# Patient Record
Sex: Male | Born: 1955 | Race: White | Hispanic: No | Marital: Married | Smoking: Former smoker
Health system: Southern US, Community
[De-identification: ages and names within clinical notes are randomized; demographics above are authoritative.]

## PROBLEM LIST (undated history)

## (undated) DIAGNOSIS — M1990 Unspecified osteoarthritis, unspecified site: Secondary | ICD-10-CM

## (undated) DIAGNOSIS — D649 Anemia, unspecified: Secondary | ICD-10-CM

## (undated) DIAGNOSIS — J309 Allergic rhinitis, unspecified: Secondary | ICD-10-CM

## (undated) DIAGNOSIS — T7840XA Allergy, unspecified, initial encounter: Secondary | ICD-10-CM

## (undated) DIAGNOSIS — D509 Iron deficiency anemia, unspecified: Secondary | ICD-10-CM

## (undated) DIAGNOSIS — K635 Polyp of colon: Secondary | ICD-10-CM

## (undated) DIAGNOSIS — K2270 Barrett's esophagus without dysplasia: Secondary | ICD-10-CM

## (undated) DIAGNOSIS — B019 Varicella without complication: Secondary | ICD-10-CM

## (undated) DIAGNOSIS — I10 Essential (primary) hypertension: Secondary | ICD-10-CM

## (undated) DIAGNOSIS — K219 Gastro-esophageal reflux disease without esophagitis: Secondary | ICD-10-CM

## (undated) DIAGNOSIS — G43909 Migraine, unspecified, not intractable, without status migrainosus: Secondary | ICD-10-CM

## (undated) DIAGNOSIS — H269 Unspecified cataract: Secondary | ICD-10-CM

## (undated) DIAGNOSIS — E785 Hyperlipidemia, unspecified: Secondary | ICD-10-CM

## (undated) DIAGNOSIS — R42 Dizziness and giddiness: Secondary | ICD-10-CM

## (undated) DIAGNOSIS — Z972 Presence of dental prosthetic device (complete) (partial): Secondary | ICD-10-CM

## (undated) HISTORY — DX: Allergy, unspecified, initial encounter: T78.40XA

## (undated) HISTORY — DX: Hyperlipidemia, unspecified: E78.5

## (undated) HISTORY — DX: Iron deficiency anemia, unspecified: D50.9

## (undated) HISTORY — DX: Migraine, unspecified, not intractable, without status migrainosus: G43.909

## (undated) HISTORY — DX: Anemia, unspecified: D64.9

## (undated) HISTORY — DX: Varicella without complication: B01.9

## (undated) HISTORY — DX: Polyp of colon: K63.5

## (undated) HISTORY — PX: UPPER GASTROINTESTINAL ENDOSCOPY: SHX188

## (undated) HISTORY — DX: Essential (primary) hypertension: I10

## (undated) HISTORY — DX: Allergic rhinitis, unspecified: J30.9

## (undated) HISTORY — PX: POLYPECTOMY: SHX149

## (undated) HISTORY — DX: Barrett's esophagus without dysplasia: K22.70

## (undated) HISTORY — PX: ESOPHAGOGASTRODUODENOSCOPY ENDOSCOPY: SHX5814

## (undated) HISTORY — DX: Unspecified cataract: H26.9

## (undated) HISTORY — DX: Unspecified osteoarthritis, unspecified site: M19.90

## (undated) HISTORY — DX: Gastro-esophageal reflux disease without esophagitis: K21.9

## (undated) HISTORY — PX: COLONOSCOPY: SHX174

---

## 2005-05-02 ENCOUNTER — Ambulatory Visit: Payer: Self-pay | Admitting: Family Medicine

## 2005-05-02 ENCOUNTER — Other Ambulatory Visit: Payer: Self-pay

## 2005-06-28 ENCOUNTER — Ambulatory Visit: Payer: Self-pay | Admitting: Internal Medicine

## 2005-09-29 ENCOUNTER — Encounter: Payer: Self-pay | Admitting: Internal Medicine

## 2005-10-23 ENCOUNTER — Encounter: Payer: Self-pay | Admitting: Internal Medicine

## 2008-01-20 ENCOUNTER — Ambulatory Visit: Payer: Self-pay | Admitting: General Practice

## 2008-01-22 ENCOUNTER — Ambulatory Visit: Payer: Self-pay | Admitting: General Practice

## 2008-02-21 ENCOUNTER — Ambulatory Visit: Payer: Self-pay | Admitting: General Practice

## 2008-03-23 ENCOUNTER — Ambulatory Visit: Payer: Self-pay | Admitting: General Practice

## 2009-12-30 ENCOUNTER — Ambulatory Visit: Payer: Self-pay

## 2010-12-23 ENCOUNTER — Ambulatory Visit: Payer: Self-pay | Admitting: Rheumatology

## 2011-02-06 ENCOUNTER — Encounter: Payer: Self-pay | Admitting: Rheumatology

## 2011-02-21 ENCOUNTER — Encounter: Payer: Self-pay | Admitting: Rheumatology

## 2011-12-05 ENCOUNTER — Other Ambulatory Visit: Payer: Self-pay | Admitting: Family Medicine

## 2011-12-05 LAB — CBC WITH DIFFERENTIAL/PLATELET
Basophil %: 0.8 %
Eosinophil #: 1.2 10*3/uL — ABNORMAL HIGH (ref 0.0–0.7)
Eosinophil %: 14.1 %
HCT: 40 % (ref 40.0–52.0)
HGB: 13.1 g/dL (ref 13.0–18.0)
Lymphocyte #: 2.9 10*3/uL (ref 1.0–3.6)
MCH: 26.8 pg (ref 26.0–34.0)
MCHC: 32.6 g/dL (ref 32.0–36.0)
MCV: 82 fL (ref 80–100)
Monocyte #: 0.6 10*3/uL (ref 0.0–0.7)
Monocyte %: 6.5 %
Neutrophil #: 4 10*3/uL (ref 1.4–6.5)
RBC: 4.86 10*6/uL (ref 4.40–5.90)

## 2011-12-05 LAB — URINALYSIS, COMPLETE
Bilirubin,UR: NEGATIVE
Glucose,UR: NEGATIVE mg/dL (ref 0–75)
Ketone: NEGATIVE
Leukocyte Esterase: NEGATIVE
Nitrite: NEGATIVE
Ph: 5 (ref 4.5–8.0)
Protein: NEGATIVE
RBC,UR: 1 /HPF (ref 0–5)
Squamous Epithelial: <1

## 2011-12-05 LAB — COMPREHENSIVE METABOLIC PANEL
Alkaline Phosphatase: 57 U/L (ref 50–136)
BUN: 10 mg/dL (ref 7–18)
Bilirubin,Total: 0.6 mg/dL (ref 0.2–1.0)
Calcium, Total: 9.5 mg/dL (ref 8.5–10.1)
Chloride: 107 mmol/L (ref 98–107)
Creatinine: 0.92 mg/dL (ref 0.60–1.30)
EGFR (African American): >60
EGFR (Non-African Amer.): >60
Potassium: 4 mmol/L (ref 3.5–5.1)
SGOT(AST): 30 U/L (ref 15–37)
SGPT (ALT): 40 U/L
Total Protein: 7.7 g/dL (ref 6.4–8.2)

## 2015-05-02 ENCOUNTER — Other Ambulatory Visit: Payer: Self-pay | Admitting: Family Medicine

## 2015-06-01 ENCOUNTER — Telehealth: Payer: Self-pay | Admitting: Family Medicine

## 2015-06-01 NOTE — Telephone Encounter (Signed)
Calling regarding PA for celecoxib.

## 2015-06-01 NOTE — Telephone Encounter (Signed)
I have not received a PA as of yet today

## 2015-06-04 ENCOUNTER — Other Ambulatory Visit: Payer: Self-pay | Admitting: Family Medicine

## 2015-06-27 ENCOUNTER — Other Ambulatory Visit: Payer: Self-pay | Admitting: Family Medicine

## 2015-10-15 ENCOUNTER — Other Ambulatory Visit: Payer: Self-pay | Admitting: Family Medicine

## 2015-11-30 ENCOUNTER — Other Ambulatory Visit: Payer: Self-pay | Admitting: Family Medicine

## 2015-12-21 ENCOUNTER — Other Ambulatory Visit: Payer: Self-pay | Admitting: Family Medicine

## 2015-12-28 ENCOUNTER — Encounter: Payer: Self-pay | Admitting: Family Medicine

## 2015-12-28 ENCOUNTER — Ambulatory Visit (INDEPENDENT_AMBULATORY_CARE_PROVIDER_SITE_OTHER): Payer: BLUE CROSS/BLUE SHIELD | Admitting: Family Medicine

## 2015-12-28 VITALS — BP 126/88 | HR 80 | Temp 98.0°F | Ht 69.0 in | Wt 215.4 lb

## 2015-12-28 DIAGNOSIS — E785 Hyperlipidemia, unspecified: Secondary | ICD-10-CM | POA: Insufficient documentation

## 2015-12-28 DIAGNOSIS — M1990 Unspecified osteoarthritis, unspecified site: Secondary | ICD-10-CM

## 2015-12-28 DIAGNOSIS — J3089 Other allergic rhinitis: Secondary | ICD-10-CM | POA: Diagnosis not present

## 2015-12-28 DIAGNOSIS — R350 Frequency of micturition: Secondary | ICD-10-CM | POA: Diagnosis not present

## 2015-12-28 DIAGNOSIS — J309 Allergic rhinitis, unspecified: Secondary | ICD-10-CM | POA: Insufficient documentation

## 2015-12-28 DIAGNOSIS — I10 Essential (primary) hypertension: Secondary | ICD-10-CM | POA: Insufficient documentation

## 2015-12-28 LAB — POCT URINALYSIS DIPSTICK
Bilirubin, UA: NEGATIVE
Blood, UA: NEGATIVE
Glucose, UA: NEGATIVE
Ketones, UA: NEGATIVE
LEUKOCYTES UA: NEGATIVE
NITRITE UA: NEGATIVE
PH UA: 7
PROTEIN UA: NEGATIVE
Spec Grav, UA: 1.01
Urobilinogen, UA: 0.2

## 2015-12-28 LAB — COMPREHENSIVE METABOLIC PANEL
ALBUMIN: 5 g/dL (ref 3.5–5.2)
ALT: 31 U/L (ref 0–53)
AST: 22 U/L (ref 0–37)
Alkaline Phosphatase: 86 U/L (ref 39–117)
BUN: 10 mg/dL (ref 6–23)
CHLORIDE: 103 meq/L (ref 96–112)
CO2: 26 meq/L (ref 19–32)
Calcium: 10.6 mg/dL — ABNORMAL HIGH (ref 8.4–10.5)
Creatinine, Ser: 0.93 mg/dL (ref 0.40–1.50)
GFR: 88.51 mL/min (ref >60.00)
Glucose, Bld: 105 mg/dL — ABNORMAL HIGH (ref 70–99)
POTASSIUM: 3.9 meq/L (ref 3.5–5.1)
SODIUM: 140 meq/L (ref 135–145)
Total Bilirubin: 0.7 mg/dL (ref 0.2–1.2)
Total Protein: 8 g/dL (ref 6.0–8.3)

## 2015-12-28 LAB — LIPID PANEL
Cholesterol: 181 mg/dL (ref 0–200)
HDL: 56.8 mg/dL (ref >39.00)
LDL Cholesterol: 89 mg/dL (ref 0–99)
NonHDL: 124.69
TRIGLYCERIDES: 177 mg/dL — AB (ref 0.0–149.0)
Total CHOL/HDL Ratio: 3
VLDL: 35.4 mg/dL (ref 0.0–40.0)

## 2015-12-28 LAB — HEMOGLOBIN A1C: HEMOGLOBIN A1C: 5.9 % (ref 4.6–6.5)

## 2015-12-28 MED ORDER — CELECOXIB 200 MG PO CAPS: 200.0000 mg | ORAL_CAPSULE | Freq: Two times a day (BID) | ORAL | Status: DC | PRN

## 2015-12-28 MED ORDER — MONTELUKAST SODIUM 10 MG PO TABS: 10.0000 mg | ORAL_TABLET | Freq: Every day | ORAL | Status: DC

## 2015-12-28 NOTE — Progress Notes (Signed)
Pre visit review using our clinic review tool, if applicable. No additional management support is needed unless otherwise documented below in the visit note. 

## 2015-12-28 NOTE — Assessment & Plan Note (Signed)
At goal. We'll continue current medications.

## 2015-12-28 NOTE — Assessment & Plan Note (Signed)
Not well-controlled. Will continue Zyrtec and Flonase. We'll add Singulair. Continue to monitor.

## 2015-12-28 NOTE — Patient Instructions (Addendum)
Nice to meet you. We will prescribe your Celebrex today. Need to monitor for stomach upset on this. You should always take it with food. We will check some lab work to evaluate urination. We will start you on Singulair for your allergies. If you develop worsening cough, or fevers, shortness of breath, chest pain, worsening urinary issues, abdominal pain, or any new or changing symptoms please seek medical attention.

## 2015-12-28 NOTE — Assessment & Plan Note (Signed)
Scattered areas of osteoarthritis. Unsure if he's ever had imaging. We will continue his Celebrex. He was advised to take this with food. He was advised to monitor for stomach upset. He was advised to continue his PPI while taking this. He will continue to monitor. He is given return precautions.

## 2015-12-28 NOTE — Progress Notes (Signed)
Patient ID: Collin Mills, male   DOB: [birth year], 60 y.o.   MRN: [id number]  Collin Rumps, MD Phone: [phone]  Collin Mills is a 60 y.o. male who presents today for new patient visit.  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 He's been taking Celebrex intermittently for this in addition to Advil. He does note some upset stomach with the Celebrex if he takes it every day. If he takes it every 2-3 days it does not bother him. Notes it helped significantly. Using his joints excessively cause some discomfort.  Frequent urination: Patient notes this is been going on for 20+ years. He additionally notes excessive thirst. He thinks it might be related to medications. No history of diabetes. No urinary stream issues. No dysuria or urgency. No hematuria.  Allergic rhinitis: Patient notes he is allergic to dust mites, mold, trees, and grass among other things. He previously took allergy shots though these did not help. Currently takes Zyrtec and Flonase. He notes he gets itchy watery eyes, rhinorrhea, and diffuse itching. Notes this is somewhat improved with Zyrtec. He notes symptoms are not well controlled on his current regimen.  HYPERTENSION Disease Monitoring Home BP Monitoring infrequently, typically 130s over 80s Chest pain- no    Dyspnea- no Medications Compliance-  taking diltiazem and losartan. Lightheadedness-  no  Edema- no  HYPERLIPIDEMIA Symptoms Chest pain on exertion:  No   Leg claudication:   No Medications: Compliance- taking pravastatin Right upper quadrant pain- no  Muscle aches- no    Active Ambulatory Problems    Diagnosis Date Noted  . Osteoarthritis 12/28/2015  . Allergic rhinitis 12/28/2015  . Frequent urination 12/28/2015  . Essential hypertension 12/28/2015  . Hyperlipidemia 12/28/2015   Resolved Ambulatory Problems    Diagnosis Date Noted  . No Resolved Ambulatory Problems   Past Medical History    Diagnosis Date  . Chickenpox   . GERD (gastroesophageal reflux disease)   . Hypertension   . Migraines     Family History  Problem Relation Age of Onset  . Lung cancer    . Stroke    . Hypertension      Social History   Social History  . Marital Status: Married    Spouse Name: N/A  . Number of Children: N/A  . Years of Education: N/A   Occupational History  . Not on file.   Social History Main Topics  . Smoking status: Former Research scientist (life sciences)  . Smokeless tobacco: Not on file  . Alcohol Use: 0.0 oz/week    0 Standard drinks or equivalent per week  . Drug Use: No  . Sexual Activity: Not on file   Other Topics Concern  . Not on file   Social History Narrative  . No narrative on file    ROS   General:  Negative for nexplained weight loss, fever Skin: Negative for new or changing mole, sore that won't heal HEENT: Negative for trouble hearing, trouble seeing, ringing in ears, mouth sores, hoarseness, change in voice, dysphagia. CV:  Negative for chest pain, dyspnea, edema, palpitations Resp: Negative for cough, dyspnea, hemoptysis GI: Negative for nausea, vomiting, diarrhea, constipation, abdominal pain, melena, hematochezia. GU: Positive for frequent urination, Negative for dysuria, incontinence, urinary hesitance, hematuria, vaginal or penile discharge, polyuria, sexual difficulty, lumps in testicle or breasts MSK: Negative for muscle cramps or aches, joint pain or swelling Neuro: Negative for headaches, weakness, numbness, dizziness, passing out/fainting Psych: Negative for depression, anxiety, memory problems  Endo: Positive for excessive thirst, frequent urination  Objective  Physical Exam Filed Vitals:   12/28/15 0933  BP: 126/88  Pulse: 80  Temp: 98 F (36.7 C)    BP Readings from Last 3 Encounters:  12/28/15 126/88   Wt Readings from Last 3 Encounters:  12/28/15 215 lb 6.4 oz (97.705 kg)    Physical Exam  Constitutional: He is well-developed,  well-nourished, and in no distress.  HENT:  Head: Normocephalic and atraumatic.  Right Ear: External ear normal.  Left Ear: External ear normal.  Mouth/Throat: Oropharynx is clear and moist. No oropharyngeal exudate.  Normal TMs bilaterally  Eyes: Conjunctivae are normal. Pupils are equal, round, and reactive to light.  Neck: Neck supple.  Cardiovascular: Normal rate, regular rhythm and normal heart sounds.  Exam reveals no gallop and no friction rub.   No murmur heard. Pulmonary/Chest: Effort normal and breath sounds normal. No respiratory distress. He has no wheezes. He has no rales.  Abdominal: Soft. Bowel sounds are normal. He exhibits no distension. There is no tenderness. There is no rebound and no guarding.  Musculoskeletal: He exhibits no edema.  Mild DIP joint tenderness with no swelling in bilateral hands, no other joint tenderness, no joint swelling in his hands, bilateral knees with no tenderness, swelling, erythema, or warmth, negative McMurray's bilaterally, no ligamentous laxity  Lymphadenopathy:    He has no cervical adenopathy.  Neurological: He is alert. Gait normal.  Skin: Skin is warm and dry. He is not diaphoretic.  Psychiatric: Mood and affect normal.     Assessment/Plan:   Osteoarthritis Scattered areas of osteoarthritis. Unsure if he's ever had imaging. We will continue his Celebrex. He was advised to take this with food. He was advised to monitor for stomach upset. He was advised to continue his PPI while taking this. He will continue to monitor. He is given return precautions.  Allergic rhinitis Not well-controlled. Will continue Zyrtec and Flonase. We'll add Singulair. Continue to monitor.  Frequent urination Chronic issue. We'll check a UA and A1c. Also check CMP. Given chronicity continue to monitor.  Essential hypertension At goal. We'll continue current medications.  Hyperlipidemia Tolerating medication. We'll check lipid panel today.    Orders  Placed This Encounter  Procedures  . Comp Met (CMET)  . HgB A1c  . Lipid Profile  . POCT Urinalysis Dipstick    Eric Sonnenberg, MD [facility] [place]   

## 2015-12-28 NOTE — Assessment & Plan Note (Signed)
Tolerating medication. We'll check lipid panel today.

## 2015-12-28 NOTE — Assessment & Plan Note (Addendum)
Chronic issue. We'll check a UA and A1c. Also check CMP. Given chronicity continue to monitor.

## 2016-01-02 ENCOUNTER — Other Ambulatory Visit: Payer: Self-pay | Admitting: Family Medicine

## 2016-01-03 ENCOUNTER — Telehealth: Payer: Self-pay

## 2016-01-03 NOTE — Telephone Encounter (Signed)
PA started on Celecoxib on cover my meds.

## 2016-01-04 NOTE — Telephone Encounter (Signed)
PA approved from 01/03/2016-10/22/2038

## 2016-01-11 ENCOUNTER — Other Ambulatory Visit (INDEPENDENT_AMBULATORY_CARE_PROVIDER_SITE_OTHER): Payer: BLUE CROSS/BLUE SHIELD

## 2016-01-11 LAB — CALCIUM: Calcium: 10.2 mg/dL (ref 8.4–10.5)

## 2016-01-12 ENCOUNTER — Encounter: Payer: Self-pay | Admitting: Family Medicine

## 2016-01-27 ENCOUNTER — Other Ambulatory Visit: Payer: Self-pay | Admitting: Family Medicine

## 2016-01-28 ENCOUNTER — Encounter: Payer: Self-pay | Admitting: Family Medicine

## 2016-01-28 ENCOUNTER — Other Ambulatory Visit: Payer: Self-pay | Admitting: Family Medicine

## 2016-01-28 MED ORDER — LOSARTAN POTASSIUM 100 MG PO TABS: ORAL_TABLET | ORAL | Status: DC

## 2016-01-28 NOTE — Telephone Encounter (Signed)
Losartan was sent to you for approval due to historical medication. Please advise referral for arthritis.

## 2016-01-28 NOTE — Telephone Encounter (Signed)
Refill sent to pharmacy.   

## 2016-01-28 NOTE — Telephone Encounter (Signed)
Historical medication. Please advise?

## 2016-02-29 ENCOUNTER — Other Ambulatory Visit: Payer: Self-pay | Admitting: Family Medicine

## 2016-03-07 ENCOUNTER — Encounter: Payer: Self-pay | Admitting: Family Medicine

## 2016-04-03 ENCOUNTER — Encounter: Payer: Self-pay | Admitting: Family Medicine

## 2016-04-03 ENCOUNTER — Ambulatory Visit (INDEPENDENT_AMBULATORY_CARE_PROVIDER_SITE_OTHER): Payer: BLUE CROSS/BLUE SHIELD | Admitting: Family Medicine

## 2016-04-03 VITALS — BP 136/84 | HR 76 | Temp 98.2°F | Ht 69.0 in | Wt 213.2 lb

## 2016-04-03 DIAGNOSIS — E785 Hyperlipidemia, unspecified: Secondary | ICD-10-CM

## 2016-04-03 DIAGNOSIS — I10 Essential (primary) hypertension: Secondary | ICD-10-CM

## 2016-04-03 DIAGNOSIS — Z1211 Encounter for screening for malignant neoplasm of colon: Secondary | ICD-10-CM

## 2016-04-03 DIAGNOSIS — R600 Localized edema: Secondary | ICD-10-CM

## 2016-04-03 DIAGNOSIS — J3089 Other allergic rhinitis: Secondary | ICD-10-CM | POA: Diagnosis not present

## 2016-04-03 DIAGNOSIS — M7989 Other specified soft tissue disorders: Secondary | ICD-10-CM

## 2016-04-03 DIAGNOSIS — R198 Other specified symptoms and signs involving the digestive system and abdomen: Secondary | ICD-10-CM

## 2016-04-03 DIAGNOSIS — R109 Unspecified abdominal pain: Secondary | ICD-10-CM | POA: Diagnosis not present

## 2016-04-03 LAB — COMPREHENSIVE METABOLIC PANEL
ALBUMIN: 4.7 g/dL (ref 3.5–5.2)
ALT: 22 U/L (ref 0–53)
AST: 17 U/L (ref 0–37)
Alkaline Phosphatase: 63 U/L (ref 39–117)
BUN: 10 mg/dL (ref 6–23)
CHLORIDE: 105 meq/L (ref 96–112)
CO2: 27 mEq/L (ref 19–32)
Calcium: 9.7 mg/dL (ref 8.4–10.5)
Creatinine, Ser: 0.95 mg/dL (ref 0.40–1.50)
GFR: 86.28 mL/min (ref >60.00)
GLUCOSE: 97 mg/dL (ref 70–99)
POTASSIUM: 4.1 meq/L (ref 3.5–5.1)
SODIUM: 141 meq/L (ref 135–145)
Total Bilirubin: 0.6 mg/dL (ref 0.2–1.2)
Total Protein: 7.7 g/dL (ref 6.0–8.3)

## 2016-04-03 LAB — CBC
HEMATOCRIT: 42.9 % (ref 39.0–52.0)
Hemoglobin: 14.4 g/dL (ref 13.0–17.0)
MCHC: 33.6 g/dL (ref 30.0–36.0)
MCV: 87.8 fl (ref 78.0–100.0)
Platelets: 278 10*3/uL (ref 150.0–400.0)
RBC: 4.88 Mil/uL (ref 4.22–5.81)
RDW: 14.2 % (ref 11.5–15.5)
WBC: 8.9 10*3/uL (ref 4.0–10.5)

## 2016-04-03 LAB — LIPASE: LIPASE: 26 U/L (ref 11.0–59.0)

## 2016-04-03 LAB — TSH: TSH: 1.47 u[IU]/mL (ref 0.35–4.50)

## 2016-04-03 LAB — SEDIMENTATION RATE: Sed Rate: 1 mm/hr (ref 0–20)

## 2016-04-03 MED ORDER — CETIRIZINE HCL 10 MG PO TABS: 10.0000 mg | ORAL_TABLET | Freq: Every day | ORAL | Status: DC

## 2016-04-03 NOTE — Assessment & Plan Note (Addendum)
Last cholesterol panel well-controlled. Continue current dosing of pravastatin. Could consider changing this medication the future if it continues to make him feel hungry.

## 2016-04-03 NOTE — Assessment & Plan Note (Signed)
At goal. Continue current medications. 

## 2016-04-03 NOTE — Assessment & Plan Note (Signed)
Patient reports a sensation of abdominal fullness. Benign exam today. Could be related to excessive adipose tissue over his stomach. Could be musculoskeletal. We will refer him for colonoscopy as he is due for one. We will obtain lab work as outlined below to work this up further. He is given return precautions.

## 2016-04-03 NOTE — Assessment & Plan Note (Signed)
Suspect swelling he gets is related to being on his feet for prolonged periods of time. No cardiac symptoms. Could be anemia related as he has a history of this. No swelling of this time. Could also be related to diltiazem. We'll check lab work as outlined below. If becomes more frequent could consider changing diltiazem.

## 2016-04-03 NOTE — Patient Instructions (Signed)
Nice to see you. We will refill your Zyrtec. We will obtain some lab work to evaluate the fullness sensation having your stomach. We will refer you to GI for colonoscopy as well. Please continue your current blood pressure medications and cholesterol medications. If you develop abdominal pain, fevers, nausea, vomiting, diarrhea, blood in your stool, or any new or changing symptoms please seek medical attention.

## 2016-04-03 NOTE — Assessment & Plan Note (Signed)
Stable. Refill for Zyrtec given. Does note some itching skin with water getting on his skin. We will check a CBC to ensure no polycythemia. He'll continue his current medications.

## 2016-04-03 NOTE — Progress Notes (Signed)
Pre visit review using our clinic review tool, if applicable. No additional management support is needed unless otherwise documented below in the visit note. 

## 2016-04-03 NOTE — Progress Notes (Signed)
Patient ID: Collin Mills, male   DOB: Jul 19, 1956, 60 y.o.   MRN: [id number]  [name], MD Phone: [phone]  Collin Mills is a 60 y.o. male who presents today for f/u.  HYPERTENSION Disease Monitoring: Blood pressure range-130-140/80-90 Chest pain, palpitations- no      Dyspnea- no Medications: Compliance- taking diltiazem and losartan   Edema- minimal ankle edema infrequently if on his feet for too long resolving with propping legs. No orthopnea or PND. Does note a history of anemia. No thyroid dysfunction, kidney dysfunction, or liver dysfunction to his knowledge.  HYPERLIPIDEMIA Disease Monitoring: See symptoms for Hypertension Medications: Compliance- taking pravastatin every other day due to if he takes it every day makes him ravenous Right upper quadrant pain- no  Muscle aches- no  Allergies: He notes minimally watery eyes. Some rhinorrhea. Notes his skin itches. Zyrtec and Benadryl helps somewhat this. Notes his skin itches particularly if it gets wet. This has been going on for his whole life. He has seen an allergist for evaluation of this and they noted no specific abnormalities.  Abdominal fullness: Patient notes for the last 6-12 months he has had a sensation of a fullness in a band distribution at the level of his umbilicus. Can occur when sitting up or walking. He notes no abdominal pain. Has a bowel movement daily. Does note he has hemorrhoids and does have infrequent minimal bleeding with this. Last colonoscopy was 10 years ago and reports no polyps. No nausea or vomiting. No dysuria. No family history of colon cancer. No early satiety.   PMH: Former smoker   ROS see history of present illness  Objective  Physical Exam Filed Vitals:   04/03/16 1055  BP: 136/84  Pulse: 76  Temp: 98.2 F (36.8 C)    BP Readings from Last 3 Encounters:  04/03/16 136/84  12/28/15 126/88   Wt Readings from Last 3 Encounters:  04/03/16 213 lb 3.2 oz (96.707  kg)  12/28/15 215 lb 6.4 oz (97.705 kg)    Physical Exam  Constitutional: He is well-developed, well-nourished, and in no distress.  HENT:  Head: Normocephalic and atraumatic.  Right Ear: External ear normal.  Left Ear: External ear normal.  Mouth/Throat: Oropharynx is clear and moist. No oropharyngeal exudate.  Normal TMs bilaterally  Eyes: Conjunctivae are normal. Pupils are equal, round, and reactive to light.  Cardiovascular: Normal rate, regular rhythm and normal heart sounds.   Pulmonary/Chest: Effort normal and breath sounds normal.  Abdominal: Soft. Bowel sounds are normal. He exhibits no distension. There is no tenderness. There is no rebound and no guarding.  Musculoskeletal: He exhibits no edema.  Neurological: He is alert. Gait normal.  Skin: Skin is warm and dry. He is not diaphoretic.     Assessment/Plan: Please see individual problem list.  Allergic rhinitis Stable. Refill for Zyrtec given. Does note some itching skin with water getting on his skin. We will check a CBC to ensure no polycythemia. He'll continue his current medications.  Essential hypertension At goal. Continue current medications.  Hyperlipidemia Last cholesterol panel well-controlled. Continue current dosing of pravastatin. Could consider changing this medication the future if it continues to make him feel hungry.  Bilateral lower extremity edema Suspect swelling he gets is related to being on his feet for prolonged periods of time. No cardiac symptoms. Could be anemia related as he has a history of this. No swelling of this time. Could also be related to diltiazem. We'll check lab work as outlined  below. If becomes more frequent could consider changing diltiazem.  Abdominal fullness Patient reports a sensation of abdominal fullness. Benign exam today. Could be related to excessive adipose tissue over his stomach. Could be musculoskeletal. We will refer him for colonoscopy as he is due for one. We  will obtain lab work as outlined below to work this up further. He is given return precautions.    Orders Placed This Encounter  Procedures  . CBC  . Comp Met (CMET)  . Lipase  . Sed Rate (ESR)  . TSH  . Ambulatory referral to Gastroenterology    Referral Priority:  Routine    Referral Type:  Consultation    Referral Reason:  Specialty Services Required    Number of Visits Requested:  1    Meds ordered this encounter  Medications  . cetirizine (ZYRTEC) 10 MG tablet    Sig: Take 1 tablet (10 mg total) by mouth daily.    Dispense:  90 tablet    Refill:  [place], MD [place]

## 2016-04-04 ENCOUNTER — Encounter: Payer: Self-pay | Admitting: Gastroenterology

## 2016-04-29 ENCOUNTER — Other Ambulatory Visit: Payer: Self-pay | Admitting: Family Medicine

## 2016-05-01 ENCOUNTER — Encounter: Payer: Self-pay | Admitting: Family Medicine

## 2016-05-01 ENCOUNTER — Other Ambulatory Visit: Payer: Self-pay | Admitting: Family Medicine

## 2016-05-01 MED ORDER — OMEPRAZOLE 40 MG PO CPDR: 40.0000 mg | DELAYED_RELEASE_CAPSULE | Freq: Every day | ORAL | Status: DC

## 2016-05-01 MED ORDER — PRAVASTATIN SODIUM 40 MG PO TABS: 40.0000 mg | ORAL_TABLET | Freq: Every day | ORAL | Status: DC

## 2016-05-02 ENCOUNTER — Other Ambulatory Visit: Payer: Self-pay | Admitting: Family Medicine

## 2016-05-28 ENCOUNTER — Other Ambulatory Visit: Payer: Self-pay | Admitting: Family Medicine

## 2016-05-29 ENCOUNTER — Telehealth: Payer: Self-pay

## 2016-05-29 ENCOUNTER — Ambulatory Visit (AMBULATORY_SURGERY_CENTER): Payer: BLUE CROSS/BLUE SHIELD

## 2016-05-29 VITALS — Ht 68.5 in | Wt 217.2 lb

## 2016-05-29 DIAGNOSIS — Z1211 Encounter for screening for malignant neoplasm of colon: Secondary | ICD-10-CM

## 2016-05-29 MED ORDER — SUPREP BOWEL PREP KIT 17.5-3.13-1.6 GM/177ML PO SOLN
1.0000 | Freq: Once | ORAL | 0 refills | Status: AC
Start: 2016-05-29 — End: 2016-05-29

## 2016-05-29 NOTE — Telephone Encounter (Signed)
Dr Silverio Decamp,     Pt had colon and egd in 2005 @ [facility] by Leonia Corona.  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  Has not had a repeat EGD since.  Scheduled for colon with you on Monday 8/21st.  Would you like to add EGD to colon if pt agrees?                                            Thanks, Ayeza Therriault//PV

## 2016-05-29 NOTE — Progress Notes (Signed)
No allergies to eggs or soy No past problems with anesthesia No diet meds No home oxygen  Declined emmi

## 2016-05-30 ENCOUNTER — Encounter: Payer: Self-pay | Admitting: Gastroenterology

## 2016-05-30 NOTE — Telephone Encounter (Signed)
Spoke with pt.Ok to have endoscopy with the colonoscopy per Dr Coralee Rud rescheduled for endo/colon on 06/12/16 at 11:00am.Pt was informed he would need to sign consent form for endoscopy.He understood.Reviewed prep instructions.Pt will call back if he has further questions.

## 2016-05-30 NOTE — Telephone Encounter (Signed)
Ok to add EGD. Thanks

## 2016-06-02 ENCOUNTER — Telehealth: Payer: Self-pay | Admitting: Gastroenterology

## 2016-06-02 NOTE — Telephone Encounter (Signed)
Appointment changed to colonoscopy. EGD cancelled. Patient advised to come in at 9:30 am instead of the previously planned time.

## 2016-06-12 ENCOUNTER — Ambulatory Visit (AMBULATORY_SURGERY_CENTER): Payer: BLUE CROSS/BLUE SHIELD | Admitting: Gastroenterology

## 2016-06-12 ENCOUNTER — Encounter: Payer: Self-pay | Admitting: Gastroenterology

## 2016-06-12 ENCOUNTER — Other Ambulatory Visit: Payer: Self-pay | Admitting: Surgical

## 2016-06-12 ENCOUNTER — Encounter: Payer: BLUE CROSS/BLUE SHIELD | Admitting: Gastroenterology

## 2016-06-12 ENCOUNTER — Encounter: Payer: Self-pay | Admitting: Family Medicine

## 2016-06-12 VITALS — BP 122/77 | HR 72 | Temp 99.1°F | Resp 15 | Ht 68.5 in | Wt 217.0 lb

## 2016-06-12 DIAGNOSIS — D123 Benign neoplasm of transverse colon: Secondary | ICD-10-CM | POA: Diagnosis not present

## 2016-06-12 DIAGNOSIS — D124 Benign neoplasm of descending colon: Secondary | ICD-10-CM | POA: Diagnosis not present

## 2016-06-12 DIAGNOSIS — Z1211 Encounter for screening for malignant neoplasm of colon: Secondary | ICD-10-CM | POA: Diagnosis not present

## 2016-06-12 MED ORDER — MONTELUKAST SODIUM 10 MG PO TABS: ORAL_TABLET | ORAL | 0 refills | Status: DC

## 2016-06-12 MED ORDER — SODIUM CHLORIDE 0.9 % IV SOLN: 500.0000 mL | INTRAVENOUS | Status: DC

## 2016-06-12 MED ORDER — DILTIAZEM HCL ER COATED BEADS 240 MG PO CP24: 240.0000 mg | ORAL_CAPSULE | Freq: Every day | ORAL | 0 refills | Status: DC

## 2016-06-12 NOTE — Progress Notes (Signed)
Report given to PACU RN, vss

## 2016-06-12 NOTE — Progress Notes (Signed)
Called to room to assist during endoscopic procedure.  Patient ID and intended procedure confirmed with present staff. Received instructions for my participation in the procedure from the performing physician.  

## 2016-06-12 NOTE — Op Note (Signed)
[facility] Patient Name: Collin Mills Procedure Date: 06/12/2016 10:13 AM MRN: [id number] Endoscopist: Mauri Pole , MD Age: 60 Referring MD:  Date of Birth: 10/06/1956 Gender: Male Account #: [id number] Procedure:                Colonoscopy Indications:              Screening for colorectal malignant neoplasm Medicines:                Monitored Anesthesia Care Procedure:                Pre-Anesthesia Assessment:                           - Prior to the procedure, a History and Physical                            was performed, and patient medications and                            allergies were reviewed. The patient's tolerance of                            previous anesthesia was also reviewed. The risks                            and benefits of the procedure and the sedation                            options and risks were discussed with the patient.                            All questions were answered, and informed consent                            was obtained. Prior Anticoagulants: The patient has                            taken no previous anticoagulant or antiplatelet                            agents. ASA Grade Assessment: II - A patient with                            mild systemic disease. After reviewing the risks                            and benefits, the patient was deemed in                            satisfactory condition to undergo the procedure.                           After obtaining informed consent, the colonoscope  was passed under direct vision. Throughout the                            procedure, the patient's blood pressure, pulse, and                            oxygen saturations were monitored continuously. The                            Model CF-HQ190L [id number]) scope was introduced                            through the anus and advanced to the the cecum,                            identified  by appendiceal orifice and ileocecal                            valve. The colonoscopy was performed without                            difficulty. The patient tolerated the procedure                            well. The quality of the bowel preparation was                            good. The ileocecal valve, appendiceal orifice, and                            rectum were photographed. Scope In: 10:27:05 AM Scope Out: 10:48:08 AM Scope Withdrawal Time: 0 hours 15 minutes 54 seconds  Total Procedure Duration: 0 hours 21 minutes 3 seconds  Findings:                 The perianal and digital rectal examinations were                            normal.                           Two sessile polyps were found in the descending                            colon and transverse colon. The polyps were 5 to 9                            mm in size. These polyps were removed with a hot                            snare. Resection and retrieval were complete.                           Two sessile polyps were found in the descending  colon and transverse colon. The polyps were 4 to 6                            mm in size. These polyps were removed with a cold                            snare. Resection and retrieval were complete.                           Two sessile polyps were found in the descending                            colon. The polyps were 2 to 3 mm in size. These                            polyps were removed with a cold biopsy forceps.                            Resection and retrieval were complete.                           Non-bleeding internal hemorrhoids were found during                            retroflexion. The hemorrhoids were small.                           The exam was otherwise without abnormality. Complications:            No immediate complications. Estimated Blood Loss:     Estimated blood loss was minimal. Impression:               - Two 5 to 9  mm polyps in the descending colon and                            in the transverse colon, removed with a hot snare.                            Resected and retrieved.                           - Two 4 to 6 mm polyps in the descending colon and                            in the transverse colon, removed with a cold snare.                            Resected and retrieved.                           - Two 2 to 3 mm polyps in the descending colon,  removed with a cold biopsy forceps. Resected and                            retrieved.                           - Non-bleeding internal hemorrhoids.                           - The examination was otherwise normal. Recommendation:           - Patient has a contact number available for                            emergencies. The signs and symptoms of potential                            delayed complications were discussed with the                            patient. Return to normal activities tomorrow.                            Written discharge instructions were provided to the                            patient.                           - Resume previous diet.                           - Continue present medications.                           - Await pathology results.                           - Repeat colonoscopy in 3 - 5 years for                            surveillance based on pathology results.                           - Return to GI clinic PRN. Mauri Pole, MD 06/12/2016 10:58:34 AM This report has been signed electronically.

## 2016-06-12 NOTE — Patient Instructions (Signed)
YOU HAD AN ENDOSCOPIC PROCEDURE TODAY AT [facility]:   Refer to the procedure report that was given to you for any specific questions about what was found during the examination.  If the procedure report does not answer your questions, please call your gastroenterologist to clarify.  If you requested that your care partner not be given the details of your procedure findings, then the procedure report has been included in a sealed envelope for you to review at your convenience later.  YOU SHOULD EXPECT: Some feelings of bloating in the abdomen. Passage of more gas than usual.  Walking can help get rid of the air that was put into your GI tract during the procedure and reduce the bloating. If you had a lower endoscopy (such as a colonoscopy or flexible sigmoidoscopy) you may notice spotting of blood in your stool or on the toilet paper. If you underwent a bowel prep for your procedure, you may not have a normal bowel movement for a few days.  Please Note:  You might notice some irritation and congestion in your nose or some drainage.  This is from the oxygen used during your procedure.  There is no need for concern and it should clear up in a day or so.  SYMPTOMS TO REPORT IMMEDIATELY:   Following lower endoscopy (colonoscopy or flexible sigmoidoscopy):  Excessive amounts of blood in the stool  Significant tenderness or worsening of abdominal pains  Swelling of the abdomen that is new, acute  Fever of 100F or higher  For urgent or emergent issues, a gastroenterologist can be reached at any hour by calling [phone].   DIET:  We do recommend a small meal at first, but then you may proceed to your regular diet.  Drink plenty of fluids but you should avoid alcoholic beverages for 24 hours.  ACTIVITY:  You should plan to take it easy for the rest of today and you should NOT DRIVE or use heavy machinery until tomorrow (because of the sedation medicines used during the test).     FOLLOW UP: Our staff will call the number listed on your records the next business day following your procedure to check on you and address any questions or concerns that you may have regarding the information given to you following your procedure. If we do not reach you, we will leave a message.  However, if you are feeling well and you are not experiencing any problems, there is no need to return our call.  We will assume that you have returned to your regular daily activities without incident.  If any biopsies were taken you will be contacted by phone or by letter within the next 1-3 weeks.  Please call us at [phone] if you have not heard about the biopsies in 3 weeks.    SIGNATURES/CONFIDENTIALITY: You and/or your care partner have signed paperwork which will be entered into your electronic medical record.  These signatures attest to the fact that that the information above on your After Visit Summary has been reviewed and is understood.  Full responsibility of the confidentiality of this discharge information lies with you and/or your care-partner.  Hemorrhoid banding handout provided. Please read polyp and hemorrhoid handouts provided.

## 2016-06-12 NOTE — Progress Notes (Signed)
Patient reported having h/o ?barrett's esophagus based on EGD done at [facility] 12 years ago. His insurance will cover the procedure as diagnostic and patient had higher deductible and he opted not to undergo EGD. Discussed in detail with patient that if he did have Barrett's esophagus, he will need surveillance Will need to obtain records of prior procedures, patient said he has a copy and will mail it.  Damaris Hippo , MD [phone] Mon-Fri 8a-5p [phone] after 5p, weekends, holidays

## 2016-06-13 ENCOUNTER — Telehealth: Payer: Self-pay

## 2016-06-13 NOTE — Telephone Encounter (Signed)
  Follow up Call-  Call back number 06/12/2016  Post procedure Call Back phone  # [phone]  Permission to leave phone message Yes  Some recent data might be hidden     Patient questions:  Do you have a fever, pain , or abdominal swelling? No. Pain Score  0 *  Have you tolerated food without any problems? Yes.    Have you been able to return to your normal activities? Yes.    Do you have any questions about your discharge instructions: Diet   No. Medications  No. Follow up visit  No.  Do you have questions or concerns about your Care? No.  Actions: * If pain score is 4 or above: No action needed, pain <4.

## 2016-06-16 ENCOUNTER — Encounter: Payer: Self-pay | Admitting: Gastroenterology

## 2016-07-26 ENCOUNTER — Other Ambulatory Visit: Payer: Self-pay | Admitting: Family Medicine

## 2016-08-02 ENCOUNTER — Ambulatory Visit (INDEPENDENT_AMBULATORY_CARE_PROVIDER_SITE_OTHER): Payer: BLUE CROSS/BLUE SHIELD | Admitting: Family Medicine

## 2016-08-02 ENCOUNTER — Encounter: Payer: Self-pay | Admitting: Family Medicine

## 2016-08-02 VITALS — BP 136/78 | HR 79 | Temp 98.5°F | Wt 223.4 lb

## 2016-08-02 DIAGNOSIS — R059 Cough, unspecified: Secondary | ICD-10-CM | POA: Insufficient documentation

## 2016-08-02 DIAGNOSIS — E785 Hyperlipidemia, unspecified: Secondary | ICD-10-CM | POA: Diagnosis not present

## 2016-08-02 DIAGNOSIS — I10 Essential (primary) hypertension: Secondary | ICD-10-CM

## 2016-08-02 DIAGNOSIS — R05 Cough: Secondary | ICD-10-CM | POA: Insufficient documentation

## 2016-08-02 DIAGNOSIS — E6609 Other obesity due to excess calories: Secondary | ICD-10-CM

## 2016-08-02 DIAGNOSIS — E669 Obesity, unspecified: Secondary | ICD-10-CM | POA: Insufficient documentation

## 2016-08-02 LAB — COMPREHENSIVE METABOLIC PANEL
ALK PHOS: 63 U/L (ref 39–117)
ALT: 32 U/L (ref 0–53)
AST: 22 U/L (ref 0–37)
Albumin: 4.5 g/dL (ref 3.5–5.2)
BILIRUBIN TOTAL: 0.4 mg/dL (ref 0.2–1.2)
BUN: 12 mg/dL (ref 6–23)
CO2: 24 meq/L (ref 19–32)
CREATININE: 1.24 mg/dL (ref 0.40–1.50)
Calcium: 9.7 mg/dL (ref 8.4–10.5)
Chloride: 106 mEq/L (ref 96–112)
GFR: 63.38 mL/min (ref >60.00)
GLUCOSE: 103 mg/dL — AB (ref 70–99)
Potassium: 4 mEq/L (ref 3.5–5.1)
SODIUM: 140 meq/L (ref 135–145)
TOTAL PROTEIN: 7.2 g/dL (ref 6.0–8.3)

## 2016-08-02 LAB — HEMOGLOBIN A1C: Hgb A1c MFr Bld: 5.6 % (ref 4.6–6.5)

## 2016-08-02 NOTE — Progress Notes (Signed)
Pre visit review using our clinic review tool, if applicable. No additional management support is needed unless otherwise documented below in the visit note. 

## 2016-08-02 NOTE — Assessment & Plan Note (Signed)
Patient has gained some weight since our last visit. Suspect discomfort he has when sitting up is related to his abdominal fat. Prior workup unremarkable. Discussed diet and exercise. He does seem to eat fairly healthily when he eats out though I did remind him that he is eating out and there are hidden calories at restaurants. I encouraged him to eat at home if possible. Discussed exercise as well.

## 2016-08-02 NOTE — Progress Notes (Signed)
  [name], MD Phone: [phone]  Collin Mills is a 60 y.o. male who presents today for follow-up.  Cough: Patient notes for the last week or so he's had a cough. Notes some sick contacts at work. He notes no upper respiratory symptoms. No reflux symptoms. No shortness of breath. No fevers. Just has cough. Nonproductive.  HYPERTENSION Disease Monitoring: Blood pressure range-not checking Chest pain- no      Dyspnea- no Medications: Compliance- taking diltiazem, losartan   Edema- no  HYPERLIPIDEMIA Disease Monitoring: See symptoms for Hypertension Medications: Compliance- taking pravastatin most days Right upper quadrant pain- no  Muscle aches- no  Obesity: Patient has put on about 6 pounds since we last saw each other. He notes his belly fat is uncomfortable when he sits though there is no discomfort at any other time. He notes he eats fairly healthfully though he does eat out all the time. Typically will get a protein and sweet potato and vegetable or a salad with a little ranch dressing on it when he eats out. Does not cook at home very much. Does not do any specific exercise though does get greater than 10,000 steps a day.   PMH: Former smoker   ROS see history of present illness  Objective  Physical Exam Vitals:   08/02/16 1330  BP: 136/78  Pulse: 79  Temp: 98.5 F (36.9 C)    BP Readings from Last 3 Encounters:  08/02/16 136/78  06/12/16 122/77  04/03/16 136/84   Wt Readings from Last 3 Encounters:  08/02/16 223 lb 6.4 oz (101.3 kg)  06/12/16 217 lb (98.4 kg)  05/29/16 217 lb 3.2 oz (98.5 kg)    Physical Exam  Constitutional: He is well-developed, well-nourished, and in no distress.  HENT:  Head: Normocephalic and atraumatic.  Mouth/Throat: Oropharynx is clear and moist. No oropharyngeal exudate.  Eyes: Conjunctivae are normal. Pupils are equal, round, and reactive to light.  Cardiovascular: Normal rate, regular rhythm and normal heart  sounds.   Pulmonary/Chest: Effort normal and breath sounds normal.  Neurological: He is alert. Gait normal.  Skin: Skin is warm and dry.     Assessment/Plan: Please see individual problem list.  Cough Suspect related to viral illness given the contacts. Though he has no other symptoms. Could potentially be silent reflux as well. Currently no reflux symptoms or allergy symptoms. Advised to continue to monitor. Over-the-counter cough suppressants. If not improving in the next 1-2 weeks he will let us know. Given return precautions.  Hyperlipidemia Tolerating medications. Check CMP.  Essential hypertension At goal. Continue current medications. Check CMP.  Obesity Patient has gained some weight since our last visit. Suspect discomfort he has when sitting up is related to his abdominal fat. Prior workup unremarkable. Discussed diet and exercise. He does seem to eat fairly healthily when he eats out though I did remind him that he is eating out and there are hidden calories at restaurants. I encouraged him to eat at home if possible. Discussed exercise as well.   Orders Placed This Encounter  Procedures  . Comp Met (CMET)  . HgB A1c   [name], MD [place]

## 2016-08-02 NOTE — Patient Instructions (Signed)
Nice to see you. Please monitor your cough. If it does not improve please let us know. We'll check some lab work and call with results. Please work on diet and exercise as we discussed. If you develop shortness of breath, cough productive of blood, or fevers please seek medical attention immediately.

## 2016-08-02 NOTE — Assessment & Plan Note (Signed)
Suspect related to viral illness given the contacts. Though he has no other symptoms. Could potentially be silent reflux as well. Currently no reflux symptoms or allergy symptoms. Advised to continue to monitor. Over-the-counter cough suppressants. If not improving in the next 1-2 weeks he will let us know. Given return precautions.

## 2016-08-02 NOTE — Assessment & Plan Note (Signed)
Tolerating medications. Check CMP.

## 2016-08-02 NOTE — Assessment & Plan Note (Signed)
At goal. Continue current medications. Check CMP.

## 2016-08-03 ENCOUNTER — Other Ambulatory Visit: Payer: Self-pay | Admitting: *Deleted

## 2016-08-03 MED ORDER — OMEPRAZOLE 40 MG PO CPDR: 40.0000 mg | DELAYED_RELEASE_CAPSULE | Freq: Every day | ORAL | 3 refills | Status: DC

## 2016-08-04 ENCOUNTER — Other Ambulatory Visit: Payer: Self-pay | Admitting: Family Medicine

## 2016-08-23 ENCOUNTER — Ambulatory Visit (INDEPENDENT_AMBULATORY_CARE_PROVIDER_SITE_OTHER): Payer: BLUE CROSS/BLUE SHIELD | Admitting: Family Medicine

## 2016-08-23 ENCOUNTER — Encounter: Payer: Self-pay | Admitting: Family Medicine

## 2016-08-23 DIAGNOSIS — R059 Cough, unspecified: Secondary | ICD-10-CM

## 2016-08-23 DIAGNOSIS — R05 Cough: Secondary | ICD-10-CM | POA: Diagnosis not present

## 2016-08-23 MED ORDER — HYDROCODONE-HOMATROPINE 5-1.5 MG/5ML PO SYRP: 5.0000 mL | ORAL_SOLUTION | Freq: Three times a day (TID) | ORAL | 0 refills | Status: DC | PRN

## 2016-08-23 MED ORDER — DOXYCYCLINE HYCLATE 100 MG PO TABS: 100.0000 mg | ORAL_TABLET | Freq: Two times a day (BID) | ORAL | 0 refills | Status: DC

## 2016-08-23 NOTE — Assessment & Plan Note (Signed)
Patient with continued symptoms. Suspect likely bronchitis and sinus infection potentially. We will treat with doxycycline to cover for both. Hycodan for cough. Return precautions in AVS.

## 2016-08-23 NOTE — Patient Instructions (Signed)
Nice to see you. We'll treat you with doxycycline for possible bronchitis and sinus infection. You can use Hycodan for cough. This may make you drowsy so be wary when you take it. If you develop a cough productive of blood, fevers, shortness of breath, or any new or changing symptoms please seek medical attention.

## 2016-08-23 NOTE — Progress Notes (Signed)
Pre visit review using our clinic review tool, if applicable. No additional management support is needed unless otherwise documented below in the visit note. 

## 2016-08-23 NOTE — Progress Notes (Signed)
  [name], MD Phone: [phone]  Collin Mills is a 60 y.o. male who presents today for same day visit.  Patient notes about a months worth of cough. Over the last 1-1/2 weeks he has developed sinus congestion and pressure. Also notes some sore throat. Coughing up yellowish mucus. Blowing yellowish mucus out of his nose. No fevers, shortness of breath, or ear fullness. Does have a history of allergies and is taking Zyrtec, Singler, and Flonase. This feels different than his prior allergies. He is also tried Alka-Seltzer plus cold and flu.  ROS see history of present illness  Objective  Physical Exam Vitals:   08/23/16 0826  BP: (!) 148/92  Pulse: 87  Temp: 98 F (36.7 C)    BP Readings from Last 3 Encounters:  08/23/16 (!) 148/92  08/02/16 136/78  06/12/16 122/77   Wt Readings from Last 3 Encounters:  08/23/16 226 lb (102.5 kg)  08/02/16 223 lb 6.4 oz (101.3 kg)  06/12/16 217 lb (98.4 kg)    Physical Exam  Constitutional: No distress.  HENT:  Head: Normocephalic and atraumatic.  Mouth/Throat: Oropharynx is clear and moist. No oropharyngeal exudate.  Normal TMs bilaterally  Cardiovascular: Normal rate, regular rhythm and normal heart sounds.   Pulmonary/Chest: Effort normal. No respiratory distress. He has no wheezes. He has no rales.  Mildly coarse breath sounds in the expiratory phase  Neurological: He is alert. Gait normal.  Skin: He is not diaphoretic.     Assessment/Plan: Please see individual problem list.  Cough Patient with continued symptoms. Suspect likely bronchitis and sinus infection potentially. We will treat with doxycycline to cover for both. Hycodan for cough. Return precautions in AVS.   No orders of the defined types were placed in this encounter.   Meds ordered this encounter  Medications  . doxycycline (VIBRA-TABS) 100 MG tablet    Sig: Take 1 tablet (100 mg total) by mouth 2 (two) times daily.    Dispense:  14 tablet   Refill:  0  . HYDROcodone-homatropine (HYCODAN) 5-1.5 MG/5ML syrup    Sig: Take 5 mLs by mouth every 8 (eight) hours as needed for cough.    Dispense:  120 mL    Refill:  0    [name], MD [place]

## 2016-08-29 ENCOUNTER — Other Ambulatory Visit: Payer: Self-pay | Admitting: Family Medicine

## 2016-09-04 ENCOUNTER — Telehealth: Payer: Self-pay | Admitting: Family Medicine

## 2016-09-05 ENCOUNTER — Ambulatory Visit (INDEPENDENT_AMBULATORY_CARE_PROVIDER_SITE_OTHER): Payer: BLUE CROSS/BLUE SHIELD

## 2016-09-05 ENCOUNTER — Encounter: Payer: Self-pay | Admitting: Family

## 2016-09-05 ENCOUNTER — Ambulatory Visit (INDEPENDENT_AMBULATORY_CARE_PROVIDER_SITE_OTHER): Payer: BLUE CROSS/BLUE SHIELD | Admitting: Family

## 2016-09-05 VITALS — BP 154/88 | HR 82 | Temp 97.9°F | Ht 68.75 in | Wt 220.0 lb

## 2016-09-05 DIAGNOSIS — J209 Acute bronchitis, unspecified: Secondary | ICD-10-CM | POA: Diagnosis not present

## 2016-09-05 DIAGNOSIS — I10 Essential (primary) hypertension: Secondary | ICD-10-CM | POA: Diagnosis not present

## 2016-09-05 MED ORDER — PREDNISONE 10 MG PO TABS: ORAL_TABLET | ORAL | 0 refills | Status: DC

## 2016-09-05 MED ORDER — HYDROCODONE-HOMATROPINE 5-1.5 MG/5ML PO SYRP: 5.0000 mL | ORAL_SOLUTION | Freq: Three times a day (TID) | ORAL | 0 refills | Status: DC | PRN

## 2016-09-05 NOTE — Patient Instructions (Addendum)
BP log at home. Goal is < 140/90. Call if persistently higher and let PCP know. F/u BP appointment in couple of weeks.   Please take cough medication at night only as needed. As we discussed, I do not recommend dosing throughout the day as coughing is a protective mechanism . It also helps to break up thick mucous.  Do not take cough suppressants with alcohol as can lead to trouble breathing. Advise caution if taking cough suppressant and operating machinery ( i.e driving a car) as you may feel very tired.    Increase intake of clear fluids. Congestion is best treated by hydration, when mucus is wetter, it is thinner, less sticky, and easier to expel from the body, either through coughing up drainage, or by blowing your nose.   Get plenty of rest.   Use saline nasal drops and blow your nose frequently. Run a humidifier at night and elevate the head of the bed. Vicks Vapor rub will help with congestion and cough. Steam showers and sinus massage for congestion.   Use Acetaminophen or Ibuprofen as needed for fever or pain. Avoid second hand smoke. Even the smallest exposure will worsen symptoms.   Over the counter medications you can try include Delsym for cough, a decongestant for congestion, and Mucinex or Robitussin as an expectorant. Be sure to just get the plain Mucinex or Robitussin that just has one medication (Guaifenesen). We don't recommend the combination products. Note, be sure to drink two glasses of water with each dose of Mucinex as the medication will not work well without adequate hydration.   You can also try a teaspoon of honey to see if this will help reduce cough. Throat lozenges can sometimes be beneficial as well.    This illness will typically last 7 - 10 days.   Please follow up with our clinic if you develop a fever greater than 101 F, symptoms worsen, or do not resolve in the next week.

## 2016-09-05 NOTE — Progress Notes (Signed)
Subjective:    Patient ID: Collin Mills, male    DOB: Jan 29, 1956, 60 y.o.   MRN: [id number]  CC: Collin Mills is a 60 y.o. male who presents today for follow up.   HPI: Patient here for follow-up reevaluation for cough which started almost 2 months ago, . He was seen 11/1 in our office and started on doxycycline, Hycodan cough syrup with improvement. Then 4 days ago productive cough and congestion came back. Taking alselzter plus cold with relief.  Endorses sore throat after 'coughing fit'. No ear pain, wheezing, SOB. Cough worse at night, no coughing in the morning.   H/o seasonal allergies.   History of reflux and taking omeprazole 40 mg QD.  Reflux well controlled on current medication. UTD colonoscopy. No EGD.   Former smoker of 36 years.  No history of asthma, lung disease.  HTN- Elevated since has been sick. Compliant with medications. Denies exertional chest pain or pressure, numbness or tingling radiating to left arm or jaw, palpitations, dizziness, frequent headaches, changes in vision, or shortness of breath.      HISTORY:  Past Medical History:  Diagnosis Date  . Allergic rhinitis   . Chickenpox   . GERD (gastroesophageal reflux disease)   . Hyperlipidemia   . Hypertension   . Migraines   . Osteoarthritis    Past Surgical History:  Procedure Laterality Date  . COLONOSCOPY    . ESOPHAGOGASTRODUODENOSCOPY ENDOSCOPY     Family History  Problem Relation Age of Onset  . Lung cancer    . Stroke    . Hypertension    . Lung cancer Mother   . Lung cancer Father   . Stroke Father   . Colon cancer Neg Hx     Allergies: Patient has no known allergies. Current Outpatient Prescriptions on File Prior to Visit  Medication Sig Dispense Refill  . cetirizine (ZYRTEC) 10 MG tablet Take 1 tablet (10 mg total) by mouth daily. 90 tablet 3  . diltiazem (CARDIZEM CD) 240 MG 24 hr capsule Take 1 capsule (240 mg total) by mouth daily. 90 capsule 0  . doxycycline  (VIBRA-TABS) 100 MG tablet Take 1 tablet (100 mg total) by mouth 2 (two) times daily. 14 tablet 0  . fluticasone (FLONASE) 50 MCG/ACT nasal spray Place into both nostrils daily.    Marland Kitchen losartan (COZAAR) 100 MG tablet TAKE 1 TABLET BY MOUTH ONCE DAILY 90 tablet 1  . montelukast (SINGULAIR) 10 MG tablet TAKE 1 TABLET (10 MG TOTAL) BY MOUTH AT BEDTIME. 90 tablet 0  . omeprazole (PRILOSEC) 40 MG capsule Take 1 capsule (40 mg total) by mouth daily. 90 capsule 3  . pravastatin (PRAVACHOL) 40 MG tablet Take 1 tablet (40 mg total) by mouth daily. 90 tablet 3  . VIAGRA 100 MG tablet TAKE 1 TABLET BY MOUTH 1 HOUR BEFORE INTERCOURSE 6 tablet 5   Current Facility-Administered Medications on File Prior to Visit  Medication Dose Route Frequency Provider Last Rate Last Dose  . 0.9 %  sodium chloride infusion  500 mL Intravenous Continuous Mauri Pole, MD        Social History  Substance Use Topics  . Smoking status: Former Research scientist (life sciences)  . Smokeless tobacco: Never Used  . Alcohol use 3.0 oz/week    5 Shots of liquor per week    Review of Systems  Constitutional: Negative for chills and fever.  HENT: Positive for congestion and sore throat. Negative for ear pain and sinus pain.  Eyes: Negative for visual disturbance.  Respiratory: Positive for cough. Negative for shortness of breath and wheezing.   Cardiovascular: Negative for chest pain and palpitations.  Gastrointestinal: Negative for abdominal distention, abdominal pain, nausea and vomiting.  Neurological: Negative for dizziness and headaches.      Objective:    BP (!) 154/88   Pulse 82   Temp 97.9 F (36.6 C) (Oral)   Ht 5' 8.75" (1.746 m)   Wt 220 lb (99.8 kg)   SpO2 96%   BMI 32.73 kg/m  BP Readings from Last 3 Encounters:  09/05/16 (!) 154/88  08/23/16 (!) 148/92  08/02/16 136/78   Wt Readings from Last 3 Encounters:  09/05/16 220 lb (99.8 kg)  08/23/16 226 lb (102.5 kg)  08/02/16 223 lb 6.4 oz (101.3 kg)    Physical Exam    Constitutional: Vital signs are normal. He appears well-developed and well-nourished.  HENT:  Head: Normocephalic and atraumatic.  Right Ear: Hearing, tympanic membrane, external ear and ear canal normal. No drainage, swelling or tenderness. Tympanic membrane is not injected, not erythematous and not bulging. No middle ear effusion. No decreased hearing is noted.  Left Ear: Hearing, tympanic membrane, external ear and ear canal normal. No drainage, swelling or tenderness. Tympanic membrane is not injected, not erythematous and not bulging.  No middle ear effusion. No decreased hearing is noted.  Nose: Nose normal. Right sinus exhibits no maxillary sinus tenderness and no frontal sinus tenderness. Left sinus exhibits no maxillary sinus tenderness and no frontal sinus tenderness.  Mouth/Throat: Uvula is midline, oropharynx is clear and moist and mucous membranes are normal. No oropharyngeal exudate, posterior oropharyngeal edema, posterior oropharyngeal erythema or tonsillar abscesses.  Eyes: Conjunctivae are normal.  Cardiovascular: Regular rhythm and normal heart sounds.   Pulmonary/Chest: Effort normal and breath sounds normal. No respiratory distress. He has no wheezes. He has no rhonchi. He has no rales.  Lymphadenopathy:       Head (right side): No submental, no submandibular, no tonsillar, no preauricular, no posterior auricular and no occipital adenopathy present.       Head (left side): No submental, no submandibular, no tonsillar, no preauricular, no posterior auricular and no occipital adenopathy present.    He has no cervical adenopathy.  Neurological: He is alert.  Skin: Skin is warm and dry.  Psychiatric: He has a normal mood and affect. His speech is normal and behavior is normal.  Vitals reviewed.      Assessment & Plan:   Problem List Items Addressed This Visit      Cardiovascular and Mediastinum   Essential hypertension    Elevated today. No signs or symptoms of  hypertensive emergency or urgency at this time. Patient will keep blood pressure log at home. He will call us if values are persistently above goal. Follow-up in couple weeks with PCP for blood pressure.        Respiratory   Acute bronchitis - Primary    No adventitious lung sounds. Sa02 96. Patient is afebrile. Working diagnosis of post viral cough.  Trial of prednisone taper. Pending chest x-ray to ensure no bacterial etiology. Alternatively considering COPD exacerbation. No formal diagnosis of this, however patient has 36 year smoking history.      Relevant Medications   predniSONE (DELTASONE) 10 MG tablet   HYDROcodone-homatropine (HYCODAN) 5-1.5 MG/5ML syrup   Other Relevant Orders   DG Chest 2 View       I am having Mr. Mckinny start on predniSONE. I  am also having him maintain his fluticasone, cetirizine, pravastatin, diltiazem, montelukast, losartan, omeprazole, VIAGRA, doxycycline, and HYDROcodone-homatropine. We will continue to administer sodium chloride.   Meds ordered this encounter  Medications  . predniSONE (DELTASONE) 10 MG tablet    Sig: Take 4 tablets ( total 40 mg) by mouth for 2 days; take 3 tablets ( total 30 mg) by mouth for 2 days; take 2 tablets ( total 20 mg) by mouth for 1 day; take 1 tablet ( total 10 mg) by mouth for 1 day.    Dispense:  17 tablet    Refill:  0    Order Specific Question:   Supervising Provider    Answer:   Deborra Medina L [[id number]]  . HYDROcodone-homatropine (HYCODAN) 5-1.5 MG/5ML syrup    Sig: Take 5 mLs by mouth every 8 (eight) hours as needed for cough.    Dispense:  120 mL    Refill:  0    Order Specific Question:   Supervising Provider    Answer:   Crecencio Mc [[id number]]    Return precautions given.   Risks, benefits, and alternatives of the medications and treatment plan prescribed today were discussed, and patient expressed understanding.   Education regarding symptom management and diagnosis given to patient on  AVS.  Continue to follow with [name], MD for routine health maintenance.   Levander Campion and I agreed with plan.   Mable Paris, FNP

## 2016-09-05 NOTE — Assessment & Plan Note (Signed)
Elevated today. No signs or symptoms of hypertensive emergency or urgency at this time. Patient will keep blood pressure log at home. He will call us if values are persistently above goal. Follow-up in couple weeks with PCP for blood pressure.

## 2016-09-05 NOTE — Progress Notes (Signed)
Pre visit review using our clinic review tool, if applicable. No additional management support is needed unless otherwise documented below in the visit note. 

## 2016-09-05 NOTE — Assessment & Plan Note (Addendum)
No adventitious lung sounds. Sa02 96. Patient is afebrile. Working diagnosis of post viral cough.  Trial of prednisone taper. Pending chest x-ray to ensure no bacterial etiology. Alternatively considering COPD exacerbation. No formal diagnosis of this, however patient has 36 year smoking history.

## 2016-09-06 ENCOUNTER — Telehealth: Payer: Self-pay | Admitting: *Deleted

## 2016-09-06 NOTE — Telephone Encounter (Signed)
Patient reported having h/o ?barrett's esophagus based on EGD done at [facility] 12 years ago. His insurance will cover the procedure as diagnostic and patient had higher deductible and he opted not to undergo EGD. Discussed in detail with patient that if he did have Barrett's esophagus, he will need surveillance Will need to obtain records of prior procedures, patient said he has a copy and will mail it.  Damaris Hippo , MD [phone] Mon-Fri 8a-5p [phone] after 5p, weekends, holidays   Patient has not sent EGD report.. Will contact patient to see if he has located endoscopy report and if he wants to schedule EGD

## 2016-09-07 ENCOUNTER — Other Ambulatory Visit: Payer: Self-pay | Admitting: Family Medicine

## 2016-09-08 NOTE — Telephone Encounter (Signed)
Ok to go ahead and schedule for surveillance egd

## 2016-09-08 NOTE — Telephone Encounter (Signed)
Dr Silverio Decamp, Patient can not get a hold of previous EGD report but does have a history of Barretts- Dr Silverio Decamp do you want me to proceed with scheduling this patient fror an Endoscopy?

## 2016-09-12 ENCOUNTER — Encounter: Payer: Self-pay | Admitting: Gastroenterology

## 2016-09-19 ENCOUNTER — Ambulatory Visit (AMBULATORY_SURGERY_CENTER): Payer: Self-pay | Admitting: *Deleted

## 2016-09-19 VITALS — Ht 68.5 in | Wt 223.0 lb

## 2016-09-19 DIAGNOSIS — Z8719 Personal history of other diseases of the digestive system: Secondary | ICD-10-CM

## 2016-09-19 NOTE — Progress Notes (Signed)
No egg or soy allergy. No anesthesia problems.  No home O2.  No diet meds.

## 2016-09-20 ENCOUNTER — Encounter: Payer: Self-pay | Admitting: Family Medicine

## 2016-10-13 ENCOUNTER — Encounter: Payer: BLUE CROSS/BLUE SHIELD | Admitting: Gastroenterology

## 2016-11-06 ENCOUNTER — Other Ambulatory Visit: Payer: Self-pay | Admitting: Family Medicine

## 2017-01-22 ENCOUNTER — Other Ambulatory Visit: Payer: Self-pay | Admitting: Family Medicine

## 2017-01-22 NOTE — Telephone Encounter (Signed)
Last OV 08/02/16 no follow up scheduled, last filled 07/26/16 90 1rf

## 2017-01-22 NOTE — Telephone Encounter (Signed)
Refill sent to pharmacy. Patient needs follow-up set up sometime next 1-2 months.

## 2017-01-23 NOTE — Telephone Encounter (Signed)
Patient states he does not want to schedule an appointment. Informed patient he will need a follow up for further refills.

## 2017-03-04 ENCOUNTER — Other Ambulatory Visit: Payer: Self-pay | Admitting: Family Medicine

## 2017-03-19 ENCOUNTER — Other Ambulatory Visit: Payer: Self-pay | Admitting: Family Medicine

## 2017-04-23 ENCOUNTER — Other Ambulatory Visit: Payer: Self-pay | Admitting: Family Medicine

## 2017-05-22 ENCOUNTER — Emergency Department
Admission: EM | Admit: 2017-05-22 | Discharge: 2017-05-22 | Disposition: A | Discharge status: Home / Self Care | Payer: BLUE CROSS/BLUE SHIELD | Attending: Emergency Medicine | Admitting: Emergency Medicine

## 2017-05-22 ENCOUNTER — Emergency Department: Payer: BLUE CROSS/BLUE SHIELD

## 2017-05-22 ENCOUNTER — Encounter: Payer: Self-pay | Admitting: Emergency Medicine

## 2017-05-22 DIAGNOSIS — R42 Dizziness and giddiness: Secondary | ICD-10-CM | POA: Diagnosis present

## 2017-05-22 DIAGNOSIS — H8149 Vertigo of central origin, unspecified ear: Secondary | ICD-10-CM | POA: Diagnosis not present

## 2017-05-22 DIAGNOSIS — Z87891 Personal history of nicotine dependence: Secondary | ICD-10-CM | POA: Insufficient documentation

## 2017-05-22 DIAGNOSIS — Z7902 Long term (current) use of antithrombotics/antiplatelets: Secondary | ICD-10-CM | POA: Diagnosis not present

## 2017-05-22 DIAGNOSIS — I10 Essential (primary) hypertension: Secondary | ICD-10-CM | POA: Insufficient documentation

## 2017-05-22 DIAGNOSIS — Z79899 Other long term (current) drug therapy: Secondary | ICD-10-CM | POA: Diagnosis not present

## 2017-05-22 LAB — COMPREHENSIVE METABOLIC PANEL
ALK PHOS: 77 U/L (ref 38–126)
ALT: 52 U/L (ref 17–63)
ANION GAP: 7 (ref 5–15)
AST: 31 U/L (ref 15–41)
Albumin: 4.5 g/dL (ref 3.5–5.0)
BILIRUBIN TOTAL: 0.7 mg/dL (ref 0.3–1.2)
BUN: 14 mg/dL (ref 6–20)
CALCIUM: 9.4 mg/dL (ref 8.9–10.3)
CO2: 26 mmol/L (ref 22–32)
Chloride: 107 mmol/L (ref 101–111)
Creatinine, Ser: 0.92 mg/dL (ref 0.61–1.24)
GLUCOSE: 148 mg/dL — AB (ref 65–99)
POTASSIUM: 4 mmol/L (ref 3.5–5.1)
Sodium: 140 mmol/L (ref 135–145)
TOTAL PROTEIN: 7.8 g/dL (ref 6.5–8.1)

## 2017-05-22 LAB — CBC
HEMATOCRIT: 39.2 % — AB (ref 40.0–52.0)
HEMOGLOBIN: 13.1 g/dL (ref 13.0–18.0)
MCH: 28 pg (ref 26.0–34.0)
MCHC: 33.4 g/dL (ref 32.0–36.0)
MCV: 83.7 fL (ref 80.0–100.0)
Platelets: 277 10*3/uL (ref 150–440)
RBC: 4.69 MIL/uL (ref 4.40–5.90)
RDW: 15.1 % — ABNORMAL HIGH (ref 11.5–14.5)
WBC: 8.3 10*3/uL (ref 3.8–10.6)

## 2017-05-22 LAB — DIFFERENTIAL
Basophils Absolute: 0.1 10*3/uL (ref 0–0.1)
Basophils Relative: 1 %
EOS ABS: 0.4 10*3/uL (ref 0–0.7)
EOS PCT: 4 %
LYMPHS ABS: 1.3 10*3/uL (ref 1.0–3.6)
LYMPHS PCT: 16 %
MONO ABS: 0.5 10*3/uL (ref 0.2–1.0)
MONOS PCT: 5 %
NEUTROS PCT: 74 %
Neutro Abs: 6.1 10*3/uL (ref 1.4–6.5)

## 2017-05-22 LAB — TROPONIN I

## 2017-05-22 LAB — PROTIME-INR
INR: 0.92
Prothrombin Time: 12.4 seconds (ref 11.4–15.2)

## 2017-05-22 LAB — APTT: aPTT: 26 seconds (ref 24–36)

## 2017-05-22 MED ORDER — IOPAMIDOL (ISOVUE-370) INJECTION 76%
75.0000 mL | Freq: Once | INTRAVENOUS | Status: AC | PRN
  Administered 2017-05-22: 75 mL via INTRAVENOUS

## 2017-05-22 MED ORDER — MECLIZINE HCL 25 MG PO TABS: 25.0000 mg | ORAL_TABLET | Freq: Three times a day (TID) | ORAL | 0 refills | Status: DC | PRN

## 2017-05-22 MED ORDER — SODIUM CHLORIDE 0.9 % IV BOLUS (SEPSIS)
1000.0000 mL | Freq: Once | INTRAVENOUS | Status: AC
  Administered 2017-05-22: 1000 mL via INTRAVENOUS

## 2017-05-22 MED ORDER — MECLIZINE HCL 25 MG PO TABS
25.0000 mg | ORAL_TABLET | Freq: Once | ORAL | Status: AC
  Administered 2017-05-22: 25 mg via ORAL
  Filled 2017-05-22: qty 1

## 2017-05-22 MED ORDER — ASPIRIN EC 81 MG PO TBEC: 81.0000 mg | DELAYED_RELEASE_TABLET | Freq: Every day | ORAL | 0 refills | Status: AC

## 2017-05-22 NOTE — ED Notes (Signed)
Pt discharged home after verbalizing understanding of discharge instructions; nad noted. 

## 2017-05-22 NOTE — ED Notes (Signed)
Pt transported to MRI 

## 2017-05-22 NOTE — ED Triage Notes (Addendum)
Pt brought over from [facility] with reports of dizziness and HTN with blurrred vision started on Sunday. Symptoms continue today. NAD. Also states feels like he has unsteady gait. Denies headache.

## 2017-05-22 NOTE — ED Provider Notes (Addendum)
[facility] Emergency Department Provider Note  ____________________________________________   First MD Initiated Contact with Patient 05/22/17 [phone]     (approximate)  I have reviewed the triage vital signs and the nursing notes.   HISTORY  Chief Complaint Dizziness and Hypertension   HPI Collin Mills is a 61 y.o. male with a history of hypertension and hypercholesterolemia and family history of stroke was presenting with vertigo that started when waking this morning at 5 AM. He states that he has had intermittent vertigo ever since this past Sunday with episodes lasting about 5 minutes. He says that he feels "drunk." However, his vertigo from this morning has lasted consistently and he has vomited once. He says that it is worse when he moves his head from side to side but never completely goes away even when still. He denies any ringing or roaring sensation in his ears. Denies any recent sinus pressure or sinus infection or URI. Denies any pain or other weakness. Last known normal was 10 PM last night.He describes the blurred vision as difficulty focusing. He does not describe diplopia.   Past Medical History:  Diagnosis Date  . Allergic rhinitis   . Allergy   . Anemia    giving blood every six weeks  . Barrett's esophagus   . Chickenpox   . Colon polyps   . GERD (gastroesophageal reflux disease)   . Hyperlipidemia   . Hypertension   . Migraines   . Osteoarthritis    osteoarthritis    Patient Active Problem List   Diagnosis Date Noted  . Acute bronchitis 09/05/2016  . Cough 08/02/2016  . Obesity 08/02/2016  . Bilateral lower extremity edema 04/03/2016  . Abdominal fullness 04/03/2016  . Osteoarthritis 12/28/2015  . Allergic rhinitis 12/28/2015  . Frequent urination 12/28/2015  . Essential hypertension 12/28/2015  . Hyperlipidemia 12/28/2015    Past Surgical History:  Procedure Laterality Date  . COLONOSCOPY    .  ESOPHAGOGASTRODUODENOSCOPY ENDOSCOPY      Prior to Admission medications   Medication Sig Start Date End Date Taking? Authorizing Provider  celecoxib (CELEBREX) 200 MG capsule Take 200 mg by mouth 2 (two) times daily.    [provider]  cetirizine (ZYRTEC) 10 MG tablet TAKE 1 TABLET BY MOUTH DAILY 03/20/17   Leone Haven, MD  diltiazem (CARDIZEM CD) 240 MG 24 hr capsule TAKE 1 CAPSULE (240 MG TOTAL) BY MOUTH DAILY. 03/05/17   Leone Haven, MD  fluticasone (FLONASE) 50 MCG/ACT nasal spray Place into both nostrils daily.    [provider]  losartan (COZAAR) 100 MG tablet TAKE 1 TABLET BY MOUTH ONCE DAILY 04/23/17   Leone Haven, MD  montelukast (SINGULAIR) 10 MG tablet TAKE 1 TABLET (10 MG TOTAL) BY MOUTH AT BEDTIME. 11/06/16   Leone Haven, MD  Multiple Vitamins-Minerals (MULTIVITAMIN ADULT PO) Take by mouth.    [provider]  omeprazole (PRILOSEC) 40 MG capsule Take 1 capsule (40 mg total) by mouth daily. 08/03/16   Leone Haven, MD  pravastatin (PRAVACHOL) 40 MG tablet Take 1 tablet (40 mg total) by mouth daily. 05/01/16   Leone Haven, MD  VIAGRA 100 MG tablet TAKE 1 TABLET BY MOUTH 1 HOUR BEFORE INTERCOURSE 08/07/16   Guadalupe Maple, MD    Allergies Patient has no known allergies.  Family History  Problem Relation Age of Onset  . Lung cancer Unknown   . Stroke Unknown   . Hypertension Unknown   .  Lung cancer Mother   . Lung cancer Father   . Stroke Father   . Colon cancer Neg Hx     Social History Social History  Substance Use Topics  . Smoking status: Former Research scientist (life sciences)  . Smokeless tobacco: Never Used  . Alcohol use 3.0 oz/week    5 Shots of liquor per week    Review of Systems  Constitutional: No fever/chills Eyes: No visual changes. ENT: No sore throat. Cardiovascular: Denies chest pain. Respiratory: Denies shortness of breath. Gastrointestinal: No abdominal pain.  No diarrhea.  No  constipation. Genitourinary: Negative for dysuria. Musculoskeletal: Negative for back pain. Skin: Negative for rash. Neurological: Negative for headaches, focal weakness or numbness.   ____________________________________________   PHYSICAL EXAM:  VITAL SIGNS: ED Triage Vitals [05/22/17 0845]  Enc Vitals Group     BP      Pulse      Resp      Temp      Temp src      SpO2      Weight 223 lb (101.2 kg)     Height      Head Circumference      Peak Flow      Pain Score      Pain Loc      Pain Edu?      Excl. in [place]?     Constitutional: Alert and oriented. Well appearing and in no acute distress. Eyes: Conjunctivae are normal. Extraocular muscles are intact. Head: Atraumatic.Normal TMs bilaterally Nose: No congestion/rhinnorhea. Mouth/Throat: Mucous membranes are moist.  Neck: No stridor.   Cardiovascular: Normal rate, regular rhythm. Grossly normal heart sounds.   Respiratory: Normal respiratory effort.  No retractions. Lungs CTAB. Gastrointestinal: Soft and nontender. No distention.  Musculoskeletal: No lower extremity tenderness nor edema.  No joint effusions. Neurologic:  Normal speech and language. Right were going nystagmus. No ataxia on finger to nose testing. Skin:  Skin is warm, dry and intact. No rash noted. Psychiatric: Mood and affect are normal. Speech and behavior are normal.  NIH Stroke Scale   Person Administering Scale: Doran Stabler  Administer stroke scale items in the order listed. Record performance in each category after each subscale exam. Do not go back and change scores. Follow directions provided for each exam technique. Scores should reflect what the patient does, not what the clinician thinks the patient can do. The clinician should record answers while administering the exam and work quickly. Except where indicated, the patient should not be coached (i.e., repeated requests to patient to make a special effort).   1a  Level of  consciousness: 0=alert; keenly responsive  1b. LOC questions:  0=Performs both tasks correctly  1c. LOC commands: 0=Performs both tasks correctly  2.  Best Gaze: 0=normal  3.  Visual: 0=No visual loss  4. Facial Palsy: 0=Normal symmetric movement  5a.  Motor left arm: 0=No drift, limb holds 90 (or 45) degrees for full 10 seconds  5b.  Motor right arm: 0=No drift, limb holds 90 (or 45) degrees for full 10 seconds  6a. motor left leg: 0=No drift, limb holds 90 (or 45) degrees for full 10 seconds  6b  Motor right leg:  0=No drift, limb holds 90 (or 45) degrees for full 10 seconds  7. Limb Ataxia: 0=Absent  8.  Sensory: 0=Normal; no sensory loss  9. Best Language:  0=No aphasia, normal  10. Dysarthria: 0=Normal  11. Extinction and Inattention: 0=No abnormality  12. Distal motor function: 0=Normal  Total:   0   ____________________________________________   LABS (all labs ordered are listed, but only abnormal results are displayed)  Labs Reviewed  CBC - Abnormal; Notable for the following:       Result Value   HCT 39.2 (*)    RDW 15.1 (*)    All other components within normal limits  PROTIME-INR  APTT  DIFFERENTIAL  COMPREHENSIVE METABOLIC PANEL  TROPONIN I  CBG MONITORING, ED   ____________________________________________  EKG  ED ECG REPORT I, Doran Stabler, the attending physician, personally viewed and interpreted this ECG.   Date: 05/22/2017  EKG Time: 847  Rate: 81  Rhythm: normal sinus rhythm  Axis: Normal  Intervals:none  ST&T Change: No ST segment elevation or depression. No abnormal T-wave inversion.  ____________________________________________  RADIOLOGY  MRI brain without any acute finding. CT angiography without any large vessel occlusion or significant stenosis. Developmentally hypoplastic left vertebral artery. 2 mm right ICA versus infundibulum versus aneurysm in the posterior indicating  region.  ____________________________________________   PROCEDURES  Procedure(s) performed:   Procedures  Critical Care performed:   ____________________________________________   INITIAL IMPRESSION / ASSESSMENT AND PLAN / ED COURSE  Pertinent labs & imaging results that were available during my care of the patient were reviewed by me and considered in my medical decision making (see chart for details).  ----------------------------------------- 2:25 PM on 05/22/2017 -----------------------------------------  Patient feeling improved after meclizine. Mild vertigo still but much improved from initially. Reassuring imaging findings without any acute process. I discussed the case with Dr. Doy Mince of the neurology service and she feels that the patient should be appropriate for outpatient following for the possible aneurysmal finding. I will send the patient home on 81 mg of aspirin daily. He will follow-up in the clinic for his elevated blood pressure. He says that his normal blood pressures 140/90 but he is not check it regularly and is unsure as if it has been running high lately. Furthermore, on repeat exam he continues to be without any focal neurologic deficits and his nystagmus is now resolved. We discussed the imaging findings and plan for follow-up as an outpatient. He is understanding and willing to comply. He was also able to ambulate to the bathroom.        ____________________________________________   FINAL CLINICAL IMPRESSION(S) / ED DIAGNOSES  Vertigo. Hypertension.    NEW MEDICATIONS STARTED DURING THIS VISIT:  New Prescriptions   No medications on file     Note:  This document was prepared using Dragon voice recognition software and may include unintentional dictation errors.     Orbie Pyo, MD 05/22/17 1427  Furthermore, it is unlikely that the patient's symptoms are being caused by hypertensive urgency. While the pressure is high the  patient is below the threshold as to what I would only would cause symptoms. His baseline is 140/90. Furthermore, his symptoms were also improved with meclizine.    Orbie Pyo, MD 05/22/17 1430

## 2017-05-22 NOTE — ED Notes (Signed)
Pt presents with dizziness since Sunday night. States he had a couple of drinks & didn't think much about it. He felt better and didn't think much about it. He then had another episode around lunch time yesterday. It went away and then came back again this morning, accompanied by 1 episode of vomiting.

## 2017-05-23 ENCOUNTER — Ambulatory Visit (INDEPENDENT_AMBULATORY_CARE_PROVIDER_SITE_OTHER): Payer: BLUE CROSS/BLUE SHIELD | Admitting: Family

## 2017-05-23 ENCOUNTER — Encounter: Payer: Self-pay | Admitting: Family

## 2017-05-23 ENCOUNTER — Telehealth: Payer: Self-pay | Admitting: Family Medicine

## 2017-05-23 VITALS — BP 170/95 | HR 85 | Temp 98.3°F | Ht 68.5 in | Wt 232.0 lb

## 2017-05-23 DIAGNOSIS — R42 Dizziness and giddiness: Secondary | ICD-10-CM

## 2017-05-23 DIAGNOSIS — I10 Essential (primary) hypertension: Secondary | ICD-10-CM | POA: Diagnosis not present

## 2017-05-23 DIAGNOSIS — H539 Unspecified visual disturbance: Secondary | ICD-10-CM

## 2017-05-23 MED ORDER — AMLODIPINE BESYLATE 5 MG PO TABS: 5.0000 mg | ORAL_TABLET | Freq: Every day | ORAL | 3 refills | Status: DC

## 2017-05-23 NOTE — Progress Notes (Signed)
Pre visit review using our clinic review tool, if applicable. No additional management support is needed unless otherwise documented below in the visit note. 

## 2017-05-23 NOTE — Assessment & Plan Note (Addendum)
Improved today on meclizine. Working diagnosis of BPPV. Considering orthostatic hypotension, mildly orthostatic ( see flow sheet). Encouraged increased water. Patient does not describe classic vertigo - room spinning- however when attempting dix hall pike or even turned head from side to side, patient stated he felt dizzy. Discussed may be exacerbated by underlying allergies. Referral to ENT for further evaluation of vertigo. Education provided.

## 2017-05-23 NOTE — Progress Notes (Signed)
Subjective:    Patient ID: Collin Mills, male    DOB: 1956/02/12, 61 y.o.   MRN: [id number]  CC: Collin Mills is a 61 y.o. male who presents today for an acute visit.    HPI: Complaining of dizziness, improved today. Moves head side to side, 'very dizzy'. NO  Other triggers, can feel dizzy just sitting.   Room is not spinning. Harder to focus vision. No syncope.  No nasal congestion currently though reports chronic allergic rhinitis.   Yesterday felt 'totally drunk' and better today. Had meclizine this morning with some help.   HTN- On diltizem and cozaar. At home 170/99. Blood pressure this morning was 173/11.   Denies exertional chest pain or pressure, numbness or tingling radiating to left arm or jaw, palpitations, frequent headaches, changes in vision, or shortness of breath.    Last eye exam 2 months ago.   Has appointment with Dr Melrose Nakayama for aneursysm    Seen in emergency room yesterday for severe vertigo, vomited once, nausea and elevated blood pressure ranging 167/107. Described  blurry vision, no double vision. EKG shows normal sinus rhythm. No ST elevation or depression. MR brain without any acute finding. CT angiogram without any large vessel occlusion or significant stenosis. 64m right ICA infundibulum versus aneurysm. Given meclizine. Advise follow with neurology for possible aneurysm finding. Started on 81 mg of aspirin  Troponin negative Normal kidney function, electrolytes   HISTORY:  Past Medical History:  Diagnosis Date  . Allergic rhinitis   . Allergy   . Anemia    giving blood every six weeks  . Barrett's esophagus   . Chickenpox   . Colon polyps   . GERD (gastroesophageal reflux disease)   . Hyperlipidemia   . Hypertension   . Migraines   . Osteoarthritis    osteoarthritis   Past Surgical History:  Procedure Laterality Date  . COLONOSCOPY    . ESOPHAGOGASTRODUODENOSCOPY ENDOSCOPY     Family History  Problem Relation Age of  Onset  . Lung cancer Unknown   . Stroke Unknown   . Hypertension Unknown   . Lung cancer Mother   . Lung cancer Father   . Stroke Father   . Colon cancer Neg Hx     Allergies: Patient has no known allergies. Current Outpatient Prescriptions on File Prior to Visit  Medication Sig Dispense Refill  . aspirin EC 81 MG tablet Take 1 tablet (81 mg total) by mouth daily. 30 tablet 0  . cetirizine (ZYRTEC) 10 MG tablet TAKE 1 TABLET BY MOUTH DAILY 90 tablet 3  . fluticasone (FLONASE) 50 MCG/ACT nasal spray Place into both nostrils daily.    .Marland Kitchenlosartan (COZAAR) 100 MG tablet TAKE 1 TABLET BY MOUTH ONCE DAILY 90 tablet 0  . meclizine (ANTIVERT) 25 MG tablet Take 1 tablet (25 mg total) by mouth 3 (three) times daily as needed for dizziness. 30 tablet 0  . omeprazole (PRILOSEC) 40 MG capsule Take 1 capsule (40 mg total) by mouth daily. 90 capsule 3  . pravastatin (PRAVACHOL) 40 MG tablet Take 1 tablet (40 mg total) by mouth daily. 90 tablet 3  . VIAGRA 100 MG tablet TAKE 1 TABLET BY MOUTH 1 HOUR BEFORE INTERCOURSE 6 tablet 5   Current Facility-Administered Medications on File Prior to Visit  Medication Dose Route Frequency Provider Last Rate Last Dose  . 0.9 %  sodium chloride infusion  500 mL Intravenous Continuous Nandigam, KVenia Minks MD  Social History  Substance Use Topics  . Smoking status: Former Research scientist (life sciences)  . Smokeless tobacco: Never Used  . Alcohol use 3.0 oz/week    5 Shots of liquor per week    Review of Systems  Constitutional: Negative for chills and fever.  Eyes: Positive for visual disturbance.  Respiratory: Negative for cough.   Cardiovascular: Negative for chest pain and palpitations.  Gastrointestinal: Negative for nausea and vomiting.  Neurological: Negative for dizziness and headaches.      Objective:    BP (!) 170/95   Pulse 85   Temp 98.3 F (36.8 C) (Oral)   Ht 5' 8.5" (1.74 m)   Wt 232 lb (105.2 kg)   SpO2 95%   BMI 34.76 kg/m  BP Readings from Last  3 Encounters:  05/23/17 (!) 170/95  05/22/17 (!) 174/96  09/05/16 (!) 154/88     Physical Exam  Constitutional: He appears well-developed and well-nourished.  HENT:  Right Ear: Hearing normal.  Left Ear: Hearing normal.  Mouth/Throat: Uvula is midline, oropharynx is clear and moist and mucous membranes are normal. No posterior oropharyngeal edema or posterior oropharyngeal erythema.  Eyes: Pupils are equal, round, and reactive to light. Conjunctivae, EOM and lids are normal. Lids are everted and swept, no foreign bodies found.  Normal fundus bilaterally.  Cardiovascular: Regular rhythm and normal heart sounds.   Pulmonary/Chest: Effort normal and breath sounds normal. No respiratory distress. He has no wheezes. He has no rhonchi. He has no rales.  Lymphadenopathy:       Head (right side): No submental, no submandibular, no tonsillar, no preauricular, no posterior auricular and no occipital adenopathy present.       Head (left side): No submental, no submandibular, no tonsillar, no preauricular, no posterior auricular and no occipital adenopathy present.    He has no cervical adenopathy.  Neurological: He is alert. He has normal strength. No cranial nerve deficit or sensory deficit. He displays a negative Romberg sign.  Reflex Scores:      Bicep reflexes are 2+ on the right side and 2+ on the left side.      Patellar reflexes are 2+ on the right side and 2+ on the left side. Grip equal and strong bilateral upper extremities. Gait strong and steady. Able to perform rapid alternating movement without difficulty. Attempted Dix-Hallpike maneuver in supine position however when I laid patient back he became very "Dizzy" per patient. No nystagmus noticed, nausea or vomiting. Attempted modified Dix-Hallpike with turning head quickly from left to right while upright, worsened "dizziness" per patient. No nystagmus noted.  Skin: Skin is warm and dry.  Psychiatric: He has a normal mood and affect.  His speech is normal and behavior is normal.  Vitals reviewed.      Assessment & Plan:   Problem List Items Addressed This Visit      Cardiovascular and Mediastinum   Essential hypertension    Elevated. Reassured by absence of CP, SOB, palpitations. No h/o arrhythmia, angina. EKG today shows no significant changes from yesterday. No acute ischemia and reviewed EKG with PCP. Discussed and agreed with PCP to start amlodipine ( and titrate) and stop cardizem. Will stay on losartan. Close vigilance and follow up with PCP in 1-2 weeks.        Relevant Medications   amLODipine (NORVASC) 5 MG tablet     Other   Dizziness    Improved today on meclizine. Working diagnosis of BPPV. Considering orthostatic hypotension, mildly orthostatic ( see flow  sheet). Encouraged increased water. Patient does not describe classic vertigo - room spinning- however when attempting dix hall pike or even turned head from side to side, patient stated he felt dizzy. Discussed may be exacerbated by underlying allergies. Referral to ENT for further evaluation of vertigo. Education provided.       Relevant Orders   Ambulatory referral to ENT   Change in vision    Reassured that MRI head showed no acute infarct. Concern that elevated BP is contributing to vision 'not as crisp. Will treat BP and if no improvement, have advised patient to return to opthalmology for further evaluation.        Other Visit Diagnoses    Hypertension, unspecified type    -  Primary   Relevant Medications   amLODipine (NORVASC) 5 MG tablet   Other Relevant Orders   EKG 12-Lead (Completed)        I have discontinued Mr. Brooker's montelukast and diltiazem. I am also having him start on amLODipine. Additionally, I am having him maintain his fluticasone, pravastatin, omeprazole, VIAGRA, cetirizine, losartan, meclizine, and aspirin EC. We will continue to administer sodium chloride.   Meds ordered this encounter  Medications  .  amLODipine (NORVASC) 5 MG tablet    Sig: Take 1 tablet (5 mg total) by mouth daily.    Dispense:  90 tablet    Refill:  3    Order Specific Question:   Supervising Provider    Answer:   Crecencio Mc [[id number]]    Return precautions given.   Risks, benefits, and alternatives of the medications and treatment plan prescribed today were discussed, and patient expressed understanding.   Education regarding symptom management and diagnosis given to patient on AVS.  Continue to follow with Leone Haven, MD for routine health maintenance.   Levander Campion and I agreed with plan.   Mable Paris, FNP

## 2017-05-23 NOTE — Assessment & Plan Note (Addendum)
Elevated. Reassured by absence of CP, SOB, palpitations. No h/o arrhythmia, angina. EKG today shows no significant changes from yesterday. No acute ischemia and reviewed EKG with PCP. Discussed and agreed with PCP to start amlodipine ( and titrate) and stop cardizem. Will stay on losartan. Close vigilance and follow up with PCP in 1-2 weeks.

## 2017-05-23 NOTE — Telephone Encounter (Signed)
Pt spouse called and asked if M. Arnett could put that referral in for Neurology. Pt would like to use [facility] or somewhere in [place]. Please advise, thank you!

## 2017-05-23 NOTE — Assessment & Plan Note (Signed)
Reassured that MRI head showed no acute infarct. Concern that elevated BP is contributing to vision 'not as crisp. Will treat BP and if no improvement, have advised patient to return to opthalmology for further evaluation.

## 2017-05-23 NOTE — Patient Instructions (Signed)
As discussed, suspect elevated blood pressure is causing your vision not to be as crisp.   Let's treat  blood pressure and bring this down slowly. In doing this,  continue to watch for any new symptoms and for improvement in your vision.   start amlodipine 5 mg today.  stop Cardizem.  Watch  blood pressure for next couple days, if you are not reaching goal of <140/80, he may take 2 tablets of amlodipine for total of 10 mg per day.  In follow-up with her PCP in a week or 2 weeks. Sooner if any symptoms  Referral to ear nose and throat for additional evaluation of vertigo  Ensure you keep your appointment with neurology to evaluate for possible aneurysm   Dizziness Dizziness is a common problem. It is a feeling of unsteadiness or light-headedness. You may feel like you are about to faint. Dizziness can lead to injury if you stumble or fall. Anyone can become dizzy, but dizziness is more common in older adults. This condition can be caused by a number of things, including medicines, dehydration, or illness. Follow these instructions at home: Taking these steps may help with your condition: Eating and drinking  Drink enough fluid to keep your urine clear or pale yellow. This helps to keep you from becoming dehydrated. Try to drink more clear fluids, such as water.  Do not drink alcohol.  Limit your caffeine intake if directed by your health care provider.  Limit your salt intake if directed by your health care provider. Activity  Avoid making quick movements. ? Rise slowly from chairs and steady yourself until you feel okay. ? In the morning, first sit up on the side of the bed. When you feel okay, stand slowly while you hold onto something until you know that your balance is fine.  Move your legs often if you need to stand in one place for a long time. Tighten and relax your muscles in your legs while you are standing.  Do not drive or operate heavy machinery if you feel  dizzy.  Avoid bending down if you feel dizzy. Place items in your home so that they are easy for you to reach without leaning over. Lifestyle  Do not use any tobacco products, including cigarettes, chewing tobacco, or electronic cigarettes. If you need help quitting, ask your health care provider.  Try to reduce your stress level, such as with yoga or meditation. Talk with your health care provider if you need help. General instructions  Watch your dizziness for any changes.  Take medicines only as directed by your health care provider. Talk with your health care provider if you think that your dizziness is caused by a medicine that you are taking.  Tell a friend or a family member that you are feeling dizzy. If he or she notices any changes in your behavior, have this person call your health care provider.  Keep all follow-up visits as directed by your health care provider. This is important. Contact a health care provider if:  Your dizziness does not go away.  Your dizziness or light-headedness gets worse.  You feel nauseous.  You have reduced hearing.  You have new symptoms.  You are unsteady on your feet or you feel like the room is spinning. Get help right away if:  You vomit or have diarrhea and are unable to eat or drink anything.  You have problems talking, walking, swallowing, or using your arms, hands, or legs.  You feel generally  weak.  You are not thinking clearly or you have trouble forming sentences. It may take a friend or family member to notice this.  You have chest pain, abdominal pain, shortness of breath, or sweating.  Your vision changes.  You notice any bleeding.  You have a headache.  You have neck pain or a stiff neck.  You have a fever. This information is not intended to replace advice given to you by your health care provider. Make sure you discuss any questions you have with your health care provider. Document Released: 04/04/2001  Document Revised: 03/16/2016 Document Reviewed: 10/05/2014 Elsevier Interactive Patient Education  2017 [employer].   Stroke Prevention Some medical conditions and behaviors are associated with an increased chance of having a stroke. You may prevent a stroke by making healthy choices and managing medical conditions. How can I reduce my risk of having a stroke?  Stay physically active. Get at least 30 minutes of activity on most or all days.  Do not smoke. It may also be helpful to avoid exposure to secondhand smoke.  Limit alcohol use. Moderate alcohol use is considered to be: ? No more than 2 drinks per day for men. ? No more than 1 drink per day for nonpregnant women.  Eat healthy foods. This involves: ? Eating 5 or more servings of fruits and vegetables a day. ? Making dietary changes that address high blood pressure (hypertension), high cholesterol, diabetes, or obesity.  Manage your cholesterol levels. ? Making food choices that are high in fiber and low in saturated fat, trans fat, and cholesterol may control cholesterol levels. ? Take any prescribed medicines to control cholesterol as directed by your health care provider.  Manage your diabetes. ? Controlling your carbohydrate and sugar intake is recommended to manage diabetes. ? Take any prescribed medicines to control diabetes as directed by your health care provider.  Control your hypertension. ? Making food choices that are low in salt (sodium), saturated fat, trans fat, and cholesterol is recommended to manage hypertension. ? Ask your health care provider if you need treatment to lower your blood pressure. Take any prescribed medicines to control hypertension as directed by your health care provider. ? If you are 59-95 years of age, have your blood pressure checked every 3-5 years. If you are 54 years of age or older, have your blood pressure checked every year.  Maintain a healthy weight. ? Reducing calorie intake  and making food choices that are low in sodium, saturated fat, trans fat, and cholesterol are recommended to manage weight.  Stop drug abuse.  Avoid taking birth control pills. ? Talk to your health care provider about the risks of taking birth control pills if you are over 90 years old, smoke, get migraines, or have ever had a blood clot.  Get evaluated for sleep disorders (sleep apnea). ? Talk to your health care provider about getting a sleep evaluation if you snore a lot or have excessive sleepiness.  Take medicines only as directed by your health care provider. ? For some people, aspirin or blood thinners (anticoagulants) are helpful in reducing the risk of forming abnormal blood clots that can lead to stroke. If you have the irregular heart rhythm of atrial fibrillation, you should be on a blood thinner unless there is a good reason you cannot take them. ? Understand all your medicine instructions.  Make sure that other conditions (such as anemia or atherosclerosis) are addressed. Get help right away if:  You have sudden  weakness or numbness of the face, arm, or leg, especially on one side of the body.  Your face or eyelid droops to one side.  You have sudden confusion.  You have trouble speaking (aphasia) or understanding.  You have sudden trouble seeing in one or both eyes.  You have sudden trouble walking.  You have dizziness.  You have a loss of balance or coordination.  You have a sudden, severe headache with no known cause.  You have new chest pain or an irregular heartbeat. Any of these symptoms may represent a serious problem that is an emergency. Do not wait to see if the symptoms will go away. Get medical help at once. Call your local emergency services (911 in [place] Do not drive yourself to the hospital. This information is not intended to replace advice given to you by your health care provider. Make sure you discuss any questions you have with your health care  provider. Document Released: 11/16/2004 Document Revised: 03/16/2016 Document Reviewed: 04/11/2013 Elsevier Interactive Patient Education  2017 [employer].

## 2017-05-24 ENCOUNTER — Other Ambulatory Visit: Payer: Self-pay | Admitting: Family

## 2017-05-24 ENCOUNTER — Encounter: Payer: Self-pay | Admitting: Family

## 2017-05-24 DIAGNOSIS — R9089 Other abnormal findings on diagnostic imaging of central nervous system: Secondary | ICD-10-CM

## 2017-05-24 NOTE — Telephone Encounter (Signed)
Please advise 

## 2017-05-24 NOTE — Telephone Encounter (Signed)
This has been done - see mychart message   Let pt know

## 2017-05-28 DIAGNOSIS — H539 Unspecified visual disturbance: Secondary | ICD-10-CM | POA: Insufficient documentation

## 2017-06-02 ENCOUNTER — Other Ambulatory Visit: Payer: Self-pay | Admitting: Family Medicine

## 2017-06-05 ENCOUNTER — Encounter: Payer: Self-pay | Admitting: Family

## 2017-06-13 ENCOUNTER — Encounter: Payer: Self-pay | Admitting: Family Medicine

## 2017-06-13 ENCOUNTER — Ambulatory Visit (INDEPENDENT_AMBULATORY_CARE_PROVIDER_SITE_OTHER): Payer: BLUE CROSS/BLUE SHIELD | Admitting: Family Medicine

## 2017-06-13 DIAGNOSIS — I10 Essential (primary) hypertension: Secondary | ICD-10-CM | POA: Diagnosis not present

## 2017-06-13 DIAGNOSIS — I671 Cerebral aneurysm, nonruptured: Secondary | ICD-10-CM | POA: Diagnosis not present

## 2017-06-13 DIAGNOSIS — R42 Dizziness and giddiness: Secondary | ICD-10-CM | POA: Diagnosis not present

## 2017-06-13 MED ORDER — HYDROCHLOROTHIAZIDE 12.5 MG PO TABS: 12.5000 mg | ORAL_TABLET | Freq: Every day | ORAL | 1 refills | Status: DC

## 2017-06-13 NOTE — Assessment & Plan Note (Signed)
Resolved. Suspect related to blood pressure. Monitor for recurrence.

## 2017-06-13 NOTE — Patient Instructions (Signed)
Nice to see you. We will decrease your amlodipine to 5 mg daily. We'll start you on hydrochlorothiazide once daily. We will have you return in one month for recheck. Please start checking your blood pressure at various times during the day.

## 2017-06-13 NOTE — Assessment & Plan Note (Signed)
Still uncontrolled at home. Has some mild swelling in his bilateral lower extremities that I suspect is related to his amlodipine as it did not start until they increase the dose. We'll back him off to 5 mg of amlodipine daily. We'll start him on HCTZ 12.5 mg daily. We'll see him back in a month for recheck.

## 2017-06-13 NOTE — Assessment & Plan Note (Signed)
Small 2 mm aneurysm noted on prior imaging. He'll monitor and continue to follow with neurology.

## 2017-06-13 NOTE — Progress Notes (Signed)
  [name], MD Phone: [phone]  Collin Mills is a 61 y.o. male who presents today for follow-up.  Patient seen several weeks ago in the emergency room for vertigo and dizziness that was felt to be related to elevated blood pressure. He followed up with our nurse practitioner who changed him to amlodipine from his prior calcium channel blocker. He has been on 10 mg of that. He has been on losartan. He notes some minimal edema in his lower extremities that goes away with propping his legs up. No chest pain or shortness of breath. No orthopnea. He notes the dizziness has resolved and is not an issue currently. He saw neurology for this particularly given that he had imaging that revealed a small aneurysm. They plan on following up on that in 2 years with imaging again. The neurologist is going to see him back in a year. Patient notes his blood pressure has typically been running in the 140s-150s/80s-90s. He has only been checking it in the morning.  PMH: Former smoker   ROS see history of present illness  Objective  Physical Exam Vitals:   06/13/17 1505  BP: 134/86  Pulse: 81  Resp: 16  Temp: 98 F (36.7 C)  SpO2: 95%    BP Readings from Last 3 Encounters:  06/13/17 134/86  05/23/17 (!) 170/95  05/22/17 (!) 174/96   Wt Readings from Last 3 Encounters:  06/13/17 231 lb 2 oz (104.8 kg)  05/23/17 232 lb (105.2 kg)  05/22/17 223 lb (101.2 kg)    Physical Exam  Cardiovascular: Normal rate, regular rhythm and normal heart sounds.   Pulmonary/Chest: Effort normal and breath sounds normal.  Musculoskeletal: He exhibits edema (slight nonpitting bilateral lower extremities to mid shin).  Neurological: He is alert. Gait normal.  Skin: Skin is warm and dry. He is not diaphoretic.     Assessment/Plan: Please see individual problem list.  Essential hypertension Still uncontrolled at home. Has some mild swelling in his bilateral lower extremities that I suspect is  related to his amlodipine as it did not start until they increase the dose. We'll back him off to 5 mg of amlodipine daily. We'll start him on HCTZ 12.5 mg daily. We'll see him back in a month for recheck.  Dizziness Resolved. Suspect related to blood pressure. Monitor for recurrence.  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 He'll monitor and continue to follow with neurology.   No orders of the defined types were placed in this encounter.   Meds ordered this encounter  Medications  . hydrochlorothiazide (HYDRODIURIL) 12.5 MG tablet    Sig: Take 1 tablet (12.5 mg total) by mouth daily.    Dispense:  90 tablet    Refill:  1   [name], MD [place]

## 2017-07-12 ENCOUNTER — Encounter: Payer: Self-pay | Admitting: Family Medicine

## 2017-07-12 ENCOUNTER — Ambulatory Visit (INDEPENDENT_AMBULATORY_CARE_PROVIDER_SITE_OTHER): Payer: BLUE CROSS/BLUE SHIELD | Admitting: Family Medicine

## 2017-07-12 VITALS — BP 128/80 | HR 90 | Temp 97.9°F | Wt 227.6 lb

## 2017-07-12 DIAGNOSIS — E785 Hyperlipidemia, unspecified: Secondary | ICD-10-CM | POA: Diagnosis not present

## 2017-07-12 DIAGNOSIS — I10 Essential (primary) hypertension: Secondary | ICD-10-CM | POA: Diagnosis not present

## 2017-07-12 DIAGNOSIS — E6609 Other obesity due to excess calories: Secondary | ICD-10-CM | POA: Diagnosis not present

## 2017-07-12 NOTE — Patient Instructions (Signed)
Nice to see you. We'll get lab work today and contact you with results and then make a decision on your blood pressure medication. Please continue with diet and exercise.

## 2017-07-12 NOTE — Assessment & Plan Note (Signed)
Tolerating medication. We'll have him check a fasting lipid panel in 1 month.

## 2017-07-12 NOTE — Assessment & Plan Note (Signed)
Patient has started to work extensively on diet and exercise. He is down 4 pounds since her last visit. Encouraged continued diet and exercise.

## 2017-07-12 NOTE — Assessment & Plan Note (Signed)
Still uncontrolled about 60% of the time. We'll check lab work today and then likely increase his HCTZ. Follow-up in one month for nurse visit for BP check and labs.

## 2017-07-12 NOTE — Progress Notes (Signed)
  [name], MD Phone: [phone]  Collin Mills is a 61 y.o. male who presents today for follow-up.  Hypertension: BP running between 130-140s/80s-90s. About 60% of the time is uncontrolled. Taking amlodipine 5 mg, HCTZ 12.5 mg, losartan. No chest pain or shortness of breath. Notes his edema is improved with the decreased dose of amlodipine.  Hyperlipidemia: Taking pravastatin." Pain or myalgias.  Patient has been working on diet and exercise extensively. He is going to the gym and lifting weights and doing the treadmill 3 times a week. He is also doing calorie restriction with a 1700-calorie diet. He is eating lots of boiled eggs, chicken, fruits, and vegetables.  PMH: Former smoker   ROS see history of present illness  Objective  Physical Exam Vitals:   07/12/17 0925  BP: 128/80  Pulse: 90  Temp: 97.9 F (36.6 C)  SpO2: 95%    BP Readings from Last 3 Encounters:  07/12/17 128/80  06/13/17 134/86  05/23/17 (!) 170/95   Wt Readings from Last 3 Encounters:  07/12/17 227 lb 9.6 oz (103.2 kg)  06/13/17 231 lb 2 oz (104.8 kg)  05/23/17 232 lb (105.2 kg)    Physical Exam  Constitutional: No distress.  Cardiovascular: Normal rate, regular rhythm and normal heart sounds.   Pulmonary/Chest: Effort normal and breath sounds normal.  Musculoskeletal: He exhibits no edema.  Neurological: He is alert. Gait normal.  Skin: Skin is warm and dry. He is not diaphoretic.     Assessment/Plan: Please see individual problem list.  Hyperlipidemia Tolerating medication. We'll have him check a fasting lipid panel in 1 month.  Essential hypertension Still uncontrolled about 60% of the time. We'll check lab work today and then likely increase his HCTZ. Follow-up in one month for nurse visit for BP check and labs.  Obesity Patient has started to work extensively on diet and exercise. He is down 4 pounds since her last visit. Encouraged continued diet and  exercise.   Orders Placed This Encounter  Procedures  . Basic Metabolic Panel (BMET)  . Lipid panel    Standing Status:   Future    Standing Expiration Date:   07/12/2018  . Comp Met (CMET)    Standing Status:   Future    Standing Expiration Date:   07/12/2018   [name], MD [place]

## 2017-07-13 ENCOUNTER — Other Ambulatory Visit: Payer: Self-pay | Admitting: Family Medicine

## 2017-07-13 ENCOUNTER — Telehealth: Payer: Self-pay | Admitting: Family Medicine

## 2017-07-13 DIAGNOSIS — R7309 Other abnormal glucose: Secondary | ICD-10-CM

## 2017-07-13 LAB — BASIC METABOLIC PANEL
BUN / CREAT RATIO: 11 (ref 10–24)
BUN: 11 mg/dL (ref 8–27)
CHLORIDE: 102 mmol/L (ref 96–106)
CO2: 22 mmol/L (ref 20–29)
Calcium: 10.2 mg/dL (ref 8.6–10.2)
Creatinine, Ser: 1.04 mg/dL (ref 0.76–1.27)
GFR calc Af Amer: 90 mL/min/{1.73_m2} (ref >59)
GFR calc non Af Amer: 78 mL/min/{1.73_m2} (ref >59)
GLUCOSE: 112 mg/dL — AB (ref 65–99)
POTASSIUM: 4.3 mmol/L (ref 3.5–5.2)
SODIUM: 141 mmol/L (ref 134–144)

## 2017-07-13 MED ORDER — HYDROCHLOROTHIAZIDE 25 MG PO TABS: 25.0000 mg | ORAL_TABLET | Freq: Every day | ORAL | 3 refills | Status: DC

## 2017-07-13 NOTE — Telephone Encounter (Signed)
See result note.  

## 2017-07-13 NOTE — Telephone Encounter (Signed)
Pt called back returning your call. Please advise, thank you!  Call pt @ [phone]

## 2017-07-18 ENCOUNTER — Telehealth: Payer: Self-pay | Admitting: Family Medicine

## 2017-07-18 NOTE — Telephone Encounter (Signed)
Please advise 

## 2017-07-18 NOTE — Telephone Encounter (Signed)
Pt asked to have his lab orders sent over to [facility]. Please advise, thank you!

## 2017-07-19 NOTE — Telephone Encounter (Signed)
On your desk

## 2017-07-19 NOTE — Telephone Encounter (Signed)
Please fill out [facility] order form with patient information and my information. Please place this on my desk and I will sign it tomorrow. Thanks.

## 2017-07-23 NOTE — Telephone Encounter (Signed)
error 

## 2017-07-26 ENCOUNTER — Other Ambulatory Visit: Payer: Self-pay | Admitting: Family Medicine

## 2017-07-31 ENCOUNTER — Telehealth: Payer: Self-pay | Admitting: Family Medicine

## 2017-07-31 DIAGNOSIS — I10 Essential (primary) hypertension: Secondary | ICD-10-CM

## 2017-07-31 MED ORDER — AMLODIPINE BESYLATE 5 MG PO TABS: 5.0000 mg | ORAL_TABLET | Freq: Every day | ORAL | 0 refills | Status: DC

## 2017-07-31 NOTE — Telephone Encounter (Signed)
Pt states that he needs a 14day supply on amLODipine (NORVASC) 5 MG tablet sent to [facility] [address] To take him till he can refill his 90day supply because he was told to double up fpr 2wks

## 2017-07-31 NOTE — Telephone Encounter (Signed)
Medication has been placed.

## 2017-08-13 ENCOUNTER — Other Ambulatory Visit: Payer: Self-pay | Admitting: Family Medicine

## 2017-08-14 ENCOUNTER — Ambulatory Visit (INDEPENDENT_AMBULATORY_CARE_PROVIDER_SITE_OTHER): Payer: BLUE CROSS/BLUE SHIELD | Admitting: *Deleted

## 2017-08-14 VITALS — BP 122/80 | HR 85 | Resp 18

## 2017-08-14 DIAGNOSIS — I10 Essential (primary) hypertension: Secondary | ICD-10-CM

## 2017-08-14 LAB — COMPREHENSIVE METABOLIC PANEL
ALBUMIN: 4.3 g/dL (ref 3.6–4.8)
ALT: 29 IU/L (ref 0–44)
AST: 18 IU/L (ref 0–40)
Albumin/Globulin Ratio: 1.9 (ref 1.2–2.2)
Alkaline Phosphatase: 65 IU/L (ref 39–117)
BILIRUBIN TOTAL: 0.3 mg/dL (ref 0.0–1.2)
BUN / CREAT RATIO: 12 (ref 10–24)
BUN: 11 mg/dL (ref 8–27)
CO2: 24 mmol/L (ref 20–29)
CREATININE: 0.94 mg/dL (ref 0.76–1.27)
Calcium: 9.6 mg/dL (ref 8.6–10.2)
Chloride: 103 mmol/L (ref 96–106)
GFR calc Af Amer: 101 mL/min/{1.73_m2} (ref >59)
GFR calc non Af Amer: 88 mL/min/{1.73_m2} (ref >59)
GLOBULIN, TOTAL: 2.3 g/dL (ref 1.5–4.5)
GLUCOSE: 127 mg/dL — AB (ref 65–99)
Potassium: 3.5 mmol/L (ref 3.5–5.2)
Sodium: 142 mmol/L (ref 134–144)
TOTAL PROTEIN: 6.6 g/dL (ref 6.0–8.5)

## 2017-08-14 LAB — LIPID PANEL W/O CHOL/HDL RATIO
CHOLESTEROL TOTAL: 205 mg/dL — AB (ref 100–199)
HDL: 40 mg/dL (ref >39)
LDL CALC: 103 mg/dL — AB (ref 0–99)
TRIGLYCERIDES: 309 mg/dL — AB (ref 0–149)
VLDL CHOLESTEROL CAL: 62 mg/dL — AB (ref 5–40)

## 2017-08-14 LAB — HGB A1C W/O EAG: Hgb A1c MFr Bld: 6.4 % — ABNORMAL HIGH (ref 4.8–5.6)

## 2017-08-14 NOTE — Progress Notes (Signed)
Patient presented for one month BP check patient BP in left arm 118/76 pulse 85, right arm 122/80 pulse 85.

## 2017-08-15 NOTE — Progress Notes (Signed)
Patient notified and voiced understanding.

## 2017-08-15 NOTE — Progress Notes (Signed)
BP at goal. Continue current regimen.

## 2017-08-17 IMAGING — CT CT ANGIO NECK
1 of 11 series · 5 of 33 positions shown · IV contrast (APPLIED)
Comparison: None.

CLINICAL DATA: Recurrent vertigo in the past 2 days. Rightward
going nystagmus.

EXAM:
CT ANGIOGRAPHY HEAD AND NECK
TECHNIQUE: Multidetector CT imaging of the head and neck was performed using
the standard protocol during bolus administration of intravenous
contrast. Multiplanar CT image reconstructions and MIPs were
obtained to evaluate the vascular anatomy. Carotid stenosis
measurements (when applicable) are obtained utilizing NASCET
criteria, using the distal internal carotid diameter as the
denominator.
CONTRAST:  75 mL Isovue 370

[Series 7: ax thin · axial · 0.53mm/px · z∈[-378,-89]mm · 5 of 453 slices shown]
[im 76/453  soft-tissue]
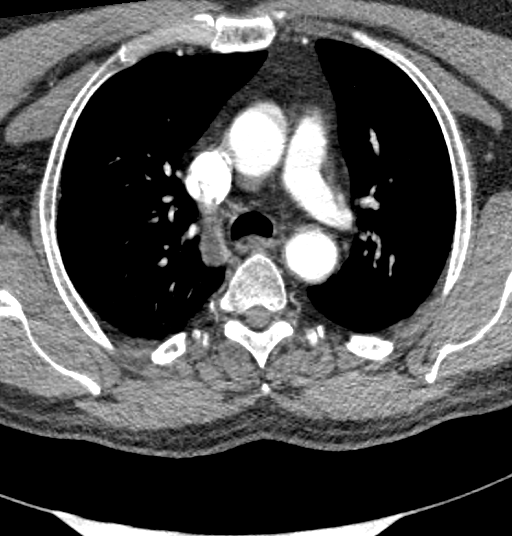
[im 151/453  bone]
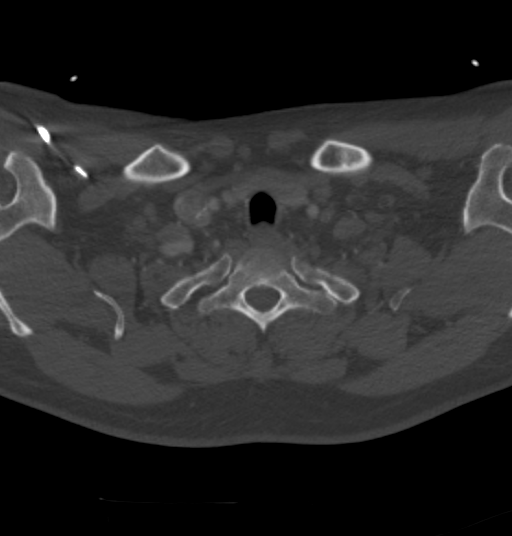
[im 227/453  soft-tissue]
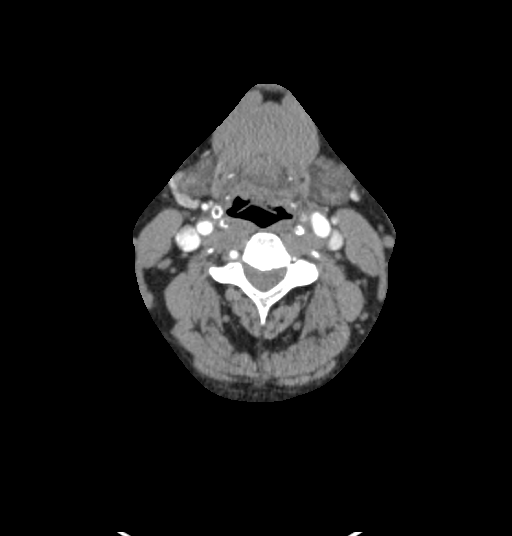
[im 302/453  bone]
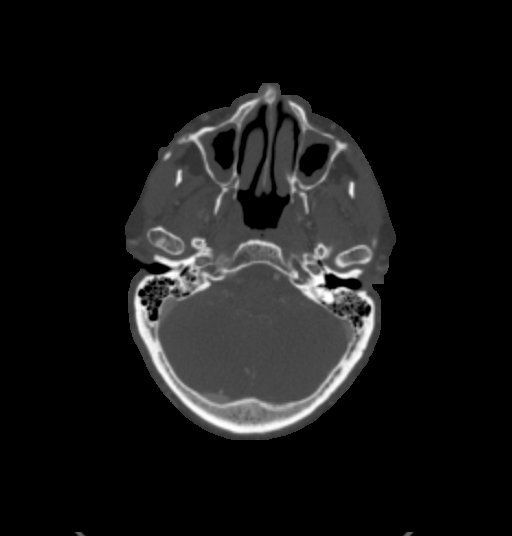
[im 377/453  soft-tissue]
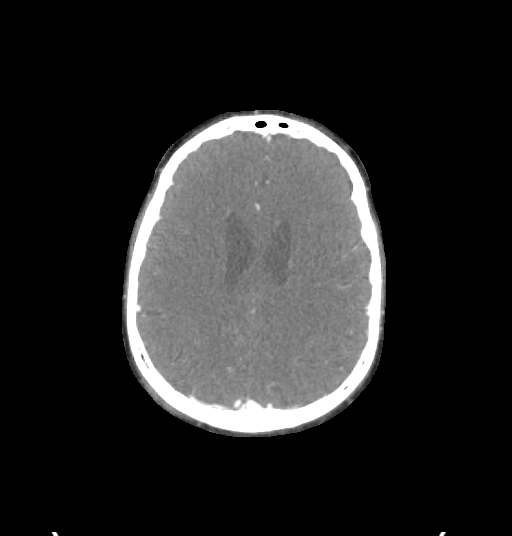

[5 of 33 positions shown; findings below may reference images not displayed]

FINDINGS: CT HEAD FINDINGS

Brain: There is no evidence of acute infarct, intracranial
hemorrhage, mass, midline shift, or extra-axial fluid collection.
The ventricles and sulci are normal.

Vascular: No hyperdense vessel.

Skull: No fracture or focal osseous lesion.

Sinuses: Moderate mucosal thickening in the ethmoid air cells
bilaterally with mild-to-moderate mucosal thickening in the ethmoid
and maxillary sinuses. Clear mastoid air cells.

Orbits: Unremarkable.

Review of the MIP images confirms the above findings

CTA NECK FINDINGS

Aortic arch: Possible normal variant aortic arch branching pattern
with the left vertebral artery arising either directly from the arch
or from the very proximal left subclavian artery, with mild motion
artifact and the left vertebral artery's small size limiting
assessment. Mild aortic atherosclerosis. Widely patent
brachiocephalic and subclavian arteries.

Right carotid system: Patent without evidence of significant or
dissection. Mild partially calcified plaque at the carotid bulb.

Left carotid system: Patent without evidence of stenosis or
dissection. Minimal calcified plaque at the carotid bifurcation.

Vertebral arteries: Patent. The right vertebral artery is strongly
dominant and widely patent. The left vertebral artery is diffusely
hypoplastic on a developmental basis without definite focal
stenosis, although its origin is not well evaluated.

Skeleton: Mild-to-moderate cervical spondylosis.

Other neck: No mass or lymph node enlargement.

Upper chest: Motion artifact through the lung apices with mild
dependent atelectasis bilaterally.

Review of the MIP images confirms the above findings

CTA HEAD FINDINGS

Anterior circulation: The internal carotid arteries are widely
patent from skullbase to carotid termini. There is a 2 mm
outpouching from the right ICA in the proximal communicating region
without a clearly resolvable vessel arising from it. The ACAs and
MCAs are patent without evidence of proximal branch occlusion or
significant proximal stenosis. The anterior communicating artery is
unremarkable.

Posterior circulation: The intracranial right vertebral artery is
widely patent and supplies the basilar. The left vertebral artery
ends in PICA. Right PICA is patent. Patent bilateral SCA origins are
identified. The basilar artery is widely patent. Posterior
communicating arteries are not clearly identified. PCAs are patent
without evidence of significant proximal stenosis. No aneurysm.

Venous sinuses: Patent.

Anatomic variants: Hypoplastic left vertebral artery which
terminates in PICA.

Delayed phase: No abnormal enhancement.

Review of the MIP images confirms the above findings
IMPRESSION: 1. No large vessel occlusion or significant stenosis.
2. Developmentally hypoplastic left vertebral artery.
3. 2 mm right ICA infundibulum versus aneurysm in the posterior
communicating region.
4. Minimal cervical carotid artery atherosclerosis.
5. Unremarkable CT appearance of the brain.
6. Moderate paranasal sinus inflammatory mucosal thickening.
7.  Aortic Atherosclerosis ([id number]).

## 2017-08-31 ENCOUNTER — Other Ambulatory Visit: Payer: Self-pay | Admitting: Family Medicine

## 2017-09-23 ENCOUNTER — Encounter: Payer: Self-pay | Admitting: Family Medicine

## 2017-09-25 NOTE — Telephone Encounter (Signed)
Notified patient to call pharmacy and see if medication was among the recall. Patient agreed and voiced understanding.

## 2017-11-18 ENCOUNTER — Other Ambulatory Visit: Payer: Self-pay | Admitting: Family Medicine

## 2017-11-29 ENCOUNTER — Telehealth: Payer: Self-pay

## 2017-11-29 DIAGNOSIS — E785 Hyperlipidemia, unspecified: Secondary | ICD-10-CM

## 2017-11-29 DIAGNOSIS — I10 Essential (primary) hypertension: Secondary | ICD-10-CM

## 2017-11-29 DIAGNOSIS — E6609 Other obesity due to excess calories: Secondary | ICD-10-CM

## 2017-11-29 DIAGNOSIS — Z125 Encounter for screening for malignant neoplasm of prostate: Secondary | ICD-10-CM

## 2017-11-29 DIAGNOSIS — D649 Anemia, unspecified: Secondary | ICD-10-CM

## 2017-11-29 NOTE — Telephone Encounter (Signed)
Please advise 

## 2017-11-29 NOTE — Telephone Encounter (Signed)
Noted.  Labs can be done before his visit though please check with him to see if he wants prostate cancer screening with a PSA on his blood work.  Thanks.

## 2017-11-29 NOTE — Telephone Encounter (Signed)
Copied from [place] [id number]. Topic: General - Other >> Nov 29, 2017 11:35 AM Synthia Innocent wrote: Reason for CRM: Requesting CPE labs to be done before appt on 01/21/18, would like order so he can have done at [facility]

## 2017-11-30 NOTE — Telephone Encounter (Signed)
Patient would like psa checked

## 2017-12-02 NOTE — Telephone Encounter (Signed)
Please schedule him for labs.

## 2017-12-02 NOTE — Telephone Encounter (Signed)
Ordered

## 2017-12-03 NOTE — Telephone Encounter (Signed)
scheduled

## 2018-01-16 ENCOUNTER — Other Ambulatory Visit: Payer: BLUE CROSS/BLUE SHIELD

## 2018-01-18 ENCOUNTER — Other Ambulatory Visit (INDEPENDENT_AMBULATORY_CARE_PROVIDER_SITE_OTHER): Payer: Managed Care, Other (non HMO)

## 2018-01-18 DIAGNOSIS — I10 Essential (primary) hypertension: Secondary | ICD-10-CM | POA: Diagnosis not present

## 2018-01-18 DIAGNOSIS — Z125 Encounter for screening for malignant neoplasm of prostate: Secondary | ICD-10-CM

## 2018-01-18 DIAGNOSIS — E6609 Other obesity due to excess calories: Secondary | ICD-10-CM

## 2018-01-18 DIAGNOSIS — E785 Hyperlipidemia, unspecified: Secondary | ICD-10-CM | POA: Diagnosis not present

## 2018-01-18 DIAGNOSIS — R710 Precipitous drop in hematocrit: Secondary | ICD-10-CM | POA: Diagnosis not present

## 2018-01-18 DIAGNOSIS — D649 Anemia, unspecified: Secondary | ICD-10-CM

## 2018-01-18 LAB — COMPREHENSIVE METABOLIC PANEL
ALT: 33 U/L (ref 0–53)
AST: 23 U/L (ref 0–37)
Albumin: 4.2 g/dL (ref 3.5–5.2)
Alkaline Phosphatase: 53 U/L (ref 39–117)
BUN: 10 mg/dL (ref 6–23)
CHLORIDE: 104 meq/L (ref 96–112)
CO2: 29 meq/L (ref 19–32)
CREATININE: 0.98 mg/dL (ref 0.40–1.50)
Calcium: 9.5 mg/dL (ref 8.4–10.5)
GFR: 82.74 mL/min (ref >60.00)
GLUCOSE: 120 mg/dL — AB (ref 70–99)
Potassium: 4 mEq/L (ref 3.5–5.1)
SODIUM: 141 meq/L (ref 135–145)
Total Bilirubin: 0.5 mg/dL (ref 0.2–1.2)
Total Protein: 6.9 g/dL (ref 6.0–8.3)

## 2018-01-18 LAB — CBC
HEMATOCRIT: 42 % (ref 39.0–52.0)
HEMOGLOBIN: 14.1 g/dL (ref 13.0–17.0)
MCHC: 33.6 g/dL (ref 30.0–36.0)
MCV: 88.2 fl (ref 78.0–100.0)
Platelets: 250 10*3/uL (ref 150.0–400.0)
RBC: 4.77 Mil/uL (ref 4.22–5.81)
RDW: 14.1 % (ref 11.5–15.5)
WBC: 7.3 10*3/uL (ref 4.0–10.5)

## 2018-01-18 LAB — PSA: PSA: 0.53 ng/mL (ref 0.10–4.00)

## 2018-01-18 LAB — LIPID PANEL
CHOL/HDL RATIO: 3
Cholesterol: 148 mg/dL (ref 0–200)
HDL: 43.4 mg/dL (ref >39.00)
LDL CALC: 79 mg/dL (ref 0–99)
NONHDL: 104.47
Triglycerides: 126 mg/dL (ref 0.0–149.0)
VLDL: 25.2 mg/dL (ref 0.0–40.0)

## 2018-01-18 LAB — HEMOGLOBIN A1C: Hgb A1c MFr Bld: 6.2 % (ref 4.6–6.5)

## 2018-01-21 ENCOUNTER — Encounter: Payer: Self-pay | Admitting: Family Medicine

## 2018-01-21 ENCOUNTER — Ambulatory Visit (INDEPENDENT_AMBULATORY_CARE_PROVIDER_SITE_OTHER): Payer: Managed Care, Other (non HMO) | Admitting: Family Medicine

## 2018-01-21 ENCOUNTER — Other Ambulatory Visit: Payer: Self-pay

## 2018-01-21 VITALS — BP 140/88 | HR 82 | Temp 97.8°F | Ht 69.0 in | Wt 227.0 lb

## 2018-01-21 DIAGNOSIS — Z0001 Encounter for general adult medical examination with abnormal findings: Secondary | ICD-10-CM

## 2018-01-21 DIAGNOSIS — Z1159 Encounter for screening for other viral diseases: Secondary | ICD-10-CM

## 2018-01-21 DIAGNOSIS — Z23 Encounter for immunization: Secondary | ICD-10-CM | POA: Diagnosis not present

## 2018-01-21 DIAGNOSIS — M7542 Impingement syndrome of left shoulder: Secondary | ICD-10-CM | POA: Insufficient documentation

## 2018-01-21 DIAGNOSIS — Z0000 Encounter for general adult medical examination without abnormal findings: Secondary | ICD-10-CM | POA: Insufficient documentation

## 2018-01-21 NOTE — Patient Instructions (Signed)
Nice to see you. Please work on diet and exercise changes. You likely have rotator cuff tendinitis or impingement.  We will give you exercises to complete.  You can try over-the-counter ibuprofen 600 mg every 8 hours as needed for the discomfort.  It may help to do this on the schedule for a couple of days.   Shoulder Impingement Syndrome Rehab Ask your health care provider which exercises are safe for you. Do exercises exactly as told by your health care provider and adjust them as directed. It is normal to feel mild stretching, pulling, tightness, or discomfort as you do these exercises, but you should stop right away if you feel sudden pain or your pain gets worse.Do not begin these exercises until told by your health care provider. Stretching and range of motion exercise This exercise warms up your muscles and joints and improves the movement and flexibility of your shoulder. This exercise also helps to relieve pain and stiffness. Exercise A: Passive horizontal adduction  1. Sit or stand and pull your left / right elbow across your chest, toward your other shoulder. Stop when you feel a gentle stretch in the back of your shoulder and upper arm. ? Keep your arm at shoulder height. ? Keep your arm as close to your body as you comfortably can. 2. Hold for __________ seconds. 3. Slowly return to the starting position. Repeat __________ times. Complete this exercise __________ times a day. Strengthening exercises These exercises build strength and endurance in your shoulder. Endurance is the ability to use your muscles for a long time, even after they get tired. Exercise B: External rotation, isometric 1. Stand or sit in a doorway, facing the door frame. 2. Bend your left / right elbow and place the back of your wrist against the door frame. Only your wrist should be touching the frame. Keep your upper arm at your side. 3. Gently press your wrist against the door frame, as if you are trying to  push your arm away from your abdomen. ? Avoid shrugging your shoulder while you press your hand against the door frame. Keep your shoulder blade tucked down toward the middle of your back. 4. Hold for __________ seconds. 5. Slowly release the tension, and relax your muscles completely before you do the exercise again. Repeat __________ times. Complete this exercise __________ times a day. Exercise C: Internal rotation, isometric  1. Stand or sit in a doorway, facing the door frame. 2. Bend your left / right elbow and place the inside of your wrist against the door frame. Only your wrist should be touching the frame. Keep your upper arm at your side. 3. Gently press your wrist against the door frame, as if you are trying to push your arm toward your abdomen. ? Avoid shrugging your shoulder while you press your hand against the door frame. Keep your shoulder blade tucked down toward the middle of your back. 4. Hold for __________ seconds. 5. Slowly release the tension, and relax your muscles completely before you do the exercise again. Repeat __________ times. Complete this exercise __________ times a day. Exercise D: Scapular protraction, supine  1. Lie on your back on a firm surface. Hold a __________ weight in your left / right hand. 2. Raise your left / right arm straight into the air so your hand is directly above your shoulder joint. 3. Push the weight into the air so your shoulder lifts off of the surface that you are lying on. Do not move  your head, neck, or back. 4. Hold for __________ seconds. 5. Slowly return to the starting position. Let your muscles relax completely before you repeat this exercise. Repeat __________ times. Complete this exercise __________ times a day. Exercise E: Scapular retraction  1. Sit in a stable chair without armrests, or stand. 2. Secure an exercise band to a stable object in front of you so the band is at shoulder height. 3. Hold one end of the exercise  band in each hand. Your palms should face down. 4. Squeeze your shoulder blades together and move your elbows slightly behind you. Do not shrug your shoulders while you do this. 5. Hold for __________ seconds. 6. Slowly return to the starting position. Repeat __________ times. Complete this exercise __________ times a day. Exercise F: Shoulder extension  1. Sit in a stable chair without armrests, or stand. 2. Secure an exercise band to a stable object in front of you where the band is above shoulder height. 3. Hold one end of the exercise band in each hand. 4. Straighten your elbows and lift your hands up to shoulder height. 5. Squeeze your shoulder blades together and pull your hands down to the sides of your thighs. Stop when your hands are straight down by your sides. Do not let your hands go behind your body. 6. Hold for __________ seconds. 7. Slowly return to the starting position. Repeat __________ times. Complete this exercise __________ times a day. This information is not intended to replace advice given to you by your health care provider. Make sure you discuss any questions you have with your health care provider. Document Released: 10/09/2005 Document Revised: 06/15/2016 Document Reviewed: 09/11/2015 Elsevier Interactive Patient Education  [employer].

## 2018-01-21 NOTE — Assessment & Plan Note (Signed)
Symptoms and exam consistent with rotator cuff impingement.  Discussed options of home exercises versus physical therapy.  Patient opted for home exercises.  Discussed the potential for an injection in the future though given that his function is normal I would hold off on this now.  He can try ibuprofen 600 mg every 8 hours as needed for pain.  Discussed taking this with food.  If not improving he will follow-up US.

## 2018-01-21 NOTE — Addendum Note (Signed)
Addended by: Juanda Chance on: 01/21/2018 10:51 AM   Modules accepted: Orders

## 2018-01-21 NOTE — Assessment & Plan Note (Signed)
Physical exam completed.  Lab work done previously and this was reviewed with the patient.  He is prediabetic.  Cholesterol is well controlled.  PSA is normal.  Discussed diet and exercise.  Hepatitis C testing will be completed today.  He declined HIV testing.  Tetanus vaccination given as well as Shingrix.  Discussed lung cancer screening given his history though he deferred this.  I discussed increasing his amlodipine for his blood pressure though he opted to monitor and he will contact us if not consistently less than 130 at home.  Would increase amlodipine to 10 mg daily.

## 2018-01-21 NOTE — Progress Notes (Signed)
[name], MD Phone: [phone]  Collin Mills is a 62 y.o. male who presents today for f/u.  Exercises 3 days a week.  Goes to gym. Eats too much food.  Mix of good food and bad food.  He wants to cut some things out. Needs hepatitis C screening. Declines HIV screening. Tetanus vaccination due.  Flu vaccination up-to-date.  Shingrix due. Colonoscopy up-to-date. PSA recently normal. Quit smoking in 2004 after smoking 2 packs/day for 36 years. 6 alcoholic beverages a week.  No illicit drug use. Blood pressure runs anywhere from 130-150 over low 80s.  He notes for the last month or so he has had some left shoulder discomfort.  Describes it as a stabbing pain.  Occurs in his left anterior shoulder with certain movements particularly if he reaches out or abducts his shoulder.  No injury.  He has not done any interventions for this.  Active Ambulatory Problems    Diagnosis Date Noted  . Osteoarthritis 12/28/2015  . Allergic rhinitis 12/28/2015  . Essential hypertension 12/28/2015  . Hyperlipidemia 12/28/2015  . Abdominal fullness 04/03/2016  . Obesity 08/02/2016  . Change in vision 05/23/2017  . Cerebral aneurysm 06/13/2017  . Encounter for general adult medical examination with abnormal findings 01/21/2018  . Rotator cuff impingement syndrome of left shoulder 01/21/2018   Resolved Ambulatory Problems    Diagnosis Date Noted  . Frequent urination 12/28/2015  . Bilateral lower extremity edema 04/03/2016  . Cough 08/02/2016  . Acute bronchitis 09/05/2016  . Dizziness 05/23/2017   Past Medical History:  Diagnosis Date  . Allergic rhinitis   . Allergy   . Anemia   . Barrett's esophagus   . Chickenpox   . Colon polyps   . GERD (gastroesophageal reflux disease)   . Hyperlipidemia   . Hypertension   . Migraines   . Osteoarthritis     Family History  Problem Relation Age of Onset  . Lung cancer Unknown   . Stroke Unknown   . Hypertension Unknown   . Lung  cancer Mother   . Lung cancer Father   . Stroke Father   . Colon cancer Neg Hx     Social History   Socioeconomic History  . Marital status: Married    Spouse name: Not on file  . Number of children: Not on file  . Years of education: Not on file  . Highest education level: Not on file  Occupational History  . Not on file  Social Needs  . Financial resource strain: Not on file  . Food insecurity:    Worry: Not on file    Inability: Not on file  . Transportation needs:    Medical: Not on file    Non-medical: Not on file  Tobacco Use  . Smoking status: Former Research scientist (life sciences)  . Smokeless tobacco: Never Used  Substance and Sexual Activity  . Alcohol use: Yes    Alcohol/week: 3.0 oz    Types: 5 Shots of liquor per week  . Drug use: No  . Sexual activity: Not on file  Lifestyle  . Physical activity:    Days per week: Not on file    Minutes per session: Not on file  . Stress: Not on file  Relationships  . Social connections:    Talks on phone: Not on file    Gets together: Not on file    Attends religious service: Not on file    Active member of club or organization: Not on file  Attends meetings of clubs or organizations: Not on file    Relationship status: Not on file  . Intimate partner violence:    Fear of current or ex partner: Not on file    Emotionally abused: Not on file    Physically abused: Not on file    Forced sexual activity: Not on file  Other Topics Concern  . Not on file  Social History Narrative  . Not on file    ROS  General:  Negative for nexplained weight loss, fever Skin: Negative for new or changing mole, sore that won't heal HEENT: Negative for trouble hearing, trouble seeing, ringing in ears, mouth sores, hoarseness, change in voice, dysphagia. CV:  Negative for chest pain, dyspnea, edema, palpitations Resp: Negative for cough, dyspnea, hemoptysis GI: Negative for nausea, vomiting, diarrhea, constipation, abdominal pain, melena,  hematochezia. GU: Negative for dysuria, incontinence, urinary hesitance, hematuria, vaginal or penile discharge, polyuria, sexual difficulty, lumps in testicle or breasts MSK: Negative for muscle cramps or aches, positive for joint pain or swelling Neuro: Negative for headaches, weakness, numbness, dizziness, passing out/fainting Psych: Negative for depression, anxiety, memory problems  Objective  Physical Exam Vitals:   01/21/18 0955  BP: 140/88  Pulse: 82  Temp: 97.8 F (36.6 C)  SpO2: 95%    BP Readings from Last 3 Encounters:  01/21/18 140/88  08/14/17 122/80  07/12/17 128/80   Wt Readings from Last 3 Encounters:  01/21/18 227 lb (103 kg)  07/12/17 227 lb 9.6 oz (103.2 kg)  06/13/17 231 lb 2 oz (104.8 kg)    Physical Exam  Constitutional: No distress.  HENT:  Head: Normocephalic and atraumatic.  Mouth/Throat: Oropharynx is clear and moist. No oropharyngeal exudate.  Eyes: Pupils are equal, round, and reactive to light. Conjunctivae are normal.  Cardiovascular: Normal rate, regular rhythm and normal heart sounds.  Pulmonary/Chest: Effort normal and breath sounds normal.  Abdominal: Soft. Bowel sounds are normal. He exhibits no distension. There is no tenderness. There is no rebound and no guarding.  Genitourinary: Rectum normal and prostate normal.  Musculoskeletal: He exhibits no edema.  Bilateral shoulders with full range of motion with no tenderness, left shoulder with discomfort on active and passive abduction though no discomfort on internal or external rotation actively or passively, positive empty can on the left, negative empty can on the right, negative speeds bilaterally  Neurological: He is alert. Gait normal.  Skin: Skin is warm and dry. He is not diaphoretic.  Psychiatric: Mood and affect normal.     Assessment/Plan:   Encounter for general adult medical examination with abnormal findings Physical exam completed.  Lab work done previously and this was  reviewed with the patient.  He is prediabetic.  Cholesterol is well controlled.  PSA is normal.  Discussed diet and exercise.  Hepatitis C testing will be completed today.  He declined HIV testing.  Tetanus vaccination given as well as Shingrix.  Discussed lung cancer screening given his history though he deferred this.  I discussed increasing his amlodipine for his blood pressure though he opted to monitor and he will contact us if not consistently less than 130 at home.  Would increase amlodipine to 10 mg daily.  Rotator cuff impingement syndrome of left shoulder Symptoms and exam consistent with rotator cuff impingement.  Discussed options of home exercises versus physical therapy.  Patient opted for home exercises.  Discussed the potential for an injection in the future though given that his function is normal I would hold off  on this now.  He can try ibuprofen 600 mg every 8 hours as needed for pain.  Discussed taking this with food.  If not improving he will follow-up US.   Orders Placed This Encounter  Procedures  . Hepatitis C Antibody    No orders of the defined types were placed in this encounter.    [name], MD [place]

## 2018-01-22 LAB — HEPATITIS C ANTIBODY
HEP C AB: NONREACTIVE
SIGNAL TO CUT-OFF: 0.01 (ref <1.00)

## 2018-01-24 ENCOUNTER — Other Ambulatory Visit: Payer: Self-pay | Admitting: Family Medicine

## 2018-01-24 DIAGNOSIS — I10 Essential (primary) hypertension: Secondary | ICD-10-CM

## 2018-01-24 MED ORDER — AMLODIPINE BESYLATE 5 MG PO TABS: 5.0000 mg | ORAL_TABLET | Freq: Every day | ORAL | 0 refills | Status: DC

## 2018-01-24 MED ORDER — LOSARTAN POTASSIUM 100 MG PO TABS: 100.0000 mg | ORAL_TABLET | Freq: Every day | ORAL | 0 refills | Status: DC

## 2018-02-23 ENCOUNTER — Other Ambulatory Visit: Payer: Self-pay | Admitting: Family Medicine

## 2018-04-20 ENCOUNTER — Other Ambulatory Visit: Payer: Self-pay | Admitting: Family Medicine

## 2018-04-25 ENCOUNTER — Other Ambulatory Visit: Payer: Self-pay | Admitting: Family Medicine

## 2018-05-10 ENCOUNTER — Other Ambulatory Visit: Payer: Self-pay | Admitting: Family

## 2018-05-10 DIAGNOSIS — I10 Essential (primary) hypertension: Secondary | ICD-10-CM

## 2018-05-24 ENCOUNTER — Encounter: Payer: Self-pay | Admitting: Family Medicine

## 2018-05-24 ENCOUNTER — Other Ambulatory Visit: Payer: Self-pay | Admitting: Family Medicine

## 2018-07-15 ENCOUNTER — Encounter: Payer: Self-pay | Admitting: Family Medicine

## 2018-07-15 ENCOUNTER — Ambulatory Visit: Payer: Managed Care, Other (non HMO) | Admitting: Family Medicine

## 2018-07-15 VITALS — BP 134/86 | HR 89 | Temp 98.2°F | Ht 69.0 in | Wt 228.0 lb

## 2018-07-15 DIAGNOSIS — R7303 Prediabetes: Secondary | ICD-10-CM | POA: Diagnosis not present

## 2018-07-15 DIAGNOSIS — R42 Dizziness and giddiness: Secondary | ICD-10-CM

## 2018-07-15 DIAGNOSIS — Z23 Encounter for immunization: Secondary | ICD-10-CM | POA: Diagnosis not present

## 2018-07-15 DIAGNOSIS — E6609 Other obesity due to excess calories: Secondary | ICD-10-CM

## 2018-07-15 DIAGNOSIS — I10 Essential (primary) hypertension: Secondary | ICD-10-CM

## 2018-07-15 DIAGNOSIS — I671 Cerebral aneurysm, nonruptured: Secondary | ICD-10-CM

## 2018-07-15 DIAGNOSIS — E119 Type 2 diabetes mellitus without complications: Secondary | ICD-10-CM | POA: Insufficient documentation

## 2018-07-15 DIAGNOSIS — Z6833 Body mass index (BMI) 33.0-33.9, adult: Secondary | ICD-10-CM

## 2018-07-15 MED ORDER — OMEPRAZOLE 40 MG PO CPDR: 40.0000 mg | DELAYED_RELEASE_CAPSULE | Freq: Every day | ORAL | 3 refills | Status: DC

## 2018-07-15 MED ORDER — MECLIZINE HCL 25 MG PO TABS: 25.0000 mg | ORAL_TABLET | Freq: Three times a day (TID) | ORAL | 0 refills | Status: DC | PRN

## 2018-07-15 MED ORDER — PRAVASTATIN SODIUM 40 MG PO TABS: 40.0000 mg | ORAL_TABLET | Freq: Every day | ORAL | 2 refills | Status: DC

## 2018-07-15 MED ORDER — HYDROCHLOROTHIAZIDE 25 MG PO TABS: 25.0000 mg | ORAL_TABLET | Freq: Every day | ORAL | 1 refills | Status: DC

## 2018-07-15 MED ORDER — AMLODIPINE BESYLATE 10 MG PO TABS: 10.0000 mg | ORAL_TABLET | Freq: Every day | ORAL | 1 refills | Status: DC

## 2018-07-15 NOTE — Assessment & Plan Note (Signed)
Consistently above goal.  We will increase his amlodipine.  He will return in 1 month for BP check with nursing.

## 2018-07-15 NOTE — Assessment & Plan Note (Signed)
He has not had any recurrence though he would like to have Antivert on hand in case it recurs.

## 2018-07-15 NOTE — Patient Instructions (Addendum)
Nice to see you. We are going to increase your amlodipine to 10 mg daily.  We will have you return in 1 month for a BP check. Please try to take 2 or 3 dinners per week and took them at home instead of eating out.  Please do something similar for lunch.  Please continue exercising.  Diet Recommendations  Starchy (carb) foods: Bread, rice, pasta, potatoes, corn, cereal, grits, crackers, bagels, muffins, all baked goods.  (Fruits, milk, and yogurt also have carbohydrate, but most of these foods will not spike your blood sugar as the starchy foods will.)  A few fruits do cause high blood sugars; use small portions of bananas (limit to 1/2 at a time), grapes, watermelon, oranges, and most tropical fruits.    Protein foods: Meat, fish, poultry, eggs, dairy foods, and beans such as pinto and kidney beans (beans also provide carbohydrate).   1. Eat at least 3 meals and 1-2 snacks per day. Never go more than 4-5 hours while awake without eating. Eat breakfast within the first hour of getting up.   2. Limit starchy foods to TWO per meal and ONE per snack. ONE portion of a starchy  food is equal to the following:   - ONE slice of bread (or its equivalent, such as half of a hamburger bun).   - 1/2 cup of a "scoopable" starchy food such as potatoes or rice.   - 15 grams of carbohydrate as shown on food label.  3. Include at every meal: a protein food, a carb food, and vegetables and/or fruit.   - Obtain twice the volume of veg's as protein or carbohydrate foods for both lunch and dinner.   - Fresh or frozen veg's are best.   - Keep frozen veg's on hand for a quick vegetable serving.

## 2018-07-15 NOTE — Progress Notes (Signed)
Collin Rumps, MD Phone: [phone]  Collin Mills is a 62 y.o. male who presents today for f/u.  CC: htn, obesity, dizziness, cerebral aneuysm  HYPERTENSION  Disease Monitoring  Home BP Monitoring >140/80 Chest pain- no    Dyspnea- no Medications  Compliance-  Taking amlodipine, HCTZ, losartan.  Edema- no  Obesity: Patient goes the gym 3 days a week and does fairly high intensity weight lifting and gets on the treadmill.  He eats boiled eggs in the morning.  He will typically eat lunch out with [employer] roast beef sandwich and fries.  Sometimes will take a sandwich from home.  Typically dinner is eaten out at [employer].  He tries to eat a meat and vegetables.  He always drinks water.  Chips and bananas for snacks.  Cerebral aneurysm: He has not had any recurrent dizziness since his episode last year.  He did see neurology recently.  They plan for follow-up on his cerebral annular next year.  No headaches.  No Antivert use.    Social History   Tobacco Use  Smoking Status Former Smoker  Smokeless Tobacco Never Used     ROS see history of present illness  Objective  Physical Exam Vitals:   07/15/18 1447  BP: 134/86  Pulse: 89  Temp: 98.2 F (36.8 C)  SpO2: 96%    BP Readings from Last 3 Encounters:  07/15/18 134/86  01/21/18 140/88  08/14/17 122/80   Wt Readings from Last 3 Encounters:  07/15/18 228 lb (103.4 kg)  01/21/18 227 lb (103 kg)  07/12/17 227 lb 9.6 oz (103.2 kg)    Physical Exam  Constitutional: No distress.  Cardiovascular: Normal rate, regular rhythm and normal heart sounds.  Pulmonary/Chest: Effort normal and breath sounds normal.  Musculoskeletal: He exhibits no edema.  Neurological: He is alert.  Skin: Skin is warm and dry. He is not diaphoretic.     Assessment/Plan: Please see individual problem list.  Prediabetes Check A1c.  Work on diet and exercise.  Obesity He will continue exercise.  Discussed cooking at home several meals a  week to start with.  Dietary guidelines given.  Cerebral aneurysm No headaches.  He will continue to monitor with neurology.  Essential hypertension Consistently above goal.  We will increase his amlodipine.  He will return in 1 month for BP check with nursing.  Dizziness He has not had any recurrence though he would like to have Antivert on hand in case it recurs.   Health Maintenance: Flu shot given today.  Orders Placed This Encounter  Procedures  . Flu Vaccine QUAD 6+ mos PF IM (Fluarix Quad PF)  . Varicella-zoster vaccine IM (Shingrix)  . Basic Metabolic Panel (BMET)  . HgB A1c    Meds ordered this encounter  Medications  . amLODipine (NORVASC) 10 MG tablet    Sig: Take 1 tablet (10 mg total) by mouth daily.    Dispense:  90 tablet    Refill:  1  . DISCONTD: meclizine (ANTIVERT) 25 MG tablet    Sig: Take 1 tablet (25 mg total) by mouth 3 (three) times daily as needed for dizziness.    Dispense:  30 tablet    Refill:  0  . omeprazole (PRILOSEC) 40 MG capsule    Sig: Take 1 capsule (40 mg total) by mouth daily.    Dispense:  90 capsule    Refill:  3  . pravastatin (PRAVACHOL) 40 MG tablet    Sig: Take 1 tablet (40 mg total)  by mouth daily.    Dispense:  90 tablet    Refill:  2  . hydrochlorothiazide (HYDRODIURIL) 25 MG tablet    Sig: Take 1 tablet (25 mg total) by mouth daily.    Dispense:  90 tablet    Refill:  1  . meclizine (ANTIVERT) 25 MG tablet    Sig: Take 1 tablet (25 mg total) by mouth 3 (three) times daily as needed for dizziness.    Dispense:  30 tablet    Refill:  0     Collin Rumps, MD [place]

## 2018-07-15 NOTE — Assessment & Plan Note (Signed)
He will continue exercise.  Discussed cooking at home several meals a week to start with.  Dietary guidelines given.

## 2018-07-15 NOTE — Assessment & Plan Note (Signed)
Check A1c.  Work on diet and exercise.

## 2018-07-15 NOTE — Assessment & Plan Note (Signed)
No headaches.  He will continue to monitor with neurology.

## 2018-07-16 LAB — HEMOGLOBIN A1C
Est. average glucose Bld gHb Est-mCnc: 123 mg/dL
Hgb A1c MFr Bld: 5.9 % — ABNORMAL HIGH (ref 4.8–5.6)

## 2018-07-16 LAB — BASIC METABOLIC PANEL
BUN/Creatinine Ratio: 10 (ref 10–24)
BUN: 11 mg/dL (ref 8–27)
CO2: 25 mmol/L (ref 20–29)
CREATININE: 1.08 mg/dL (ref 0.76–1.27)
Calcium: 9.7 mg/dL (ref 8.6–10.2)
Chloride: 104 mmol/L (ref 96–106)
GFR, EST AFRICAN AMERICAN: 85 mL/min/{1.73_m2} (ref >59)
GFR, EST NON AFRICAN AMERICAN: 74 mL/min/{1.73_m2} (ref >59)
Glucose: 191 mg/dL — ABNORMAL HIGH (ref 65–99)
POTASSIUM: 4.2 mmol/L (ref 3.5–5.2)
Sodium: 143 mmol/L (ref 134–144)

## 2018-07-20 ENCOUNTER — Other Ambulatory Visit: Payer: Self-pay | Admitting: Family Medicine

## 2018-07-31 ENCOUNTER — Encounter: Payer: Self-pay | Admitting: Family Medicine

## 2018-08-05 ENCOUNTER — Encounter: Payer: Self-pay | Admitting: Family Medicine

## 2018-08-12 ENCOUNTER — Encounter: Payer: Self-pay | Admitting: Family Medicine

## 2018-08-12 ENCOUNTER — Telehealth: Payer: Self-pay | Admitting: Family Medicine

## 2018-08-12 ENCOUNTER — Ambulatory Visit: Payer: Managed Care, Other (non HMO) | Admitting: Family Medicine

## 2018-08-12 VITALS — BP 148/98 | HR 80 | Temp 97.7°F | Ht 68.5 in | Wt 230.2 lb

## 2018-08-12 DIAGNOSIS — E6609 Other obesity due to excess calories: Secondary | ICD-10-CM

## 2018-08-12 DIAGNOSIS — I10 Essential (primary) hypertension: Secondary | ICD-10-CM

## 2018-08-12 DIAGNOSIS — Z6834 Body mass index (BMI) 34.0-34.9, adult: Secondary | ICD-10-CM | POA: Diagnosis not present

## 2018-08-12 MED ORDER — AMLODIPINE BESYLATE 5 MG PO TABS: 5.0000 mg | ORAL_TABLET | Freq: Every day | ORAL | 2 refills | Status: DC

## 2018-08-12 MED ORDER — LIRAGLUTIDE -WEIGHT MANAGEMENT 18 MG/3ML ~~LOC~~ SOPN: PEN_INJECTOR | SUBCUTANEOUS | 5 refills | Status: DC

## 2018-08-12 MED ORDER — "PEN NEEDLES 1/2"" 29G X 12MM MISC": 5 refills | Status: DC

## 2018-08-12 MED ORDER — METOPROLOL SUCCINATE ER 25 MG PO TB24: 25.0000 mg | ORAL_TABLET | Freq: Every day | ORAL | 2 refills | Status: DC

## 2018-08-12 NOTE — Telephone Encounter (Signed)
Attempted to call patient - had to leave message  My question for patient is -- does he have any personal or family history or thyroid cancers, adrenal cancers, thyroid tumors or multiple endocrine neoplasia?  These things have been seen in rats when studying saxenda and we need to ask patient if he has history of any these things.  Please let me know his answer - thanks! LG

## 2018-08-12 NOTE — Telephone Encounter (Signed)
Patient that No was the answer to all questions.

## 2018-08-12 NOTE — Telephone Encounter (Signed)
Great - thanks

## 2018-08-12 NOTE — Progress Notes (Signed)
Subjective:    Patient ID: Collin Mills, male    DOB: [birth year], 62 y.o.   MRN: [id number]  HPI   Patient presents to clinic to discuss blood pressure after recent medication change.  At last visit approximately 3 weeks ago amlodipine was increased from 5 mg daily to 10 mg daily.  He continues to take losartan 100 mg once a day and hydrochlorothiazide 25 mg once daily.  Patient states ever since increasing amlodipine, he is been very jittery especially at night, has been having occasional headache that will improve with taking some Tylenol or ibuprofen.  Patient states he has not really noticed much of a change in his blood pressure readings with amlodipine increase to 10 mg.  He did have one reading of 114/78, but otherwise his readings have been in the 140s to 150s over 80s to 90s.  Patient states he does have some mild lower extremity swelling at the end of the day, but this improves with elevation of legs - he has a sleep number bed that allows him to elevate his feet while sleeping.  Patient denies any chest pain, cough, shortness of breath, palpitations, feeling faint or dizzy.  Patient would also like to discuss Saxenda.  States he has discussed this in the past with Dr. Caryl Bis to help with weight loss.  Patient states he goes to gym to 3 times a week, tries to eat overall a diet but still having difficulty losing some weight.  Patient Active Problem List   Diagnosis Date Noted  . Prediabetes 07/15/2018  . Encounter for general adult medical examination with abnormal findings 01/21/2018  . Rotator cuff impingement syndrome of left shoulder 01/21/2018  . Cerebral aneurysm 06/13/2017  . Dizziness 05/23/2017  . Change in vision 05/23/2017  . Obesity 08/02/2016  . Abdominal fullness 04/03/2016  . Osteoarthritis 12/28/2015  . Allergic rhinitis 12/28/2015  . Essential hypertension 12/28/2015  . Hyperlipidemia 12/28/2015   Social History   Tobacco Use  . Smoking status:  Former Research scientist (life sciences)  . Smokeless tobacco: Never Used  Substance Use Topics  . Alcohol use: Yes    Alcohol/week: 5.0 standard drinks    Types: 5 Shots of liquor per week   Review of Systems  Constitutional: Negative for chills, fatigue and fever.  HENT: Negative for congestion, ear pain, sinus pain and sore throat.   Eyes: Negative.   Respiratory: Negative for cough, shortness of breath and wheezing.   Cardiovascular: Negative for chest pain, palpitations and leg swelling.  Gastrointestinal: Negative for abdominal pain, diarrhea, nausea and vomiting.  Genitourinary: Negative for dysuria, frequency and urgency.  Musculoskeletal: Negative for arthralgias and myalgias.  Skin: Negative for color change, pallor and rash.  Neurological: Negative for syncope, light-headedness. +occasional headache & "jittery at night" Psychiatric/Behavioral: The patient is not nervous/anxious.       Objective:   Physical Exam  Constitutional: He is oriented to person, place, and time. He appears well-nourished. No distress.  HENT:  Head: Normocephalic and atraumatic.  Eyes: Conjunctivae and EOM are normal. No scleral icterus.  Neck: Neck supple. No tracheal deviation present.  Cardiovascular: Normal rate and regular rhythm.  Pulmonary/Chest: Effort normal and breath sounds normal. No stridor. No respiratory distress. He has no wheezes. He has no rales.  Musculoskeletal: Normal range of motion. He exhibits no edema.  Gait normal.   Neurological: He is alert and oriented to person, place, and time. No cranial nerve deficit.  Skin: Skin is warm and dry. Capillary refill  takes less than 2 seconds. He is not diaphoretic. No pallor.  Psychiatric: He has a normal mood and affect. His behavior is normal.  Nursing note and vitals reviewed.  Body mass index is 34.49 kg/m.    Vitals:   08/12/18 1001  BP: (!) 148/98  Pulse: 80  Temp: 97.7 F (36.5 C)  SpO2: 95%   Assessment & Plan:   Essential Hypertension  -- increase amlodipine dose seems to make patient feel jittery.  We will have patient go back down to the 5 mg dose of amlodipine.  We will trial addition of metoprolol XL 25 mg once daily to see if this helps control blood pressure in combination with amlodipine, losartan, hydrochlorothiazide.  Patient will continue to keep a log of his blood pressure readings.  Obesity - we will start Saxenda to help with weight loss.  He has no personal or family history of thyroid cancers, adrenal cancers, multiple endocrine neoplasias. Patient aware that this medication is a taper up to max dose of 3 mg/day.  Patient also advised to continue healthy diet with lots of lean protein and vegetables, lower carbs/lower sugars and have regular physical activity to help with weight loss as well.  Follow-up in 2 weeks with nurse for blood pressure check.  Follow-up in 4 weeks for office visit to recheck both blood pressure and weight after starting Saxenda.

## 2018-08-12 NOTE — Patient Instructions (Signed)
Stop amlodipine 10 mg, go back down to 5 mg.  Add on Toprol 25 mg once daily and we will monitor BP

## 2018-10-24 ENCOUNTER — Ambulatory Visit: Payer: Self-pay | Admitting: *Deleted

## 2018-10-24 NOTE — Telephone Encounter (Signed)
Patient called and says since starting on Metoprolol about 2 months ago, he has been experiencing off and on symptoms of dizziness, nausea, diarrhea, feeling weak and tired. He says the weakness and tiredness has kept him out of the gym for the past 45 days, which that is something he enjoys doing. His BP yesterday was 138/70 and this morning 132/68. He says his BP's have been doing good, but the symptoms he's having is concerning. I advised he was supposed to come in for a recheck of his BP 2 weeks after taking the medication, he says he wasn't aware because no one called him. I advised it's up to the patient's to schedule the follow ups when they leave the office or when they get home, that it is on the discharge paperwork given when they leave the office, he verbalized understanding. Appointment scheduled on Monday, 10/28/18 at 1100 with Philis Nettle, FNP due to no available appointments with Dr. Caryl Bis. I advised someone from the office will call if there are any recommendations after Lauren reviews this information from the call, he verbalized understanding.   Message from Sheran Luz sent at 10/24/2018 4:57 PM EST    Velva Harman returning call to nurse triage.  ----- Message from Yvette Rack sent at 10/24/2018 1:47 PM EST ----- Since wife Velva Harman pt has been taking the metoprolol succinate (TOPROL XL) 25 MG 24 hr tablet he has been feeling weak/fatigue nausea and diarrhea his BP was 138/70 yesterday      Reason for Disposition . Caller has NON-URGENT medication question about med that PCP prescribed and triager unable to answer question  Answer Assessment - Initial Assessment Questions 1. SYMPTOMS: "Do you have any symptoms?"     Dizziness off and on, weak, tired, nausea, diarrhea for the past 2 months since taking Metoprolol 2. SEVERITY: If symptoms are present, ask "Are they mild, moderate or severe?"     Mild symptoms except the tired and weakness are high moderate  Protocols used:  MEDICATION QUESTION CALL-A-AH

## 2018-10-25 NOTE — Telephone Encounter (Signed)
OK sounds fine

## 2018-10-25 NOTE — Telephone Encounter (Signed)
FYI Pt called [name]  Patient called and says since starting on Metoprolol about 2 months ago, he has been experiencing off and on symptoms of dizziness, nausea, diarrhea, feeling weak and tired. He says the weakness and tiredness has kept him out of the gym for the past 45 days, which that is something he enjoys doing. His BP yesterday was 138/70 and this morning 132/68. He says his BP's have been doing good, but the symptoms he's having is concerning. I advised he was supposed to come in for a recheck of his BP 2 weeks after taking the medication, he says he wasn't aware because no one called him. I advised it's up to the patient's to schedule the follow ups when they leave the office or when they get home, that it is on the discharge paperwork given when they leave the office, he verbalized understanding. Appointment scheduled on Monday, 10/28/18 at 1100 with Collin Nettle, Collin Mills due to no available appointments with Dr. Caryl Bis. I advised someone from the office will call if there are any recommendations after Lauren reviews this information from the call, he verbalized understanding.

## 2018-10-25 NOTE — Telephone Encounter (Signed)
FYI

## 2018-10-25 NOTE — Telephone Encounter (Signed)
Please ask patient what time of day he is taking metoprolol - if it is making him tired (which can be common with this drug class) I would have him try taking at night before bedtime. Often switching the timing of the medication at night helps a lot.

## 2018-10-25 NOTE — Telephone Encounter (Signed)
Called Pt and he stated that he has been taking the medication every morning.  I told him that you suggested that he  start taking  it at night before bedtime. Pt did not say yes or no, he just stated he has an appt on Monday with Philis Nettle and he can discuss that then.

## 2018-10-28 ENCOUNTER — Ambulatory Visit: Payer: Managed Care, Other (non HMO) | Admitting: Family Medicine

## 2018-10-28 ENCOUNTER — Encounter: Payer: Self-pay | Admitting: Family Medicine

## 2018-10-28 VITALS — BP 118/72 | HR 95 | Temp 98.2°F | Ht 68.5 in | Wt 229.8 lb

## 2018-10-28 DIAGNOSIS — R531 Weakness: Secondary | ICD-10-CM

## 2018-10-28 DIAGNOSIS — D649 Anemia, unspecified: Secondary | ICD-10-CM

## 2018-10-28 DIAGNOSIS — E6609 Other obesity due to excess calories: Secondary | ICD-10-CM

## 2018-10-28 DIAGNOSIS — I10 Essential (primary) hypertension: Secondary | ICD-10-CM

## 2018-10-28 DIAGNOSIS — D509 Iron deficiency anemia, unspecified: Secondary | ICD-10-CM

## 2018-10-28 DIAGNOSIS — Z6834 Body mass index (BMI) 34.0-34.9, adult: Secondary | ICD-10-CM

## 2018-10-28 NOTE — Progress Notes (Signed)
Subjective:    Patient ID: Collin Mills, male    DOB: [birth year], 63 y.o.   MRN: [id number]  HPI  Presents to clinic c/o feeling weak since starting on metoprolol XL 25 mg per day.  Patient states while taking the metoprolol, he noticed himself feeling drained and even with getting proper sleep did not ever feel at his normal energy level with that medication.  Patient stopped taking metoprolol on his own approximately 6 days ago, and reports that his energy level has improved back to its normal range.  Patient has been monitoring his BP at home with out taking the metoprolol.  Overall his numbers have been remaining stable.  He has had a occasional readings in the 160s over 90s, but the bulk of his readings are in the 120-130/70-80 ranges.  Denies any chest pain or palpitations.  Denies shortness of breath or wheezing.  Denies lower extremity swelling.  Patient also states that he has not yet picked up the [place] prescription, states there is an issue with his insurance according to the pharmacy, and they were supposed to send something to the office for [place] to fill out.  Patient Active Problem List   Diagnosis Date Noted  . Prediabetes 07/15/2018  . Encounter for general adult medical examination with abnormal findings 01/21/2018  . Rotator cuff impingement syndrome of left shoulder 01/21/2018  . Cerebral aneurysm 06/13/2017  . Dizziness 05/23/2017  . Change in vision 05/23/2017  . Obesity 08/02/2016  . Abdominal fullness 04/03/2016  . Osteoarthritis 12/28/2015  . Allergic rhinitis 12/28/2015  . Essential hypertension 12/28/2015  . Hyperlipidemia 12/28/2015   Social History   Tobacco Use  . Smoking status: Former Research scientist (life sciences)  . Smokeless tobacco: Never Used  Substance Use Topics  . Alcohol use: Yes    Alcohol/week: 5.0 standard drinks    Types: 5 Shots of liquor per week   Review of Systems  Constitutional: Negative for chills, fatigue and fever.  HENT: Negative for  congestion, ear pain, sinus pain and sore throat.   Eyes: Negative.   Respiratory: Negative for cough, shortness of breath and wheezing.   Cardiovascular: Negative for chest pain, palpitations and leg swelling.  Gastrointestinal: Negative for abdominal pain, diarrhea, nausea and vomiting.  Genitourinary: Negative for dysuria, frequency and urgency.  Musculoskeletal: Negative for arthralgias and myalgias.  Skin: Negative for color change, pallor and rash.  Neurological: Negative for syncope, light-headedness and headaches.  Psychiatric/Behavioral: The patient is not nervous/anxious.       Objective:   Physical Exam  Constitutional: He appears well-developed and well-nourished. No distress.  HENT:  Head: Normocephalic and atraumatic.  Eyes: Pupils are equal, round, and reactive to light. Conjunctivae and EOM are normal. No scleral icterus.  Neck: Normal range of motion. Neck supple. No tracheal deviation present.  Cardiovascular: Normal rate, regular rhythm and normal heart sounds.  Pulmonary/Chest: Effort normal and breath sounds normal. No respiratory distress. He has no wheezes. He has no rales.  Neurological: He is alert and oriented to person, place, and time.  Gait normal  Skin: Skin is warm and dry. He is not diaphoretic. No pallor.  Psychiatric: He has a normal mood and affect. His behavior is normal. Thought content normal.   Nursing note and vitals reviewed.  BP Readings from Last 3 Encounters:  08/12/18 (!) 148/98  07/15/18 134/86  01/21/18 140/88   Vitals:   10/28/18 1052  BP: 118/72  Pulse: 95  Temp: 98.2 F (36.8 C)  SpO2:  98%   Body mass index is 34.43 kg/m.     Assessment & Plan:   Weakness - weakness reported by patient has resolved since he stopped taking metoprolol.  We will get CBC and BMP to monitor blood counts and electrolytes.  Essential hypertension - patient's blood pressures are remaining stable without the metoprolol, so he can continue to  not take this.  Obesity - I will forward a message to our LPN to look into whether or not prior authorization request for Saxenda was received.  Patient will keep regularly scheduled follow-up with PCP as planned advised return to clinic sooner if any issues arise.

## 2018-10-29 ENCOUNTER — Telehealth: Payer: Self-pay | Admitting: Family Medicine

## 2018-10-29 DIAGNOSIS — Z6834 Body mass index (BMI) 34.0-34.9, adult: Principal | ICD-10-CM

## 2018-10-29 DIAGNOSIS — E6609 Other obesity due to excess calories: Secondary | ICD-10-CM

## 2018-10-29 NOTE — Telephone Encounter (Signed)
[facility] Rx was sent a few months ago, patient states he was told by pharmacy something was sent to [place] so he could get  Was prior auth request received?

## 2018-10-29 NOTE — Telephone Encounter (Signed)
I did not get this this would be something Collin Mills would receive.

## 2018-10-29 NOTE — Telephone Encounter (Signed)
Called pharmacy and they will resend the PA request authorization

## 2018-10-29 NOTE — Telephone Encounter (Signed)
I dont remember getting a prior authorization for this Rx for this Pt.

## 2018-10-29 NOTE — Telephone Encounter (Signed)
PA on saxenda?

## 2018-10-29 NOTE — Telephone Encounter (Signed)
Can we call pharmacy or insurance to see what is going on?

## 2018-10-30 LAB — CBC
Hematocrit: 29.7 % — ABNORMAL LOW (ref 37.5–51.0)
Hemoglobin: 9.6 g/dL — ABNORMAL LOW (ref 13.0–17.7)
MCH: 25.5 pg — ABNORMAL LOW (ref 26.6–33.0)
MCHC: 32.3 g/dL (ref 31.5–35.7)
MCV: 79 fL (ref 79–97)
Platelets: 470 10*3/uL — ABNORMAL HIGH (ref 150–450)
RBC: 3.77 x10E6/uL — AB (ref 4.14–5.80)
RDW: 14.4 % (ref 11.6–15.4)
WBC: 6.9 10*3/uL (ref 3.4–10.8)

## 2018-10-30 LAB — BASIC METABOLIC PANEL
BUN/Creatinine Ratio: 9 — ABNORMAL LOW (ref 10–24)
BUN: 10 mg/dL (ref 8–27)
CO2: 24 mmol/L (ref 20–29)
Calcium: 9.8 mg/dL (ref 8.6–10.2)
Chloride: 99 mmol/L (ref 96–106)
Creatinine, Ser: 1.14 mg/dL (ref 0.76–1.27)
GFR calc Af Amer: 80 mL/min/{1.73_m2} (ref >59)
GFR calc non Af Amer: 69 mL/min/{1.73_m2} (ref >59)
GLUCOSE: 127 mg/dL — AB (ref 65–99)
Potassium: 3.8 mmol/L (ref 3.5–5.2)
Sodium: 140 mmol/L (ref 134–144)

## 2018-10-30 NOTE — Addendum Note (Signed)
Addended by: Philis Nettle on: 10/30/2018 02:24 PM   Modules accepted: Orders

## 2018-11-01 ENCOUNTER — Telehealth: Payer: Self-pay

## 2018-11-01 ENCOUNTER — Telehealth: Payer: Self-pay | Admitting: *Deleted

## 2018-11-01 NOTE — Telephone Encounter (Signed)
I don't see where this medication has been prescribed since 08/12/18 ok to proceed with PA need new script .

## 2018-11-01 NOTE — Telephone Encounter (Signed)
Call and notify patient and when fax is received with Explanation we can file appeal. Notify PCP.

## 2018-11-01 NOTE — Telephone Encounter (Signed)
PA was done and was marked as   This request has received an Unfavorable outcome. Please note any additional information provided by [facility] at the bottom of your screen. You will also receive a faxed copy of the determination.   [id number]. SAXENDA INJ 18MG/3ML   KEY: [id number]

## 2018-11-01 NOTE — Telephone Encounter (Signed)
Copied from [place] [phone]. Topic: General - Other >> Nov 01, 2018 11:23 AM Yvette Rack wrote: Reason for CRM: Pat Kocher with [facility] stated she will be faxing a request regarding the prior authorization for Saxenda. Purvis Kilts stated that they need to know if pt would be using Saxenda for general lifestyle modification and also if pt has tried [employer]. Provided her with the fax # to the office

## 2018-11-02 ENCOUNTER — Other Ambulatory Visit: Payer: Self-pay | Admitting: Family Medicine

## 2018-11-04 ENCOUNTER — Telehealth: Payer: Self-pay | Admitting: Lab

## 2018-11-04 DIAGNOSIS — Z6833 Body mass index (BMI) 33.0-33.9, adult: Principal | ICD-10-CM

## 2018-11-04 DIAGNOSIS — E6609 Other obesity due to excess calories: Secondary | ICD-10-CM

## 2018-11-04 MED ORDER — NALTREXONE-BUPROPION HCL ER 8-90 MG PO TB12: ORAL_TABLET | ORAL | 2 refills | Status: DC

## 2018-11-04 NOTE — Telephone Encounter (Signed)
Please call and ask patient if he would be willing to try contrave, it is an oral medication to help with weight loss  Let him know saxenda not approved

## 2018-11-04 NOTE — Telephone Encounter (Signed)
Called Pt and told him that his Rx was sent to the pharmacy

## 2018-11-04 NOTE — Telephone Encounter (Signed)
New script is fine, I am trying to send it but it says medications work station is locked because you are accessing it so I cannot send it

## 2018-11-04 NOTE — Addendum Note (Signed)
Addended by: Philis Nettle on: 11/04/2018 12:16 PM   Modules accepted: Orders

## 2018-11-04 NOTE — Telephone Encounter (Signed)
Called Pt to tell him of denial from his insurance for Saxenda. I ask the Pt would he be willing to try a different weight loss oral medication called  Contrave, Pt stated Yes he will be willing to try it.

## 2018-11-04 NOTE — Telephone Encounter (Signed)
Pt was denied Saxenda Rx through his insurance. [facility] (case # [id number]).  Pt didn't meet guide lines example trying Contrave therapy first, diet or reducing calories, exercise, behavioral support, or community based program.  They want him to try Contrave therapy and if he cant use it they want to know why.

## 2018-11-04 NOTE — Telephone Encounter (Signed)
I am out of the chart.

## 2018-11-05 ENCOUNTER — Encounter: Payer: Self-pay | Admitting: Family Medicine

## 2018-11-05 LAB — B12 AND FOLATE PANEL
Folate: >20 ng/mL (ref >3.0)
Vitamin B-12: 440 pg/mL (ref 232–1245)

## 2018-11-05 LAB — CBC
Hematocrit: 29.2 % — ABNORMAL LOW (ref 37.5–51.0)
Hemoglobin: 9 g/dL — ABNORMAL LOW (ref 13.0–17.7)
MCH: 24.7 pg — ABNORMAL LOW (ref 26.6–33.0)
MCHC: 30.8 g/dL — ABNORMAL LOW (ref 31.5–35.7)
MCV: 80 fL (ref 79–97)
Platelets: 424 10*3/uL (ref 150–450)
RBC: 3.65 x10E6/uL — ABNORMAL LOW (ref 4.14–5.80)
RDW: 15.1 % (ref 11.6–15.4)
WBC: 8 10*3/uL (ref 3.4–10.8)

## 2018-11-05 LAB — IRON,TIBC AND FERRITIN PANEL
FERRITIN: 16 ng/mL — AB (ref 30–400)
Iron Saturation: 6 % — CL (ref 15–55)
Iron: 21 ug/dL — ABNORMAL LOW (ref 38–169)
Total Iron Binding Capacity: 381 ug/dL (ref 250–450)
UIBC: 360 ug/dL — ABNORMAL HIGH (ref 111–343)

## 2018-11-05 MED ORDER — FERROUS SULFATE 325 (65 FE) MG PO TBEC: 325.0000 mg | DELAYED_RELEASE_TABLET | Freq: Two times a day (BID) | ORAL | 3 refills | Status: DC

## 2018-11-05 NOTE — Telephone Encounter (Signed)
Sent to PCP as an [place]

## 2018-11-05 NOTE — Addendum Note (Signed)
Addended by: Philis Nettle on: 11/05/2018 01:29 PM   Modules accepted: Orders

## 2018-11-05 NOTE — Addendum Note (Signed)
Addended by: Philis Nettle on: 11/05/2018 02:31 PM   Modules accepted: Orders

## 2018-11-08 NOTE — Telephone Encounter (Addendum)
Ava would like a calling back she has some clinical questions and needs answer concerning PA for contrave

## 2018-11-11 ENCOUNTER — Other Ambulatory Visit: Payer: Self-pay

## 2018-11-11 ENCOUNTER — Inpatient Hospital Stay: Payer: Managed Care, Other (non HMO) | Attending: Oncology | Admitting: Oncology

## 2018-11-11 ENCOUNTER — Encounter: Payer: Self-pay | Admitting: Oncology

## 2018-11-11 VITALS — BP 133/80 | HR 82 | Temp 96.9°F | Resp 18 | Ht 70.0 in | Wt 231.1 lb

## 2018-11-11 DIAGNOSIS — K921 Melena: Secondary | ICD-10-CM | POA: Insufficient documentation

## 2018-11-11 DIAGNOSIS — D509 Iron deficiency anemia, unspecified: Secondary | ICD-10-CM | POA: Insufficient documentation

## 2018-11-11 DIAGNOSIS — Z79899 Other long term (current) drug therapy: Secondary | ICD-10-CM | POA: Diagnosis not present

## 2018-11-11 DIAGNOSIS — Z87891 Personal history of nicotine dependence: Secondary | ICD-10-CM

## 2018-11-11 DIAGNOSIS — D500 Iron deficiency anemia secondary to blood loss (chronic): Secondary | ICD-10-CM | POA: Insufficient documentation

## 2018-11-11 DIAGNOSIS — Z7982 Long term (current) use of aspirin: Secondary | ICD-10-CM | POA: Insufficient documentation

## 2018-11-11 DIAGNOSIS — I10 Essential (primary) hypertension: Secondary | ICD-10-CM | POA: Insufficient documentation

## 2018-11-11 HISTORY — DX: Iron deficiency anemia, unspecified: D50.9

## 2018-11-11 NOTE — Telephone Encounter (Signed)
OK great. Please have him make follow up appt here 4-6 weeks after starting medication to monitor progress

## 2018-11-11 NOTE — Progress Notes (Signed)
Hematology/Oncology Consult note [facility] [phone] [phone]   Patient Care Team: Leone Haven, MD as PCP - General (Family Medicine)  REFERRING PROVIDER: Leone Haven, MD  CHIEF COMPLAINTS/REASON FOR VISIT:  Evaluation of iron deficiency anemia  HISTORY OF PRESENTING ILLNESS:  Collin Mills is a  63 y.o.  male with PMH listed below who was referred to me for evaluation of iron deficiency anemia Reviewed patient's recent labs that was done at [facility] office. Labs revealed anemia with hemoglobin of 9.0 on 11/04/2018. Reviewed patient's previous labs ordered by primary care physician's office, anemia onset is fairly recently.  Patient had a normal hemoglobin of 14.1 on 01/18/2018. No aggravating or improving factors.  Associated signs and symptoms: Patient reports fatigue.  Denies SOB with exertion.  Denies weight loss, easy bruising, hematochezia, hemoptysis, hematuria. Context:  History of iron deficiency: Denies Rectal bleeding: Reports having hemorrhoid bleeding intermittently, recently have bright red blood in toilet.  Hematemesis or hemoptysis : denies Blood in urine : denies   Last endoscopy: Last colonoscopy was done in 2017 by Dr. Diona Foley.  Patient had multiple polyps resected.  Nonbleeding internal hemorrhoids.  Biopsy showed tubular adenoma, no dysplasia or invasive component. Fatigue: Yes.  SOB: deneis He takes aspirin 81 mg daily.  No other blood thinners. Denies unintentional weight loss, fever or chills, abdominal pain, nausea vomiting, night sweats. Denies any colon cancer family history. Review of Systems  Constitutional: Positive for fatigue. Negative for appetite change, chills, diaphoresis, fever and unexpected weight change.  HENT:   Negative for hearing loss, lump/mass, nosebleeds, sore throat and voice change.   Eyes: Negative for eye problems and icterus.  Respiratory: Negative for chest  tightness, cough, hemoptysis, shortness of breath and wheezing.   Cardiovascular: Negative for chest pain and leg swelling.  Gastrointestinal: Positive for blood in stool. Negative for abdominal distention, abdominal pain, diarrhea, nausea and rectal pain.  Endocrine: Negative for hot flashes.  Genitourinary: Negative for bladder incontinence, difficulty urinating, dysuria, frequency, hematuria and nocturia.   Musculoskeletal: Negative for arthralgias, back pain, flank pain, gait problem and myalgias.  Skin: Negative for itching and rash.  Neurological: Negative for dizziness, gait problem, headaches, light-headedness, numbness and seizures.  Hematological: Negative for adenopathy. Does not bruise/bleed easily.  Psychiatric/Behavioral: Negative for confusion and decreased concentration. The patient is not nervous/anxious.     MEDICAL HISTORY:  Past Medical History:  Diagnosis Date  . Allergic rhinitis   . Allergy   . Anemia    giving blood every six weeks  . Barrett's esophagus   . Chickenpox   . Colon polyps   . GERD (gastroesophageal reflux disease)   . Hyperlipidemia   . Hypertension   . Iron deficiency anemia 11/11/2018  . Migraines   . Osteoarthritis    osteoarthritis    SURGICAL HISTORY: Past Surgical History:  Procedure Laterality Date  . COLONOSCOPY    . ESOPHAGOGASTRODUODENOSCOPY ENDOSCOPY      SOCIAL HISTORY: Social History   Socioeconomic History  . Marital status: Married    Spouse name: Not on file  . Number of children: Not on file  . Years of education: Not on file  . Highest education level: Not on file  Occupational History  . Not on file  Social Needs  . Financial resource strain: Not on file  . Food insecurity:    Worry: Not on file    Inability: Not on file  . Transportation needs:  Medical: Not on file    Non-medical: Not on file  Tobacco Use  . Smoking status: Former Smoker    Packs/day: 2.00    Years: 36.00    Pack years: 72.00     Last attempt to quit: 2004    Years since quitting: 16.0  . Smokeless tobacco: Never Used  Substance and Sexual Activity  . Alcohol use: Yes    Alcohol/week: 5.0 standard drinks    Types: 5 Shots of liquor per week    Comment: occasional  . Drug use: No  . Sexual activity: Not on file  Lifestyle  . Physical activity:    Days per week: Not on file    Minutes per session: Not on file  . Stress: Not on file  Relationships  . Social connections:    Talks on phone: Not on file    Gets together: Not on file    Attends religious service: Not on file    Active member of club or organization: Not on file    Attends meetings of clubs or organizations: Not on file    Relationship status: Not on file  . Intimate partner violence:    Fear of current or ex partner: Not on file    Emotionally abused: Not on file    Physically abused: Not on file    Forced sexual activity: Not on file  Other Topics Concern  . Not on file  Social History Narrative  . Not on file    FAMILY HISTORY: Family History  Problem Relation Age of Onset  . Lung cancer Mother   . COPD Mother   . Lung cancer Father   . Stroke Father   . Spina bifida Sister   . Cirrhosis Brother   . Colon cancer Neg Hx     ALLERGIES:  is allergic to molds & smuts.  MEDICATIONS:  Current Outpatient Medications  Medication Sig Dispense Refill  . amLODipine (NORVASC) 5 MG tablet Take 1 tablet (5 mg total) by mouth daily. 30 tablet 2  . aspirin 81 MG tablet Take 81 mg by mouth daily.    . ferrous sulfate 325 (65 FE) MG EC tablet Take 1 tablet (325 mg total) by mouth 2 (two) times daily. 60 tablet 3  . fluticasone (FLONASE) 50 MCG/ACT nasal spray Place into both nostrils daily.    . hydrochlorothiazide (HYDRODIURIL) 25 MG tablet Take 1 tablet (25 mg total) by mouth daily. 90 tablet 1  . losartan (COZAAR) 100 MG tablet TAKE 1 TABLET(100 MG) BY MOUTH DAILY 90 tablet 1  . meclizine (ANTIVERT) 25 MG tablet Take 1 tablet (25 mg  total) by mouth 3 (three) times daily as needed for dizziness. 30 tablet 0  . Multiple Vitamin (MULTIVITAMIN) tablet Take 1 tablet by mouth daily.    . NON FORMULARY [place] Allen-tec - 1 tab daily    . omeprazole (PRILOSEC) 40 MG capsule Take 1 capsule (40 mg total) by mouth daily. 90 capsule 3  . pravastatin (PRAVACHOL) 40 MG tablet Take 1 tablet (40 mg total) by mouth daily. 90 tablet 2  . Insulin Pen Needle (PEN NEEDLES 29GX1/2") 29G X 12MM MISC Use once daily with Saxenda pen 100 each 5  . Naltrexone-buPROPion HCl ER (CONTRAVE) 8-90 MG TB12 Start 1 tab qAM x1 week, then 1 tab BID x1 week, then 2 tabs qAM and 1 tab qPM for 1 week, then 2 tabs BID (Patient not taking: Reported on 11/11/2018) 120 tablet 2   Current  Facility-Administered Medications  Medication Dose Route Frequency Provider Last Rate Last Dose  . 0.9 %  sodium chloride infusion  500 mL Intravenous Continuous Nandigam, Kavitha V, MD         PHYSICAL EXAMINATION: ECOG PERFORMANCE STATUS: 1 - Symptomatic but completely ambulatory Vitals:   11/11/18 0948  BP: 133/80  Pulse: 82  Resp: 18  Temp: (!) 96.9 F (36.1 C)   Filed Weights   11/11/18 0948  Weight: 231 lb 1.6 oz (104.8 kg)    Physical Exam Constitutional:      General: He is not in acute distress. HENT:     Head: Normocephalic and atraumatic.  Eyes:     General: No scleral icterus.    Pupils: Pupils are equal, round, and reactive to light.  Neck:     Musculoskeletal: Normal range of motion and neck supple.  Cardiovascular:     Rate and Rhythm: Normal rate and regular rhythm.     Heart sounds: Normal heart sounds.  Pulmonary:     Effort: Pulmonary effort is normal. No respiratory distress.     Breath sounds: No wheezing.  Abdominal:     General: Bowel sounds are normal. There is no distension.     Palpations: Abdomen is soft. There is no mass.     Tenderness: There is no abdominal tenderness.  Musculoskeletal: Normal range of motion.         General: No deformity.  Skin:    General: Skin is warm and dry.     Findings: No erythema or rash.  Neurological:     Mental Status: He is alert and oriented to person, place, and time.     Cranial Nerves: No cranial nerve deficit.     Coordination: Coordination normal.  Psychiatric:        Behavior: Behavior normal.        Thought Content: Thought content normal.      LABORATORY DATA:  I have reviewed the data as listed Lab Results  Component Value Date   WBC 8.0 11/04/2018   HGB 9.0 (L) 11/04/2018   HCT 29.2 (L) 11/04/2018   MCV 80 11/04/2018   PLT 424 11/04/2018   Recent Labs    01/18/18 0758 07/15/18 1519 10/29/18 0740  NA 141 143 140  K 4.0 4.2 3.8  CL 104 104 99  CO2 29 25 24   GLUCOSE 120* 191* 127*  BUN 10 11 10   CREATININE 0.98 1.08 1.14  CALCIUM 9.5 9.7 9.8  GFRNONAA  --  74 69  GFRAA  --  85 80  PROT 6.9  --   --   ALBUMIN 4.2  --   --   AST 23  --   --   ALT 33  --   --   ALKPHOS 53  --   --   BILITOT 0.5  --   --    Iron/TIBC/Ferritin/ %Sat    Component Value Date/Time   IRON 21 (L) 11/04/2018 0727   TIBC 381 11/04/2018 0727   FERRITIN 16 (L) 11/04/2018 0727   IRONPCTSAT 6 (LL) 11/04/2018 0727      ASSESSMENT & PLAN:  1. Blood in the stool   2. Iron deficiency anemia due to chronic blood loss    Labs are reviewed and discussed with patient. Consistent with iron deficiency anemia. Source of blood loss likely from GI tract.  Patient has tried oral ferrous sulfate supplements.  Given that his hemoglobin is quite low, severe iron deficiency, ongoing  blood loss from GI tract, recommend IV iron. Plan IV iron with Venofer 234m weekly x 4 doses. Allergy reactions/infusion reaction including anaphylactic reaction discussed with patient. Other side effects include but not limited to high blood pressure, skin rash, weight gain, leg swelling, etc. Patient voices understanding and willing to proceed. Patient to recheck iron, TIBC, ferritin, CBC in 8  weeks prior to his clinical visit for reevaluation of need of additional IV Venofer.  I recommend patient to call his gastroenterologist office and make a follow-up appointment for evaluation of iron deficiency anemia, blood in the stool.  Orders Placed This Encounter  Procedures  . CBC with Differential/Platelet    Standing Status:   Future    Standing Expiration Date:   11/12/2019  . Ferritin    Standing Status:   Future    Standing Expiration Date:   11/12/2019  . Iron and TIBC    Standing Status:   Future    Standing Expiration Date:   11/12/2019    All questions were answered. The patient knows to call the clinic with any problems questions or concerns.  Return of visit: 8 weeks thank you for this kind referral and the opportunity to participate in the care of this patient. A copy of today's note is routed to referring provider  Total face to face encounter time for this patient visit was 416m. >50% of the time was  spent in counseling and coordination of care.    ZhEarlie ServerMD, PhD Hematology Oncology [facility]- [phone]/20/2020

## 2018-11-11 NOTE — Progress Notes (Signed)
Patient here for follow up. Pt states states feeling tired most of the time.

## 2018-11-11 NOTE — Telephone Encounter (Signed)
[employer] Rx Approved Pt 11/11/2018-05/12/2019 Contrave Tablet 8-90 Mg

## 2018-11-11 NOTE — Telephone Encounter (Signed)
Called Pt and told him he was approved for the Contrave and that once he start taking it he needs to schedule an appt here in 4-6 wks to monitor the progress. Pt stated he understood and will do so.

## 2018-11-11 NOTE — Telephone Encounter (Signed)
Medication approved

## 2018-11-13 ENCOUNTER — Encounter: Payer: Self-pay | Admitting: Physician Assistant

## 2018-11-13 ENCOUNTER — Other Ambulatory Visit: Payer: Self-pay | Admitting: Physician Assistant

## 2018-11-13 ENCOUNTER — Ambulatory Visit: Payer: Managed Care, Other (non HMO) | Admitting: Physician Assistant

## 2018-11-13 ENCOUNTER — Encounter: Payer: Self-pay | Admitting: Gastroenterology

## 2018-11-13 VITALS — BP 110/60 | HR 76 | Ht 68.0 in | Wt 229.2 lb

## 2018-11-13 DIAGNOSIS — Z8719 Personal history of other diseases of the digestive system: Secondary | ICD-10-CM

## 2018-11-13 DIAGNOSIS — R1032 Left lower quadrant pain: Secondary | ICD-10-CM | POA: Diagnosis not present

## 2018-11-13 DIAGNOSIS — R1012 Left upper quadrant pain: Secondary | ICD-10-CM

## 2018-11-13 DIAGNOSIS — D509 Iron deficiency anemia, unspecified: Secondary | ICD-10-CM

## 2018-11-13 DIAGNOSIS — Z1211 Encounter for screening for malignant neoplasm of colon: Secondary | ICD-10-CM | POA: Diagnosis not present

## 2018-11-13 DIAGNOSIS — Z86010 Personal history of colonic polyps: Secondary | ICD-10-CM

## 2018-11-13 MED ORDER — NA SULFATE-K SULFATE-MG SULF 17.5-3.13-1.6 GM/177ML PO SOLN: ORAL | 0 refills | Status: DC

## 2018-11-13 NOTE — Progress Notes (Signed)
Subjective:    Patient ID: Collin Mills, male    DOB: [birth year], 63 y.o.   MRN: [id number]  HPI Collin Mills is a pleasant 63 year old white male, known to Dr. Silverio Decamp, who is referred back today by Philis Nettle, NP-Eric Caryl Bis, MD for new finding of iron deficiency anemia. She was last seen here for colonoscopy in August 2017, and was found to have 6 polyps total the largest 5 to 9 mm All were removed and path showed tubular adenomas and hyperplastic polyps.  He was recommended to have 3-year interval follow-up. Patient has not had EGD here, he says that he did have prior EGD in [facility] done by Dr. Jake Michaelis about 15 years ago and was told that he had Barrett's esophagus.  He has not had repeat EGD since. Patient was also referred to oncology with diagnosis of iron deficiency anemia and saw Dr.Yu yesterday.  He tells me that he is set up for 4 iron infusions the first which will be done next week. He has also been started on oral iron sulfate 325 twice daily. Patient denies any regular aspirin or NSAID use.  Family history is negative for colon cancer as far as he is aware. He says his symptoms started in November 2019 ,with fatigue which is been somewhat progressive.  He says he gets winded easily.  He has not had any chest pain.  He has not noted any melena or hematochezia but says his stools have been dark since he was on iron.  Several weeks ago he did note some blood on the tissue which continued for several days and then resolved. He has noted some intermittent mid abdominal discomfort recently as well. He has been maintained on omeprazole 40 mg p.o. daily long-term for reflux symptoms says this works fairly well.  He denies any dysphagia or odynophagia.  His weight has been stable.  Most recent labs from 10/30/2018 showed hemoglobin 9.6 hematocrit 29.7 MCV of 79 platelets 470, B12 and folate within normal limits, serum iron 21, iron saturation 6, TIBC 381 and ferritin of  16.  Hemoglobin repeated on 11/04/2018 was down to 9 with hematocrit of 29.29 Reviewing past labs hemoglobin was normal at 14 in March 2019.  Review of Systems Pertinent positive and negative review of systems were noted in the above HPI section.  All other review of systems was otherwise negative.  Outpatient Encounter Medications as of 11/13/2018  Medication Sig  . amLODipine (NORVASC) 5 MG tablet Take 1 tablet (5 mg total) by mouth daily.  Marland Kitchen aspirin 81 MG tablet Take 81 mg by mouth daily.  . ferrous sulfate 325 (65 FE) MG EC tablet Take 1 tablet (325 mg total) by mouth 2 (two) times daily.  . fluticasone (FLONASE) 50 MCG/ACT nasal spray Place into both nostrils daily.  . hydrochlorothiazide (HYDRODIURIL) 25 MG tablet Take 1 tablet (25 mg total) by mouth daily.  Marland Kitchen losartan (COZAAR) 100 MG tablet TAKE 1 TABLET(100 MG) BY MOUTH DAILY  . meclizine (ANTIVERT) 25 MG tablet Take 1 tablet (25 mg total) by mouth 3 (three) times daily as needed for dizziness.  . Multiple Vitamin (MULTIVITAMIN) tablet Take 1 tablet by mouth daily.  . NON FORMULARY [employer] Allen-tec - 1 tab daily  . omeprazole (PRILOSEC) 40 MG capsule Take 1 capsule (40 mg total) by mouth daily.  . pravastatin (PRAVACHOL) 40 MG tablet Take 1 tablet (40 mg total) by mouth daily.  . Na Sulfate-K Sulfate-Mg Sulf 17.5-3.13-1.6 GM/177ML SOLN Take  as directed for colonoscopy prep.  . Naltrexone-buPROPion HCl ER (CONTRAVE) 8-90 MG TB12 Start 1 tab qAM x1 week, then 1 tab BID x1 week, then 2 tabs qAM and 1 tab qPM for 1 week, then 2 tabs BID (Patient not taking: Reported on 11/11/2018)  . [DISCONTINUED] Insulin Pen Needle (PEN NEEDLES 29GX1/2") 29G X 12MM MISC Use once daily with Saxenda pen   Facility-Administered Encounter Medications as of 11/13/2018  Medication  . 0.9 %  sodium chloride infusion   Allergies  Allergen Reactions  . Molds & Smuts     Itching and sneezing    Patient Active Problem List   Diagnosis Date Noted  . Hx of  adenomatous colonic polyps 11/13/2018  . Iron deficiency anemia 11/11/2018  . Prediabetes 07/15/2018  . Encounter for general adult medical examination with abnormal findings 01/21/2018  . Rotator cuff impingement syndrome of left shoulder 01/21/2018  . Cerebral aneurysm 06/13/2017  . Dizziness 05/23/2017  . Change in vision 05/23/2017  . Obesity 08/02/2016  . Abdominal fullness 04/03/2016  . Osteoarthritis 12/28/2015  . Allergic rhinitis 12/28/2015  . Essential hypertension 12/28/2015  . Hyperlipidemia 12/28/2015   Social History   Socioeconomic History  . Marital status: Married    Spouse name: Not on file  . Number of children: Not on file  . Years of education: Not on file  . Highest education level: Not on file  Occupational History  . Not on file  Social Needs  . Financial resource strain: Not on file  . Food insecurity:    Worry: Not on file    Inability: Not on file  . Transportation needs:    Medical: Not on file    Non-medical: Not on file  Tobacco Use  . Smoking status: Former Smoker    Packs/day: 2.00    Years: 36.00    Pack years: 72.00    Last attempt to quit: 2004    Years since quitting: 16.0  . Smokeless tobacco: Never Used  Substance and Sexual Activity  . Alcohol use: Yes    Alcohol/week: 5.0 standard drinks    Types: 5 Shots of liquor per week    Comment: occasional  . Drug use: No  . Sexual activity: Not on file  Lifestyle  . Physical activity:    Days per week: Not on file    Minutes per session: Not on file  . Stress: Not on file  Relationships  . Social connections:    Talks on phone: Not on file    Gets together: Not on file    Attends religious service: Not on file    Active member of club or organization: Not on file    Attends meetings of clubs or organizations: Not on file    Relationship status: Not on file  . Intimate partner violence:    Fear of current or ex partner: Not on file    Emotionally abused: Not on file     Physically abused: Not on file    Forced sexual activity: Not on file  Other Topics Concern  . Not on file  Social History Narrative  . Not on file    Mr. Whetzel's family history includes COPD in his mother; Cirrhosis in his brother; Lung cancer in his father and mother; Spina bifida in his sister; Stroke in his father.      Objective:    Vitals:   11/13/18 0929  BP: 110/60  Pulse: 76    Physical Exam; well-developed  white male in no acute distress, pleasant, height 5 foot 8, weight 229, BMI 34.8.  HEENT ;nontraumatic normocephalic EOMI PERRLA sclera anicteric oral mucosa moist, Cardiovascular ;regular rate and rhythm with S1-S2.  Pulmonary ;clear bilaterally.  Abdomen ;soft, bowel sounds are present there is no palpable mass or hepatosplenomegaly, he is tender in the left lower quadrant and into the suprapubic area no guarding.  Rectal ;exam not done today, Extremities; no clubbing cyanosis or edema skin warm and dry, Neuropsych ;alert and oriented, grossly nonfocal mood and affect appropriate       Assessment & Plan:   #24 63 year old white male with new diagnosis of iron deficiency anemia.  Patient has been symptomatic with fatigue, and dyspnea on exertion. Hemoglobin was 9.6 on January 8, and down to 9 on November 04, 2018.  No overt GI bleeding.  We will need to rule out occult colon neoplasm, versus gastric or small bowel lesion.  Patient also has history of Barrett's esophagus, no concerning symptoms for esophageal malignancy at present.  #2 history of tubular adenomatous colon polyps and last colonoscopy August 2017 #3 very remote history of Barrett's esophagus with no interval follow-up #4 hypertension  Plan; Patient will be scheduled for colonoscopy and upper endoscopy with Dr. Silverio Decamp   We discussed both procedures in detail including indications risks and benefits and he is agreeable to proceed.  If endoscopic evaluation is negative he will need capsule  endoscopy. Continue omeprazole 40 mg p.o. every morning We will continue oral iron supplement twice daily for now, and as above has been seen by hematology and is set up for iron infusions. We will repeat CBC today since he had had a drop over the past couple of weeks.  Amy Genia Harold PA-C 11/13/2018   Cc: Jodelle Green, FNP

## 2018-11-13 NOTE — Patient Instructions (Addendum)
Continue Omeprazole 40 mg by mouth daily.  We have given you the lab order to take to [place] have been scheduled for an endoscopy and colonoscopy. Please follow the written instructions given to you at your visit today. Please pick up your prep supplies at the pharmacy within the next 1-3 days. If you use inhalers (even only as needed), please bring them with you on the day of your procedure. Your physician has requested that you go to www.startemmi.com and enter the access code given to you at your visit today. This web site gives a general overview about your procedure. However, you should still follow specific instructions given to you by our office regarding your preparation for the procedure.

## 2018-11-14 LAB — CBC WITH DIFFERENTIAL/PLATELET
Basophils Absolute: 0.1 10*3/uL (ref 0.0–0.2)
Basos: 1 %
EOS (ABSOLUTE): 0.8 10*3/uL — ABNORMAL HIGH (ref 0.0–0.4)
Eos: 9 %
Hematocrit: 33.3 % — ABNORMAL LOW (ref 37.5–51.0)
Hemoglobin: 10.6 g/dL — ABNORMAL LOW (ref 13.0–17.7)
Immature Grans (Abs): 0 10*3/uL (ref 0.0–0.1)
Immature Granulocytes: 0 %
Lymphocytes Absolute: 2.5 10*3/uL (ref 0.7–3.1)
Lymphs: 28 %
MCH: 25.8 pg — ABNORMAL LOW (ref 26.6–33.0)
MCHC: 31.8 g/dL (ref 31.5–35.7)
MCV: 81 fL (ref 79–97)
MONOS ABS: 0.6 10*3/uL (ref 0.1–0.9)
Monocytes: 7 %
Neutrophils Absolute: 5.1 10*3/uL (ref 1.4–7.0)
Neutrophils: 55 %
Platelets: 378 10*3/uL (ref 150–450)
RBC: 4.11 x10E6/uL — ABNORMAL LOW (ref 4.14–5.80)
RDW: 17.7 % — ABNORMAL HIGH (ref 11.6–15.4)
WBC: 9.2 10*3/uL (ref 3.4–10.8)

## 2018-11-15 NOTE — Progress Notes (Signed)
Reviewed and agree with documentation and assessment and plan. K. Veena Levie Owensby , MD   

## 2018-11-18 ENCOUNTER — Inpatient Hospital Stay: Payer: Managed Care, Other (non HMO)

## 2018-11-18 VITALS — BP 143/71 | HR 65 | Temp 97.6°F | Resp 18

## 2018-11-18 DIAGNOSIS — D500 Iron deficiency anemia secondary to blood loss (chronic): Secondary | ICD-10-CM

## 2018-11-18 DIAGNOSIS — K921 Melena: Secondary | ICD-10-CM | POA: Diagnosis not present

## 2018-11-18 MED ORDER — SODIUM CHLORIDE 0.9 % IV SOLN
Freq: Once | INTRAVENOUS | Status: AC
  Administered 2018-11-18: 14:00:00 via INTRAVENOUS
  Filled 2018-11-18: qty 250

## 2018-11-18 MED ORDER — IRON SUCROSE 20 MG/ML IV SOLN
200.0000 mg | Freq: Once | INTRAVENOUS | Status: AC
  Administered 2018-11-18: 200 mg via INTRAVENOUS
  Filled 2018-11-18: qty 10

## 2018-11-22 ENCOUNTER — Encounter: Payer: Self-pay | Admitting: Gastroenterology

## 2018-11-22 ENCOUNTER — Ambulatory Visit (AMBULATORY_SURGERY_CENTER): Payer: Managed Care, Other (non HMO) | Admitting: Gastroenterology

## 2018-11-22 VITALS — BP 132/66 | HR 70 | Temp 96.0°F | Resp 15 | Ht 68.0 in | Wt 229.0 lb

## 2018-11-22 DIAGNOSIS — K2270 Barrett's esophagus without dysplasia: Secondary | ICD-10-CM

## 2018-11-22 DIAGNOSIS — K635 Polyp of colon: Secondary | ICD-10-CM

## 2018-11-22 DIAGNOSIS — Z86010 Personal history of colonic polyps: Secondary | ICD-10-CM

## 2018-11-22 DIAGNOSIS — D509 Iron deficiency anemia, unspecified: Secondary | ICD-10-CM

## 2018-11-22 DIAGNOSIS — K449 Diaphragmatic hernia without obstruction or gangrene: Secondary | ICD-10-CM

## 2018-11-22 DIAGNOSIS — D123 Benign neoplasm of transverse colon: Secondary | ICD-10-CM

## 2018-11-22 DIAGNOSIS — D125 Benign neoplasm of sigmoid colon: Secondary | ICD-10-CM

## 2018-11-22 MED ORDER — SODIUM CHLORIDE 0.9 % IV SOLN: 500.0000 mL | Freq: Once | INTRAVENOUS | Status: DC

## 2018-11-22 NOTE — Op Note (Addendum)
[facility] Patient Name: Collin Mills Procedure Date: 11/22/2018 2:40 PM MRN: [id number] Endoscopist: Mauri Pole , MD Age: 63 Referring MD:  Date of Birth: Aug 20, 1956 Gender: Male Account #: [id number] Procedure:                Colonoscopy Indications:              High risk colon cancer surveillance: Personal                            history of colonic polyps Medicines:                Monitored Anesthesia Care Procedure:                Pre-Anesthesia Assessment:                           - Prior to the procedure, a History and Physical                            was performed, and patient medications and                            allergies were reviewed. The patient's tolerance of                            previous anesthesia was also reviewed. The risks                            and benefits of the procedure and the sedation                            options and risks were discussed with the patient.                            All questions were answered, and informed consent                            was obtained. Prior Anticoagulants: The patient has                            taken no previous anticoagulant or antiplatelet                            agents. ASA Grade Assessment: III - A patient with                            severe systemic disease. After reviewing the risks                            and benefits, the patient was deemed in                            satisfactory condition to undergo the procedure.  After obtaining informed consent, the colonoscope                            was passed under direct vision. Throughout the                            procedure, the patient's blood pressure, pulse, and                            oxygen saturations were monitored continuously. The                            Colonoscope was introduced through the anus and                            advanced to the the cecum,  identified by                            appendiceal orifice and ileocecal valve. The                            colonoscopy was performed without difficulty. The                            patient tolerated the procedure well. The quality                            of the bowel preparation was excellent. The                            ileocecal valve, appendiceal orifice, and rectum                            were photographed. Scope In: 2:53:34 PM Scope Out: 3:09:36 PM Scope Withdrawal Time: 0 hours 13 minutes 28 seconds  Total Procedure Duration: 0 hours 16 minutes 2 seconds  Findings:                 The perianal and digital rectal examinations were                            normal.                           Four semi-pedunculated polyps were found in the                            transverse colon. The polyps were 7 to 12 mm in                            size. These polyps were removed with a hot snare.                            Resection and retrieval were complete.  A 3 mm polyp was found in the sigmoid colon. The                            polyp was sessile. The polyp was removed with a                            cold biopsy forceps. Resection and retrieval were                            complete.                           Non-bleeding internal hemorrhoids were found during                            retroflexion. The hemorrhoids were small. Complications:            No immediate complications. Estimated Blood Loss:     Estimated blood loss was minimal. Impression:               - Four 7 to 12 mm polyps in the transverse colon,                            removed with a hot snare. Resected and retrieved.                           - One 3 mm polyp in the sigmoid colon, removed with                            a cold biopsy forceps. Resected and retrieved.                           - Non-bleeding internal hemorrhoids. Recommendation:           - Patient  has a contact number available for                            emergencies. The signs and symptoms of potential                            delayed complications were discussed with the                            patient. Return to normal activities tomorrow.                            Written discharge instructions were provided to the                            patient.                           - Resume previous diet.                           -  Continue present medications.                           - Await pathology results.                           - Repeat colonoscopy in 3 years for surveillance                            based on pathology results.                           - Return to GI clinic at the next available                            appointment.                           - To visualize the small bowel, perform video                            capsule endoscopy at appointment to be scheduled.                           - Refer to a genetics counselor at appointment to                            be scheduled. Mauri Pole, MD 11/22/2018 3:29:57 PM This report has been signed electronically.

## 2018-11-22 NOTE — Op Note (Addendum)
[facility] Patient Name: Collin Mills Procedure Date: 11/22/2018 2:41 PM MRN: [id number] Endoscopist: Mauri Pole , MD Age: 63 Referring MD:  Date of Birth: 1956/02/04 Gender: Male Account #: [id number] Procedure:                Upper GI endoscopy Indications:              Suspected upper gastrointestinal bleeding in                            patient with unexplained iron deficiency anemia Medicines:                Monitored Anesthesia Care Procedure:                Pre-Anesthesia Assessment:                           - Prior to the procedure, a History and Physical                            was performed, and patient medications and                            allergies were reviewed. The patient's tolerance of                            previous anesthesia was also reviewed. The risks                            and benefits of the procedure and the sedation                            options and risks were discussed with the patient.                            All questions were answered, and informed consent                            was obtained. Prior Anticoagulants: The patient has                            taken no previous anticoagulant or antiplatelet                            agents. ASA Grade Assessment: III - A patient with                            severe systemic disease. After reviewing the risks                            and benefits, the patient was deemed in                            satisfactory condition to undergo the procedure.  After obtaining informed consent, the endoscope was                            passed under direct vision. Throughout the                            procedure, the patient's blood pressure, pulse, and                            oxygen saturations were monitored continuously. The                            Model GIF-HQ190 [id number]) scope was introduced   through the mouth, and advanced to the second part                            of duodenum. The upper GI endoscopy was                            accomplished without difficulty. The patient                            tolerated the procedure well. Scope In: Scope Out: Findings:                 The esophagus and gastroesophageal junction were                            examined with white light and narrow band imaging                            (NBI) from a forward view and retroflexed position.                            There were esophageal mucosal changes suggestive of                            long-segment Barrett's esophagus. These changes                            involved the mucosa at the upper extent of the                            gastric folds (38 cm from the incisors) extending                            to the Z-line. Four tongues of salmon-colored                            mucosa were present from 32 to 38 cm. The maximum                            longitudinal extent of these esophageal mucosal  changes was 6 cm in length. Mucosa was biopsied                            with a cold forceps for histology in a targeted                            manner at intervals of 1 cm in the lower third of                            the esophagus. One specimen bottle was sent to                            pathology.                           A small hiatal hernia was present.                           The stomach was normal.                           The cardia and gastric fundus were normal on                            retroflexion.                           The examined duodenum was normal. Complications:            No immediate complications. Estimated Blood Loss:     Estimated blood loss was minimal. Impression:               - Esophageal mucosal changes suggestive of                            long-segment Barrett's esophagus. Biopsied.                            - Small hiatal hernia.                           - Normal stomach.                           - Normal stomach.                           - Normal examined duodenum. Recommendation:           - Patient has a contact number available for                            emergencies. The signs and symptoms of potential                            delayed complications were discussed with the  patient. Return to normal activities tomorrow.                            Written discharge instructions were provided to the                            patient.                           - Resume previous diet.                           - Continue present medications.                           - Await pathology results.                           - Repeat upper endoscopy in 3 years for                            surveillance based on pathology results.                           - Follow an antireflux regimen indefinitely.                           - Use Prilosec (omeprazole) 40 mg PO daily. Mauri Pole, MD 11/22/2018 3:25:00 PM This report has been signed electronically.

## 2018-11-22 NOTE — Progress Notes (Signed)
Called to room to assist during endoscopic procedure.  Patient ID and intended procedure confirmed with present staff. Received instructions for my participation in the procedure from the performing physician.  

## 2018-11-22 NOTE — Patient Instructions (Signed)
Please read handouts provided. Continue present medications. Await pathology results.      YOU HAD AN ENDOSCOPIC PROCEDURE TODAY AT [facility]:   Refer to the procedure report that was given to you for any specific questions about what was found during the examination.  If the procedure report does not answer your questions, please call your gastroenterologist to clarify.  If you requested that your care partner not be given the details of your procedure findings, then the procedure report has been included in a sealed envelope for you to review at your convenience later.  YOU SHOULD EXPECT: Some feelings of bloating in the abdomen. Passage of more gas than usual.  Walking can help get rid of the air that was put into your GI tract during the procedure and reduce the bloating. If you had a lower endoscopy (such as a colonoscopy or flexible sigmoidoscopy) you may notice spotting of blood in your stool or on the toilet paper. If you underwent a bowel prep for your procedure, you may not have a normal bowel movement for a few days.  Please Note:  You might notice some irritation and congestion in your nose or some drainage.  This is from the oxygen used during your procedure.  There is no need for concern and it should clear up in a day or so.  SYMPTOMS TO REPORT IMMEDIATELY:   Following lower endoscopy (colonoscopy or flexible sigmoidoscopy):  Excessive amounts of blood in the stool  Significant tenderness or worsening of abdominal pains  Swelling of the abdomen that is new, acute  Fever of 100F or higher   Following upper endoscopy (EGD)  Vomiting of blood or coffee ground material  New chest pain or pain under the shoulder blades  Painful or persistently difficult swallowing  New shortness of breath  Fever of 100F or higher  Black, tarry-looking stools  For urgent or emergent issues, a gastroenterologist can be reached at any hour by calling [phone]  [phone].   DIET:  We do recommend a small meal at first, but then you may proceed to your regular diet.  Drink plenty of fluids but you should avoid alcoholic beverages for 24 hours.  ACTIVITY:  You should plan to take it easy for the rest of today and you should NOT DRIVE or use heavy machinery until tomorrow (because of the sedation medicines used during the test).    FOLLOW UP: Our staff will call the number listed on your records the next business day following your procedure to check on you and address any questions or concerns that you may have regarding the information given to you following your procedure. If we do not reach you, we will leave a message.  However, if you are feeling well and you are not experiencing any problems, there is no need to return our call.  We will assume that you have returned to your regular daily activities without incident.  If any biopsies were taken you will be contacted by phone or by letter within the next 1-3 weeks.  Please call us at [phone] if you have not heard about the biopsies in 3 weeks.    SIGNATURES/CONFIDENTIALITY: You and/or your care partner have signed paperwork which will be entered into your electronic medical record.  These signatures attest to the fact that that the information above on your After Visit Summary has been reviewed and is understood.  Full responsibility of the confidentiality of this discharge information lies with you  and/or your care-partner.

## 2018-11-22 NOTE — Progress Notes (Signed)
Report given to PACU, vss 

## 2018-11-25 ENCOUNTER — Telehealth: Payer: Self-pay

## 2018-11-25 ENCOUNTER — Inpatient Hospital Stay: Payer: Managed Care, Other (non HMO) | Attending: Oncology

## 2018-11-25 VITALS — BP 120/75 | HR 77 | Resp 18

## 2018-11-25 DIAGNOSIS — D500 Iron deficiency anemia secondary to blood loss (chronic): Secondary | ICD-10-CM | POA: Insufficient documentation

## 2018-11-25 DIAGNOSIS — K921 Melena: Secondary | ICD-10-CM | POA: Diagnosis not present

## 2018-11-25 MED ORDER — SODIUM CHLORIDE 0.9 % IV SOLN
Freq: Once | INTRAVENOUS | Status: AC
  Administered 2018-11-25: 14:00:00 via INTRAVENOUS
  Filled 2018-11-25: qty 250

## 2018-11-25 MED ORDER — IRON SUCROSE 20 MG/ML IV SOLN
200.0000 mg | Freq: Once | INTRAVENOUS | Status: AC
  Administered 2018-11-25: 200 mg via INTRAVENOUS
  Filled 2018-11-25: qty 10

## 2018-11-25 NOTE — Telephone Encounter (Signed)
  Follow up Call-  Call back number 11/22/2018 06/12/2016  Post procedure Call Back phone  # [phone]  Permission to leave phone message Yes Yes  Some recent data might be hidden     Patient questions:  Do you have a fever, pain , or abdominal swelling? No. Pain Score  0 *  Have you tolerated food without any problems? Yes.    Have you been able to return to your normal activities? Yes.    Do you have any questions about your discharge instructions: Diet   No. Medications  No. Follow up visit  No.  Do you have questions or concerns about your Care? No.  Actions: * If pain score is 4 or above: No action needed, pain <4.

## 2018-12-02 ENCOUNTER — Inpatient Hospital Stay: Payer: Managed Care, Other (non HMO)

## 2018-12-02 ENCOUNTER — Encounter: Payer: Self-pay | Admitting: Gastroenterology

## 2018-12-02 VITALS — BP 135/72 | HR 81 | Temp 97.5°F | Resp 18

## 2018-12-02 DIAGNOSIS — D500 Iron deficiency anemia secondary to blood loss (chronic): Secondary | ICD-10-CM

## 2018-12-02 MED ORDER — SODIUM CHLORIDE 0.9 % IV SOLN
Freq: Once | INTRAVENOUS | Status: AC
  Administered 2018-12-02: 14:00:00 via INTRAVENOUS
  Filled 2018-12-02: qty 250

## 2018-12-02 MED ORDER — IRON SUCROSE 20 MG/ML IV SOLN
200.0000 mg | Freq: Once | INTRAVENOUS | Status: AC
  Administered 2018-12-02: 200 mg via INTRAVENOUS
  Filled 2018-12-02: qty 10

## 2018-12-05 ENCOUNTER — Telehealth: Payer: Self-pay

## 2018-12-05 NOTE — Telephone Encounter (Signed)
Called pt and left a detailed VM advised pt to called back to schedule for 1 month weight loss f/u.

## 2018-12-05 NOTE — Telephone Encounter (Signed)
Noted..  Patient needs to follow-up sometime in the next month for his weight.  This could be with me or Lauren as she started him on this medication.  Thanks.

## 2018-12-05 NOTE — Telephone Encounter (Signed)
Called and spoke with patient. Pt stated that the medication actually was approved and he has already started to taking this medication. Pt stated that the first PA was denied but the second one got approved.  FYI

## 2018-12-05 NOTE — Telephone Encounter (Signed)
Contrave 8-90MG er tablets has been rejected by insurance.  Non-preferred agents typically have a higher patient co-pay than health insurance plan preferred agents.  Sent to PCP to advise

## 2018-12-05 NOTE — Telephone Encounter (Signed)
He will need to return to the office to discuss possible medication for weight loss if he would like to pursue other alternatives.  Thanks.

## 2018-12-09 ENCOUNTER — Inpatient Hospital Stay: Payer: Managed Care, Other (non HMO)

## 2018-12-09 ENCOUNTER — Other Ambulatory Visit: Payer: Self-pay

## 2018-12-09 VITALS — BP 131/78 | HR 77 | Temp 98.6°F | Resp 18

## 2018-12-09 DIAGNOSIS — D500 Iron deficiency anemia secondary to blood loss (chronic): Secondary | ICD-10-CM

## 2018-12-09 DIAGNOSIS — D509 Iron deficiency anemia, unspecified: Secondary | ICD-10-CM

## 2018-12-09 MED ORDER — SODIUM CHLORIDE 0.9 % IV SOLN
Freq: Once | INTRAVENOUS | Status: AC
  Administered 2018-12-09: 14:00:00 via INTRAVENOUS
  Filled 2018-12-09: qty 250

## 2018-12-09 MED ORDER — IRON SUCROSE 20 MG/ML IV SOLN
200.0000 mg | Freq: Once | INTRAVENOUS | Status: AC
  Administered 2018-12-09: 200 mg via INTRAVENOUS
  Filled 2018-12-09: qty 10

## 2018-12-11 ENCOUNTER — Other Ambulatory Visit: Payer: Self-pay

## 2018-12-11 DIAGNOSIS — D509 Iron deficiency anemia, unspecified: Secondary | ICD-10-CM

## 2018-12-19 ENCOUNTER — Encounter: Payer: Self-pay | Admitting: Gastroenterology

## 2018-12-19 ENCOUNTER — Ambulatory Visit (INDEPENDENT_AMBULATORY_CARE_PROVIDER_SITE_OTHER): Payer: Managed Care, Other (non HMO) | Admitting: Gastroenterology

## 2018-12-19 DIAGNOSIS — D509 Iron deficiency anemia, unspecified: Secondary | ICD-10-CM

## 2018-12-19 NOTE — Progress Notes (Signed)
Pt here for capsule endo, pt completed prep without difficulty. Pt swallowed capsule and did not have any problems. Pt instructed on how to retrieve capsule and return it.  Lot # [id number] Exp:  01/12/2020

## 2018-12-27 ENCOUNTER — Encounter: Payer: Self-pay | Admitting: Oncology

## 2018-12-31 ENCOUNTER — Ambulatory Visit: Payer: Managed Care, Other (non HMO) | Admitting: Family Medicine

## 2018-12-31 ENCOUNTER — Encounter: Payer: Self-pay | Admitting: Family Medicine

## 2018-12-31 VITALS — BP 142/82 | HR 78 | Temp 98.2°F | Resp 18 | Ht 68.0 in | Wt 229.4 lb

## 2018-12-31 DIAGNOSIS — D509 Iron deficiency anemia, unspecified: Secondary | ICD-10-CM | POA: Diagnosis not present

## 2018-12-31 DIAGNOSIS — Z713 Dietary counseling and surveillance: Secondary | ICD-10-CM

## 2018-12-31 DIAGNOSIS — I10 Essential (primary) hypertension: Secondary | ICD-10-CM | POA: Diagnosis not present

## 2018-12-31 NOTE — Patient Instructions (Signed)
Keep up good work! Continue contrave, exercise and healthy diet  This is  Dr. Lupita Dawn  example of a  "Low GI"  Diet:  It will allow you to lose 4 to 8  lbs  per month if you follow it carefully.  Your goal with exercise is a minimum of 30 minutes of aerobic exercise 5 days per week (Walking does not count once it becomes easy!)     All of the foods can be found at grocery stores and in bulk at [employer].  The Atkins protein bars and shakes are available in more varieties at [employer], [employer] and [place].       7 AM Breakfast:  Choose from the following:             Low carbohydrate Protein  Shakes (I recommend the  Premier Protein chocolate shakes,  EAS AdvantEdge "Carb Control" shakes  Or the Atkins shakes all are under 3 net carbs)                           a scrambled egg/bacon/cheese burrito made with [employer]'s "carb balance" whole wheat tortilla  (about 10 net carbs )             Regulatory affairs officer (basically a quiche without the pastry crust) that is eaten cold and very convenient way to get your eggs.  8 carbs)             If you make your own protein shakes, avoid bananas and pineapple,  And use low carb greek yogurt or original /unsweetened almond or soy milk     Avoid cereal and bananas, oatmeal and cream of wheat and grits. They are loaded with carbohydrates!     10 AM: high protein snack:             Protein bar by Atkins (the snack size, under 200 cal, usually < 6 net carbs).               A stick of cheese:  Around 1 carb,  100 cal                Dannon Light n Fit [place] Yogurt  (80 cal, 8 carbs)  Other so called "protein bars" and Greek yogurts tend to be loaded with carbohydrates.  Remember, in food advertising, the word "energy" is synonymous for " carbohydrate."   Lunch:              A Sandwich using the bread choices listed, Can use any  Eggs,  lunchmeat, grilled meat or canned tuna), avocado, regular mayo/mustard  and cheese.             A Salad  using blue cheese, ranch,  Goddess or vinagrette,  Avoid taco shells, croutons or "confetti" and no "candied nuts" but regular nuts OK.              No pretzels, nabs  or chips.  Pickles and miniature sweet peppers are a good low carb alternative that provide a "crunch"  The bread is the only source of carbohydrate in a sandwich and  can be decreased by trying some of the attached alternatives to traditional loaf bread              Avoid "Low fat dressings, as well as Barry Brunner and [employer] dressings They are loaded with sugar!     3 PM/ Mid  day  Snack:             Consider  1 ounce of  almonds, walnuts, pistachios, pecans, peanuts,  Macadamia nuts or a nut medley.  Avoid "granola and granola bars "  Mixed nuts are ok in moderation as long as there are no raisins,  cranberries or dried fruit.   KIND bars are OK if you get the low glycemic index variety   Try the prosciutto/mozzarella cheese sticks by [employer]  In deli /backery section   High protein                 6 PM  Dinner:                Meat/fowl/fish with a green salad, and either broccoli, cauliflower, green beans, spinach, brussel sprouts or  Lima beans. DO NOT BREAD THE PROTEIN!!               There is a low carb pasta by Dreamfield's that is acceptable and tastes great: only 5 digestible carbs/serving.( All grocery stores but [employer]Js carry it ) Several ready made meals are available low carb:              Try Michel Angelo's chicken piccata or chicken or eggplant parm over low carb pasta.(Lowes and BJs)              Marjory Lies Sanchez's "Carnitas" (pulled pork, no sauce,  0 carbs) or his beef pot roast to make a dinner burrito (at [employer])             Pesto over low carb pasta (bj's sells a good quality pesto in the center refrigerated section of the deli              Try satueeing  Cheral Marker with mushroooms as a good side              [employer] makes a mashed cauliflower that tastes like mashed potatoes   Whole wheat pasta is still  full of digestible carbs and  Not as low in glycemic index as Dreamfield's.   Brown rice is still rice,  So skip the rice and noodles if you eat [place] or [place] (or at least limit to 1/2 cup)   9 PM snack :              Breyer's "low carb" fudgsicle or  ice cream bar (Carb Smart line), or  Weight Watcher's ice cream bar , or another "no sugar added" ice cream;             a serving of fresh berries/cherries with whipped cream              Cheese or DANNON'S LlGHT N FIT GREEK YOGURT             8 ounces of [place] unsweetened almond/cococunut milk               Treat yourself to a parfait made with whipped cream blueberiies, walnuts and vanilla greek yogurt   Avoid bananas, pineapple, grapes  and watermelon on a regular basis because they are high in sugar.  THINK OF THEM AS DESSERT   Remember that snack Substitutions should be less than 10 NET carbs per serving and meals < 20 carbs. Remember to subtract fiber grams to get the "net carbs."

## 2018-12-31 NOTE — Progress Notes (Signed)
Subjective:    Patient ID: Collin Mills, male    DOB: August 14, 1956, 63 y.o.   MRN: [id number]  HPI   Patient presents to clinic to follow-up on weight loss after beginning Contrave about 1 month ago and also to follow-up on weakness after discovering iron deficiency and beginning iron infusions.  Patient is been tolerating Contrave without any adverse side effects.  We originally want to start patient on Saxenda, but insurance would not approve this medication. He has also been working on [employer] choices and controlling portions.  He has begun going to the gym, started going last week and has been walking on the treadmill and lifting small weights.  Weight at home on 12/02/2018 was 227 pounds.  Weight at home this morning was 223.8 pounds.  Patient's weakness has improved since doing the iron infusions.  He has appointment with GI coming up to assess for possible GI bleed that could have led to his iron deficiency.  BP remaining stable on current medications.   Patient Active Problem List   Diagnosis Date Noted  . Hx of adenomatous colonic polyps 11/13/2018  . Iron deficiency anemia 11/11/2018  . Prediabetes 07/15/2018  . Encounter for general adult medical examination with abnormal findings 01/21/2018  . Rotator cuff impingement syndrome of left shoulder 01/21/2018  . Cerebral aneurysm 06/13/2017  . Dizziness 05/23/2017  . Change in vision 05/23/2017  . Obesity 08/02/2016  . Abdominal fullness 04/03/2016  . Osteoarthritis 12/28/2015  . Allergic rhinitis 12/28/2015  . Essential hypertension 12/28/2015  . Hyperlipidemia 12/28/2015   Social History   Tobacco Use  . Smoking status: Former Smoker    Packs/day: 2.00    Years: 36.00    Pack years: 72.00    Last attempt to quit: 2004    Years since quitting: 16.2  . Smokeless tobacco: Never Used  Substance Use Topics  . Alcohol use: Yes    Alcohol/week: 5.0 standard drinks    Types: 5 Shots of liquor per week      Comment: occasional   Review of Systems  Constitutional: Negative for chills, fatigue and fever.  HENT: Negative for congestion, ear pain, sinus pain and sore throat.   Eyes: Negative.   Respiratory: Negative for cough, shortness of breath and wheezing.   Cardiovascular: Negative for chest pain, palpitations and leg swelling.  Gastrointestinal: Negative for abdominal pain, diarrhea, nausea and vomiting.  Genitourinary: Negative for dysuria, frequency and urgency.  Musculoskeletal: Negative for arthralgias and myalgias.  Skin: Negative for color change, pallor and rash.  Neurological: Negative for syncope, light-headedness and headaches.  Psychiatric/Behavioral: The patient is not nervous/anxious.       Objective:   Physical Exam   Constitutional: He appears well-developed and well-nourished. No distress.  HENT:  Head: Normocephalic and atraumatic.  Eyes: Pupils are equal, round, and reactive to light. Conjunctivae and EOM are normal. No scleral icterus.  Neck: Normal range of motion. Neck supple. No tracheal deviation present.  Cardiovascular: Normal rate, regular rhythm and normal heart sounds.  Pulmonary/Chest: Effort normal and breath sounds normal. No respiratory distress. He has no wheezes. He has no rales.  Abdominal: Soft. Bowel sounds are normal. There is no tenderness.  Neurological: He is alert and oriented to person, place, and time. Gait normal  Skin: Skin is warm and dry. He is not diaphoretic. No pallor.  Psychiatric: He has a normal mood and affect. His behavior is normal. Thought content normal.   Nursing note and vitals  reviewed.  Wt Readings from Last 3 Encounters:  11/22/18 229 lb (103.9 kg)  11/13/18 229 lb 4 oz (104 kg)  11/11/18 231 lb 1.6 oz (104.8 kg)    Today's Vitals   12/31/18 0832  BP: (!) 142/82  Pulse: 78  Resp: 18  Temp: 98.2 F (36.8 C)  TempSrc: Oral  SpO2: 94%  Weight: 229 lb 6.4 oz (104.1 kg)  Height: 5' 8"  (1.727 m)   Body  mass index is 34.88 kg/m.     Assessment & Plan:   Weight loss counseling-patient will continue Contrave.  Patient given handout outlining diet to follow that outlines a lower carb, low sugar, high protein diet that will help promote weight loss in addition to you taking the contrave.  Recommended patient slowly up his physical activity amount as tolerated.  Patient's weight on our scale has remained about the same, but patient encouraged to continue keeping track on his scale at home and keep a log of his weights.  Iron deficiency anemia-patient continues to follow with hematology.  GI appointment upcoming to do possible colonoscopy to look for source of possible bleeding in the GI tract.  Essential hypertension- blood pressure remains stable on current medications.  Patient will follow-up here in 3 months for recheck on weight loss while taking Contrave.  Advised to return to clinic sooner if anytime if issues arise.

## 2019-01-03 ENCOUNTER — Inpatient Hospital Stay: Payer: Managed Care, Other (non HMO)

## 2019-01-06 ENCOUNTER — Inpatient Hospital Stay: Payer: Managed Care, Other (non HMO)

## 2019-01-06 ENCOUNTER — Ambulatory Visit: Payer: Managed Care, Other (non HMO)

## 2019-01-06 ENCOUNTER — Inpatient Hospital Stay: Payer: Managed Care, Other (non HMO) | Admitting: Oncology

## 2019-01-06 ENCOUNTER — Ambulatory Visit: Payer: Managed Care, Other (non HMO) | Admitting: Oncology

## 2019-01-07 ENCOUNTER — Encounter: Payer: Self-pay | Admitting: Oncology

## 2019-01-08 ENCOUNTER — Encounter: Payer: Self-pay | Admitting: Gastroenterology

## 2019-01-11 ENCOUNTER — Other Ambulatory Visit: Payer: Self-pay | Admitting: Family Medicine

## 2019-01-11 DIAGNOSIS — I10 Essential (primary) hypertension: Secondary | ICD-10-CM

## 2019-01-13 ENCOUNTER — Other Ambulatory Visit: Payer: Self-pay | Admitting: Family Medicine

## 2019-01-13 DIAGNOSIS — I10 Essential (primary) hypertension: Secondary | ICD-10-CM

## 2019-01-20 ENCOUNTER — Ambulatory Visit: Payer: Managed Care, Other (non HMO) | Admitting: Oncology

## 2019-01-20 ENCOUNTER — Ambulatory Visit: Payer: Managed Care, Other (non HMO)

## 2019-01-27 ENCOUNTER — Telehealth: Payer: Self-pay | Admitting: *Deleted

## 2019-01-27 ENCOUNTER — Other Ambulatory Visit: Payer: Self-pay

## 2019-01-28 ENCOUNTER — Inpatient Hospital Stay: Payer: Managed Care, Other (non HMO) | Attending: Oncology | Admitting: Oncology

## 2019-01-28 ENCOUNTER — Inpatient Hospital Stay: Payer: Managed Care, Other (non HMO)

## 2019-01-28 ENCOUNTER — Encounter: Payer: Self-pay | Admitting: Oncology

## 2019-01-28 ENCOUNTER — Other Ambulatory Visit: Payer: Self-pay

## 2019-01-28 DIAGNOSIS — Z87891 Personal history of nicotine dependence: Secondary | ICD-10-CM | POA: Diagnosis not present

## 2019-01-28 DIAGNOSIS — I10 Essential (primary) hypertension: Secondary | ICD-10-CM

## 2019-01-28 DIAGNOSIS — D500 Iron deficiency anemia secondary to blood loss (chronic): Secondary | ICD-10-CM

## 2019-01-28 DIAGNOSIS — K921 Melena: Secondary | ICD-10-CM | POA: Diagnosis not present

## 2019-01-28 DIAGNOSIS — Z7982 Long term (current) use of aspirin: Secondary | ICD-10-CM

## 2019-01-28 DIAGNOSIS — Z79899 Other long term (current) drug therapy: Secondary | ICD-10-CM

## 2019-01-28 NOTE — Progress Notes (Signed)
Called patient for WebEx visit with Dr Tasia Catchings.  Patient states no new concerns today.

## 2019-01-28 NOTE — Progress Notes (Signed)
HEMATOLOGY-ONCOLOGY TeleHEALTH VISIT PROGRESS NOTE  I connected with Collin Mills on 01/28/19 at  9:00 AM EDT by telephone and verified that I am speaking with the correct person using two identifiers.  I discussed the limitations, risks, security and privacy concerns of performing an evaluation and management service by telemedicine and the availability of in-person appointments. I also discussed with the patient that there may be a patient responsible charge related to this service. The patient expressed understanding and agreed to proceed.   Other persons participating in the visit and their role in the encounter: Geraldine Solar, [place]  Patient's location: Home  Provider's location: Work   Chief Complaint: Follow-up for iron deficiency anemia, blood in stool, management.  INTERVAL HISTORY Collin Mills is a 63 y.o. male who has above history reviewed by me today presents for Webex visit for management of iron deficiency anemia, and blood in stool Problems and complaints are listed below:  Patient was last seen by me on 11/11/2018. Patient received 4 IV Venofer treatment since then. He also had [facility] labs done on 01/02/2019.  And I reviewed these labs.  Patient reports feeling much better since IV iron infusion.  Fatigue has resolved. He continues to see intermittent blood in the stool. Patient follows up with gastroenterology Dr. Harl Bowie and had upper endoscopy and colonoscopy on 11/22/2018. Upper endoscopy showed esophageal mucosal changes suggestive long segment Barrett esophagus.  Biopsied.  Pathology showed negative for dysplasia. Colonoscopy showed multiple polyps which were resected and retrieved.  Pathology showed tubular adenoma and hyperplastic polyps.  Negative for high-grade dysplasia or malignancy. 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  Capsule study results were not available to me in EMR. Reports taking iron 325 mg twice  daily.  Tolerating well.  No GI side effects. Review of Systems  Constitutional: Positive for fatigue. Negative for appetite change, chills, fever and unexpected weight change.  HENT:   Negative for hearing loss and voice change.   Eyes: Negative for eye problems and icterus.  Respiratory: Negative for chest tightness, cough and shortness of breath.   Cardiovascular: Negative for chest pain and leg swelling.  Gastrointestinal: Positive for blood in stool. Negative for abdominal distention and abdominal pain.  Endocrine: Negative for hot flashes.  Genitourinary: Negative for difficulty urinating, dysuria and frequency.   Musculoskeletal: Negative for arthralgias.  Skin: Negative for itching and rash.  Neurological: Negative for light-headedness and numbness.  Hematological: Negative for adenopathy. Does not bruise/bleed easily.  Psychiatric/Behavioral: Negative for confusion.    Past Medical History:  Diagnosis Date  . Allergic rhinitis   . Allergy   . Anemia    giving blood every six weeks  . Barrett's esophagus   . Chickenpox   . Colon polyps   . GERD (gastroesophageal reflux disease)   . Hyperlipidemia   . Hypertension   . Iron deficiency anemia 11/11/2018  . Migraines   . Osteoarthritis    osteoarthritis   Past Surgical History:  Procedure Laterality Date  . COLONOSCOPY    . ESOPHAGOGASTRODUODENOSCOPY ENDOSCOPY      Family History  Problem Relation Age of Onset  . Lung cancer Mother   . COPD Mother   . Lung cancer Father   . Stroke Father   . Spina bifida Sister   . Cirrhosis Brother   . Colon cancer Neg Hx   . Esophageal cancer Neg Hx   . Rectal cancer Neg Hx   .  Stomach cancer Neg Hx     Social History   Socioeconomic History  . Marital status: Married    Spouse name: Not on file  . Number of children: Not on file  . Years of education: Not on file  . Highest education level: Not on file  Occupational History  . Not on file  Social Needs  . Financial  resource strain: Not on file  . Food insecurity:    Worry: Not on file    Inability: Not on file  . Transportation needs:    Medical: Not on file    Non-medical: Not on file  Tobacco Use  . Smoking status: Former Smoker    Packs/day: 2.00    Years: 36.00    Pack years: 72.00    Last attempt to quit: 2004    Years since quitting: 16.2  . Smokeless tobacco: Never Used  Substance and Sexual Activity  . Alcohol use: Yes    Alcohol/week: 5.0 standard drinks    Types: 5 Shots of liquor per week    Comment: occasional  . Drug use: No  . Sexual activity: Not on file  Lifestyle  . Physical activity:    Days per week: Not on file    Minutes per session: Not on file  . Stress: Not on file  Relationships  . Social connections:    Talks on phone: Not on file    Gets together: Not on file    Attends religious service: Not on file    Active member of club or organization: Not on file    Attends meetings of clubs or organizations: Not on file    Relationship status: Not on file  . Intimate partner violence:    Fear of current or ex partner: Not on file    Emotionally abused: Not on file    Physically abused: Not on file    Forced sexual activity: Not on file  Other Topics Concern  . Not on file  Social History Narrative  . Not on file    Current Outpatient Medications on File Prior to Visit  Medication Sig Dispense Refill  . amLODipine (NORVASC) 5 MG tablet TAKE 1 TABLET(5 MG) BY MOUTH DAILY 90 tablet 1  . aspirin 81 MG tablet Take 81 mg by mouth daily.    . ferrous sulfate 325 (65 FE) MG EC tablet Take 1 tablet (325 mg total) by mouth 2 (two) times daily. 60 tablet 3  . fluticasone (FLONASE) 50 MCG/ACT nasal spray Place into both nostrils daily.    . hydrochlorothiazide (HYDRODIURIL) 25 MG tablet Take 1 tablet (25 mg total) by mouth daily. 90 tablet 1  . iron dextran complex in sodium chloride 0.9 % 500 mL Inject into the vein once.    Marland Kitchen losartan (COZAAR) 100 MG tablet TAKE 1  TABLET(100 MG) BY MOUTH DAILY 90 tablet 1  . meclizine (ANTIVERT) 25 MG tablet Take 1 tablet (25 mg total) by mouth 3 (three) times daily as needed for dizziness. 30 tablet 0  . Multiple Vitamin (MULTIVITAMIN) tablet Take 1 tablet by mouth daily.    . Naltrexone-buPROPion HCl ER (CONTRAVE) 8-90 MG TB12 Start 1 tab qAM x1 week, then 1 tab BID x1 week, then 2 tabs qAM and 1 tab qPM for 1 week, then 2 tabs BID 120 tablet 2  . NON FORMULARY [place] [facility]-tec - 1 tab daily    . omeprazole (PRILOSEC) 40 MG capsule Take 1 capsule (40 mg total) by mouth  daily. 90 capsule 3  . pravastatin (PRAVACHOL) 40 MG tablet Take 1 tablet (40 mg total) by mouth daily. 90 tablet 2   No current facility-administered medications on file prior to visit.     Allergies  Allergen Reactions  . Molds & Smuts     Itching and sneezing        Observations/Objective: There were no vitals filed for this visit. There is no height or weight on file to calculate BMI.   Physical Exam  Constitutional: He is oriented to person, place, and time and well-developed, well-nourished, and in no distress. No distress.  HENT:  Head: Normocephalic and atraumatic.  Eyes: Pupils are equal, round, and reactive to light.  Neck: Normal range of motion.  Neurological: He is alert and oriented to person, place, and time.  Psychiatric: Affect normal.   CBC    Component Value Date/Time   WBC 9.2 11/13/2018 1136   WBC 7.3 01/18/2018 0758   RBC 4.11 (L) 11/13/2018 1136   RBC 4.77 01/18/2018 0758   HGB 10.6 (L) 11/13/2018 1136   HCT 33.3 (L) 11/13/2018 1136   PLT 378 11/13/2018 1136   MCV 81 11/13/2018 1136   MCV 82 12/05/2011 0820   MCH 25.8 (L) 11/13/2018 1136   MCH 28.0 05/22/2017 0846   MCHC 31.8 11/13/2018 1136   MCHC 33.6 01/18/2018 0758   RDW 17.7 (H) 11/13/2018 1136   RDW 16.1 (H) 12/05/2011 0820   LYMPHSABS 2.5 11/13/2018 1136   LYMPHSABS 2.9 12/05/2011 0820   MONOABS 0.5 05/22/2017 0846   MONOABS 0.6 12/05/2011 0820    EOSABS 0.8 (H) 11/13/2018 1136   EOSABS 1.2 (H) 12/05/2011 0820   BASOSABS 0.1 11/13/2018 1136   BASOSABS 0.1 12/05/2011 0820    CMP     Component Value Date/Time   NA 140 10/29/2018 0740   NA 142 12/05/2011 0820   K 3.8 10/29/2018 0740   K 4.0 12/05/2011 0820   CL 99 10/29/2018 0740   CL 107 12/05/2011 0820   CO2 24 10/29/2018 0740   CO2 28 12/05/2011 0820   GLUCOSE 127 (H) 10/29/2018 0740   GLUCOSE 120 (H) 01/18/2018 0758   GLUCOSE 81 12/05/2011 0820   BUN 10 10/29/2018 0740   BUN 10 12/05/2011 0820   CREATININE 1.14 10/29/2018 0740   CREATININE 0.92 12/05/2011 0820   CALCIUM 9.8 10/29/2018 0740   CALCIUM 9.5 12/05/2011 0820   PROT 6.9 01/18/2018 0758   PROT 6.6 08/13/2017 0712   PROT 7.7 12/05/2011 0820   ALBUMIN 4.2 01/18/2018 0758   ALBUMIN 4.3 08/13/2017 0712   ALBUMIN 4.4 12/05/2011 0820   AST 23 01/18/2018 0758   AST 30 12/05/2011 0820   ALT 33 01/18/2018 0758   ALT 40 12/05/2011 0820   ALKPHOS 53 01/18/2018 0758   ALKPHOS 57 12/05/2011 0820   BILITOT 0.5 01/18/2018 0758   BILITOT 0.3 08/13/2017 0712   BILITOT 0.6 12/05/2011 0820   GFRNONAA 69 10/29/2018 0740   GFRNONAA >60 12/05/2011 0820   GFRAA 80 10/29/2018 0740   GFRAA >60 12/05/2011 0820     Assessment and Plan: 1. Iron deficiency anemia due to chronic blood loss   2. Blood in the stool     01/02/2019 labs are discussed with patient. Both hemoglobin and iron panel have significantly improved since he had IV iron infusion. I do not think he needs any additional IV iron at this point. Given that he still has intermittent blood in stool, ongoing GI work-up,  possible small bowel AVM pending management, Recommend patient to continue take oral iron supplementation ferrous sulfate 325 mg twice daily as maintenance. Encourage patient to call gastroenterology and check what is his management plan.  Capsule study reports were not available to me at this point in EMR.  Follow Up Instructions: Recommend  patient to follow-up with labs in 10 weeks-[facility], MD assessment.  I discussed the assessment and treatment plan with the patient. The patient was provided an opportunity to ask questions and all were answered. The patient agreed with the plan and demonstrated an understanding of the instructions.  The patient was advised to call back or seek an in-person evaluation if the symptoms worsen or if the condition fails to improve as anticipated.   I provided 15 minutes of face-to-face video visit time during this encounter, and > 50% was spent counseling as documented under my assessment & plan.  Earlie Server, MD 01/28/2019 12:47 PM

## 2019-02-07 ENCOUNTER — Ambulatory Visit: Payer: Managed Care, Other (non HMO) | Admitting: Oncology

## 2019-02-07 ENCOUNTER — Ambulatory Visit: Payer: Managed Care, Other (non HMO)

## 2019-02-11 ENCOUNTER — Ambulatory Visit (INDEPENDENT_AMBULATORY_CARE_PROVIDER_SITE_OTHER): Payer: Managed Care, Other (non HMO) | Admitting: Gastroenterology

## 2019-02-11 ENCOUNTER — Encounter: Payer: Self-pay | Admitting: Gastroenterology

## 2019-02-11 ENCOUNTER — Other Ambulatory Visit: Payer: Self-pay

## 2019-02-11 VITALS — BP 132/83 | HR 77 | Ht 68.0 in | Wt 220.0 lb

## 2019-02-11 DIAGNOSIS — K649 Unspecified hemorrhoids: Secondary | ICD-10-CM

## 2019-02-11 DIAGNOSIS — K5520 Angiodysplasia of colon without hemorrhage: Secondary | ICD-10-CM | POA: Diagnosis not present

## 2019-02-11 DIAGNOSIS — D500 Iron deficiency anemia secondary to blood loss (chronic): Secondary | ICD-10-CM | POA: Diagnosis not present

## 2019-02-11 DIAGNOSIS — K625 Hemorrhage of anus and rectum: Secondary | ICD-10-CM

## 2019-02-11 MED ORDER — HYDROCORTISONE ACETATE 25 MG RE SUPP: 25.0000 mg | Freq: Every day | RECTAL | 0 refills | Status: DC

## 2019-02-11 NOTE — Progress Notes (Signed)
Collin Mills    [id number]    04/02/1956  Primary Care Physician:Sonnenberg, Angela Adam, MD  Referring Physician: Leone Haven, MD [address] [place], [place] [address]  This service was provided via audio and video telemedicine (Doximity video call) due to [place] 19 pandemic.  Patient location: Home Provider location: Office Used 2 patient identifiers to confirm the correct person. Explained the limitations in evaluation and management via telemedicine. Patient is aware of potential medical charges for this visit.  Patient consented to this virtual visit.  The persons participating in this telemedicine service were myself and the patient    Chief complaint: Iron deficiency anemia  HPI:  63 year old male with history of iron deficiency anemia In January 2020 diagnosed with new iron deficiency anemia with hemoglobin 9.6, hematocrit 29.7. TIBC 381, iron 21, iron saturation 6%, and ferritin 16   He has received IV iron therapy X4 in January 2020.  He is currently taking oral iron 325 mg twice daily, tolerating it well  Most recent hemoglobin 13.3, hematocrit 41.2, ferritin 331 and iron saturation 31% based on labs from January 02, 2019  Continues to have intermittent bright red blood per rectum in the toilet bowl and also when he wipes almost 50% of the time after a bowel movement.  He does feel protrusion of hemorrhoids and thinks the bleeding is coming from hemorrhoids. His stool is dark due to oral iron  Denies any nausea, vomiting, abdominal pain, dizziness, shortness of breath.   EGD November 22, 2018 showed long segment Barrett's esophagus, hiatal hernia otherwise unremarkable exam.  Biopsy showed Barrett's esophagus with no dysplasia. Colonoscopy November 22, 2018 with removal of 4 tubular adenomas and 1 hyperplastic polyp.  Internal hemorrhoids Capsule endoscopy December 19, 2018 showed few small nonbleeding AVM and small intestine and  one small erosion.  No active bleeding.  Outpatient Encounter Medications as of 02/11/2019  Medication Sig  . amLODipine (NORVASC) 5 MG tablet TAKE 1 TABLET(5 MG) BY MOUTH DAILY  . aspirin 81 MG tablet Take 81 mg by mouth daily.  . ferrous sulfate 325 (65 FE) MG EC tablet Take 1 tablet (325 mg total) by mouth 2 (two) times daily.  . fluticasone (FLONASE) 50 MCG/ACT nasal spray Place into both nostrils daily.  . hydrochlorothiazide (HYDRODIURIL) 25 MG tablet Take 1 tablet (25 mg total) by mouth daily.  Marland Kitchen losartan (COZAAR) 100 MG tablet TAKE 1 TABLET(100 MG) BY MOUTH DAILY  . meclizine (ANTIVERT) 25 MG tablet Take 1 tablet (25 mg total) by mouth 3 (three) times daily as needed for dizziness.  . Multiple Vitamin (MULTIVITAMIN) tablet Take 1 tablet by mouth daily.  . Naltrexone-buPROPion HCl ER 8-90 MG TB12 Take by mouth. Takes two twice daily.  . NON FORMULARY [facility] Allen-tec - 1 tab daily  . omeprazole (PRILOSEC) 40 MG capsule Take 1 capsule (40 mg total) by mouth daily.  . pravastatin (PRAVACHOL) 40 MG tablet Take 1 tablet (40 mg total) by mouth daily.  . [DISCONTINUED] Naltrexone-buPROPion HCl ER (CONTRAVE) 8-90 MG TB12 Start 1 tab qAM x1 week, then 1 tab BID x1 week, then 2 tabs qAM and 1 tab qPM for 1 week, then 2 tabs BID  . iron dextran complex in sodium chloride 0.9 % 500 mL Inject into the vein once.   No facility-administered encounter medications on file as of 02/11/2019.     Allergies as of 02/11/2019 - Review Complete 02/11/2019  Allergen Reaction Noted  . Molds & smuts  07/15/2018    Past Medical History:  Diagnosis Date  . Allergic rhinitis   . Allergy   . Anemia    giving blood every six weeks  . Barrett's esophagus   . Chickenpox   . Colon polyps   . GERD (gastroesophageal reflux disease)   . Hyperlipidemia   . Hypertension   . Iron deficiency anemia 11/11/2018  . Migraines   . Osteoarthritis    osteoarthritis    Past Surgical History:  Procedure Laterality  Date  . COLONOSCOPY    . ESOPHAGOGASTRODUODENOSCOPY ENDOSCOPY      Family History  Problem Relation Age of Onset  . Lung cancer Mother   . COPD Mother   . Lung cancer Father   . Stroke Father   . Spina bifida Sister   . Cirrhosis Brother   . Colon cancer Neg Hx   . Esophageal cancer Neg Hx   . Rectal cancer Neg Hx   . Stomach cancer Neg Hx     Social History   Socioeconomic History  . Marital status: Married    Spouse name: Not on file  . Number of children: 1  . Years of education: Not on file  . Highest education level: Not on file  Occupational History  . Not on file  Social Needs  . Financial resource strain: Not on file  . Food insecurity:    Worry: Not on file    Inability: Not on file  . Transportation needs:    Medical: Not on file    Non-medical: Not on file  Tobacco Use  . Smoking status: Former Smoker    Packs/day: 2.00    Years: 36.00    Pack years: 72.00    Last attempt to quit: 2004    Years since quitting: 16.3  . Smokeless tobacco: Never Used  Substance and Sexual Activity  . Alcohol use: Yes    Alcohol/week: 5.0 standard drinks    Types: 5 Shots of liquor per week    Comment: occasional  . Drug use: No  . Sexual activity: Not on file  Lifestyle  . Physical activity:    Days per week: Not on file    Minutes per session: Not on file  . Stress: Not on file  Relationships  . Social connections:    Talks on phone: Not on file    Gets together: Not on file    Attends religious service: Not on file    Active member of club or organization: Not on file    Attends meetings of clubs or organizations: Not on file    Relationship status: Not on file  . Intimate partner violence:    Fear of current or ex partner: Not on file    Emotionally abused: Not on file    Physically abused: Not on file    Forced sexual activity: Not on file  Other Topics Concern  . Not on file  Social History Narrative  . Not on file      Review of systems:  Review of Systems as per HPI All other systems reviewed and are negative.   Physical Exam: Vitals were not taken and physical exam was not performed during this virtual visit.  Data Reviewed:  Reviewed labs, radiology imaging, old records and pertinent past GI work up   Assessment and Plan/Recommendations:  63 year old male with iron deficiency anemia in January 2020, improved with IV iron therapy.  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  Hence the current recommendation is to proceed with enteroscopy only if has overt active GI bleeding.  Patient understood and is in agreement. Continue oral iron therapy, iron tablets 2-3 times daily with meals Monitor CBC and iron panel once every 3 months through PMD's office  Start Metamucil 1 teaspoon mixed in 8 ounces liquid 2-3 times daily with meals  Avoid excessive straining during defecation Hydrocortisone 25 mg (Anusol) suppository daily at bedtime for 5 to 7 days  Schedule hemorrhoidal band ligation in 4 to 6 weeks to treat symptomatic hemorrhoids   K. Denzil Magnuson , MD   CC: Leone Haven, MD

## 2019-02-11 NOTE — Patient Instructions (Addendum)
If you are age 62 or older, your body mass index should be between 23-30. Your Body mass index is 33.45 kg/m. If this is out of the aforementioned range listed, please consider follow up with your Primary Care Provider.  If you are age 54 or younger, your body mass index should be between 19-25. Your Body mass index is 33.45 kg/m. If this is out of the aformentioned range listed, please consider follow up with your Primary Care Provider.   Continue oral iron therapy, iron tablets 2-3 times daily with meals  Monitor CBC and iron panel once every 3 months through PMD's office  Start Metamucil 1 teaspoon mixed in 8 ounces liquid 2-3 times daily with meals   Avoid excessive straining during defecation, do not sit on toilet for more than 2 or 3 minutes.  Hydrocortisone 25 mg (Anusol) suppository daily at bedtime for 5 to 7 days as needed  Schedule hemorrhoidal band ligation to treat symptomatic hemorrhoids next available appointment in few weeks.  Currently elective procedures are on hold due to coronavirus pandemic.  We will contact you regarding having this procedure done.  Thank you for choosing me and [facility].   Harl Bowie, MD

## 2019-02-12 ENCOUNTER — Other Ambulatory Visit: Payer: Self-pay | Admitting: Family Medicine

## 2019-02-15 ENCOUNTER — Other Ambulatory Visit: Payer: Self-pay | Admitting: Family Medicine

## 2019-02-15 DIAGNOSIS — I10 Essential (primary) hypertension: Secondary | ICD-10-CM

## 2019-02-17 ENCOUNTER — Other Ambulatory Visit: Payer: Self-pay

## 2019-02-17 MED ORDER — LOSARTAN POTASSIUM 100 MG PO TABS: ORAL_TABLET | ORAL | 1 refills | Status: DC

## 2019-02-26 NOTE — Telephone Encounter (Signed)
Open in error

## 2019-03-11 ENCOUNTER — Other Ambulatory Visit: Payer: Self-pay | Admitting: Family Medicine

## 2019-03-11 DIAGNOSIS — D509 Iron deficiency anemia, unspecified: Secondary | ICD-10-CM

## 2019-03-31 ENCOUNTER — Encounter: Payer: Self-pay | Admitting: Oncology

## 2019-04-04 ENCOUNTER — Encounter: Payer: Self-pay | Admitting: Oncology

## 2019-04-07 ENCOUNTER — Other Ambulatory Visit: Payer: Self-pay

## 2019-04-08 ENCOUNTER — Inpatient Hospital Stay: Payer: Managed Care, Other (non HMO) | Attending: Oncology | Admitting: Oncology

## 2019-04-08 ENCOUNTER — Inpatient Hospital Stay: Payer: Managed Care, Other (non HMO)

## 2019-04-08 ENCOUNTER — Encounter: Payer: Self-pay | Admitting: Oncology

## 2019-04-08 DIAGNOSIS — D500 Iron deficiency anemia secondary to blood loss (chronic): Secondary | ICD-10-CM | POA: Diagnosis not present

## 2019-04-08 DIAGNOSIS — Z79899 Other long term (current) drug therapy: Secondary | ICD-10-CM

## 2019-04-08 DIAGNOSIS — Z825 Family history of asthma and other chronic lower respiratory diseases: Secondary | ICD-10-CM

## 2019-04-08 DIAGNOSIS — Z823 Family history of stroke: Secondary | ICD-10-CM

## 2019-04-08 DIAGNOSIS — Z7289 Other problems related to lifestyle: Secondary | ICD-10-CM

## 2019-04-08 DIAGNOSIS — Z801 Family history of malignant neoplasm of trachea, bronchus and lung: Secondary | ICD-10-CM

## 2019-04-08 DIAGNOSIS — Z87891 Personal history of nicotine dependence: Secondary | ICD-10-CM

## 2019-04-08 DIAGNOSIS — D721 Eosinophilia, unspecified: Secondary | ICD-10-CM

## 2019-04-08 DIAGNOSIS — R5383 Other fatigue: Secondary | ICD-10-CM

## 2019-04-08 DIAGNOSIS — Z8349 Family history of other endocrine, nutritional and metabolic diseases: Secondary | ICD-10-CM

## 2019-04-08 NOTE — Progress Notes (Signed)
HEMATOLOGY-ONCOLOGY TeleHEALTH VISIT PROGRESS NOTE  I connected with Collin Mills on 04/08/19 at  8:45 AM EDT by video enabled telemedicine visit and verified that I am speaking with the correct person using two identifiers. I discussed the limitations, risks, security and privacy concerns of performing an evaluation and management service by telemedicine and the availability of in-person appointments. I also discussed with the patient that there may be a patient responsible charge related to this service. The patient expressed understanding and agreed to proceed.   Other persons participating in the visit and their role in the encounter:   Janeann Merl, RN, check in patient.   Patient's location: Home  Provider's location: home office Chief Complaint: Follow-up for iron deficiency anemia   INTERVAL HISTORY Collin Mills is a 63 y.o. male who has above history reviewed by me today presents for follow up visit for management of iron deficiency anemia Problems and complaints are listed below:  Patient stopped oral iron supplementation for about a week.  Reports doing very well at baseline.  Bleeding events. No new complaints today.  Review of Systems  Constitutional: Positive for fatigue. Negative for appetite change, chills, fever and unexpected weight change.  HENT:   Negative for hearing loss and voice change.   Eyes: Negative for eye problems and icterus.  Respiratory: Negative for chest tightness, cough and shortness of breath.   Cardiovascular: Negative for chest pain and leg swelling.  Gastrointestinal: Negative for abdominal distention and abdominal pain.  Endocrine: Negative for hot flashes.  Genitourinary: Negative for difficulty urinating, dysuria and frequency.   Musculoskeletal: Negative for arthralgias.  Skin: Negative for itching and rash.  Neurological: Negative for light-headedness and numbness.  Hematological: Negative for adenopathy. Does not  bruise/bleed easily.  Psychiatric/Behavioral: Negative for confusion.    Past Medical History:  Diagnosis Date  . Allergic rhinitis   . Allergy   . Anemia    giving blood every six weeks  . Barrett's esophagus   . Chickenpox   . Colon polyps   . GERD (gastroesophageal reflux disease)   . Hyperlipidemia   . Hypertension   . Iron deficiency anemia 11/11/2018  . Migraines   . Osteoarthritis    osteoarthritis   Past Surgical History:  Procedure Laterality Date  . COLONOSCOPY    . ESOPHAGOGASTRODUODENOSCOPY ENDOSCOPY      Family History  Problem Relation Age of Onset  . Lung cancer Mother   . COPD Mother   . Lung cancer Father   . Stroke Father   . Spina bifida Sister   . Cirrhosis Brother   . Colon cancer Neg Hx   . Esophageal cancer Neg Hx   . Rectal cancer Neg Hx   . Stomach cancer Neg Hx     Social History   Socioeconomic History  . Marital status: Married    Spouse name: Not on file  . Number of children: 1  . Years of education: Not on file  . Highest education level: Not on file  Occupational History  . Not on file  Social Needs  . Financial resource strain: Not on file  . Food insecurity    Worry: Not on file    Inability: Not on file  . Transportation needs    Medical: Not on file    Non-medical: Not on file  Tobacco Use  . Smoking status: Former Smoker    Packs/day: 2.00    Years: 36.00    Pack years: 72.00  Quit date: 2004    Years since quitting: 16.4  . Smokeless tobacco: Never Used  Substance and Sexual Activity  . Alcohol use: Yes    Alcohol/week: 5.0 standard drinks    Types: 5 Shots of liquor per week    Comment: occasional  . Drug use: No  . Sexual activity: Not on file  Lifestyle  . Physical activity    Days per week: Not on file    Minutes per session: Not on file  . Stress: Not on file  Relationships  . Social Herbalist on phone: Not on file    Gets together: Not on file    Attends religious service: Not on  file    Active member of club or organization: Not on file    Attends meetings of clubs or organizations: Not on file    Relationship status: Not on file  . Intimate partner violence    Fear of current or ex partner: Not on file    Emotionally abused: Not on file    Physically abused: Not on file    Forced sexual activity: Not on file  Other Topics Concern  . Not on file  Social History Narrative  . Not on file    Current Outpatient Medications on File Prior to Visit  Medication Sig Dispense Refill  . amLODipine (NORVASC) 5 MG tablet TAKE 1 TABLET(5 MG) BY MOUTH DAILY 90 tablet 1  . aspirin 81 MG tablet Take 81 mg by mouth daily.    Marland Kitchen CONTRAVE 8-90 MG TB12 TAKE 1 TABLET EVERY MORNING X7 THEN 1 TWICE DAILY X7 THEN 2 EVERY MORNING AND 1 EVERY EVENING X7 THEN 2 TWICE DAILY 120 tablet 0  . fluticasone (FLONASE) 50 MCG/ACT nasal spray Place into both nostrils daily.    . hydrochlorothiazide (HYDRODIURIL) 25 MG tablet TAKE 1 TABLET(25 MG) BY MOUTH DAILY 90 tablet 1  . hydrocortisone (ANUSOL-HC) 25 MG suppository Place 1 suppository (25 mg total) rectally at bedtime. Use for 5-7 days. 12 suppository 0  . iron dextran complex in sodium chloride 0.9 % 500 mL Inject into the vein once.    Marland Kitchen losartan (COZAAR) 100 MG tablet TAKE 1 TABLET(100 MG) BY MOUTH DAILY 90 tablet 1  . meclizine (ANTIVERT) 25 MG tablet Take 1 tablet (25 mg total) by mouth 3 (three) times daily as needed for dizziness. 30 tablet 0  . Multiple Vitamin (MULTIVITAMIN) tablet Take 1 tablet by mouth daily.    . NON FORMULARY [facility]-tec - 1 tab daily    . omeprazole (PRILOSEC) 40 MG capsule Take 1 capsule (40 mg total) by mouth daily. 90 capsule 3  . pravastatin (PRAVACHOL) 40 MG tablet Take 1 tablet (40 mg total) by mouth daily. 90 tablet 2   No current facility-administered medications on file prior to visit.     Allergies  Allergen Reactions  . Molds & Smuts     Itching and sneezing         Observations/Objective: There were no vitals filed for this visit. There is no height or weight on file to calculate BMI.  Physical Exam  Constitutional: He is oriented to person, place, and time. No distress.  HENT:  Head: Normocephalic and atraumatic.  Pulmonary/Chest: Effort normal.  Neurological: He is alert and oriented to person, place, and time.  Psychiatric: Affect normal.    CBC    Component Value Date/Time   WBC 9.2 11/13/2018 1136   WBC 7.3 01/18/2018 0758  RBC 4.11 (L) 11/13/2018 1136   RBC 4.77 01/18/2018 0758   HGB 10.6 (L) 11/13/2018 1136   HCT 33.3 (L) 11/13/2018 1136   PLT 378 11/13/2018 1136   MCV 81 11/13/2018 1136   MCV 82 12/05/2011 0820   MCH 25.8 (L) 11/13/2018 1136   MCH 28.0 05/22/2017 0846   MCHC 31.8 11/13/2018 1136   MCHC 33.6 01/18/2018 0758   RDW 17.7 (H) 11/13/2018 1136   RDW 16.1 (H) 12/05/2011 0820   LYMPHSABS 2.5 11/13/2018 1136   LYMPHSABS 2.9 12/05/2011 0820   MONOABS 0.5 05/22/2017 0846   MONOABS 0.6 12/05/2011 0820   EOSABS 0.8 (H) 11/13/2018 1136   EOSABS 1.2 (H) 12/05/2011 0820   BASOSABS 0.1 11/13/2018 1136   BASOSABS 0.1 12/05/2011 0820    CMP     Component Value Date/Time   NA 140 10/29/2018 0740   NA 142 12/05/2011 0820   K 3.8 10/29/2018 0740   K 4.0 12/05/2011 0820   CL 99 10/29/2018 0740   CL 107 12/05/2011 0820   CO2 24 10/29/2018 0740   CO2 28 12/05/2011 0820   GLUCOSE 127 (H) 10/29/2018 0740   GLUCOSE 120 (H) 01/18/2018 0758   GLUCOSE 81 12/05/2011 0820   BUN 10 10/29/2018 0740   BUN 10 12/05/2011 0820   CREATININE 1.14 10/29/2018 0740   CREATININE 0.92 12/05/2011 0820   CALCIUM 9.8 10/29/2018 0740   CALCIUM 9.5 12/05/2011 0820   PROT 6.9 01/18/2018 0758   PROT 6.6 08/13/2017 0712   PROT 7.7 12/05/2011 0820   ALBUMIN 4.2 01/18/2018 0758   ALBUMIN 4.3 08/13/2017 0712   ALBUMIN 4.4 12/05/2011 0820   AST 23 01/18/2018 0758   AST 30 12/05/2011 0820   ALT 33 01/18/2018 0758   ALT 40 12/05/2011 0820    ALKPHOS 53 01/18/2018 0758   ALKPHOS 57 12/05/2011 0820   BILITOT 0.5 01/18/2018 0758   BILITOT 0.3 08/13/2017 0712   BILITOT 0.6 12/05/2011 0820   GFRNONAA 69 10/29/2018 0740   GFRNONAA >60 12/05/2011 0820   GFRAA 80 10/29/2018 0740   GFRAA >60 12/05/2011 0820     Assessment and Plan: 1. Iron deficiency anemia due to chronic blood loss   2. Eosinophilia     Labs reviewed and discussed with patient.  Stable hemoglobin and iron store.  No need for any additional iron treatments.  Advised patient to oral iron supplementation. Repeat blood work in 6 months to ensure stability.  Patient asked about eosinophilia.  Discussed with patient that eosinophilia can be secondary to allergy, drugs, inflammation.  Absolute eosinophilia is less than 5.  Continue monitor.  Repeat blood work in 6 months.  Follow Up Instructions: 6 months   I discussed the assessment and treatment plan with the patient. The patient was provided an opportunity to ask questions and all were answered. The patient agreed with the plan and demonstrated an understanding of the instructions.  The patient was advised to call back or seek an in-person evaluation if the symptoms worsen or if the condition fails to improve as anticipated.   I provided 16 minutes of face-to-face video visit time during this encounter, and > 50% was spent counseling as documented under my assessment & plan.  Earlie Server, MD 04/08/2019 10:50 AM

## 2019-04-08 NOTE — Progress Notes (Signed)
Patient contacted for telehealth visit. No concerns voiced.

## 2019-04-14 ENCOUNTER — Encounter: Payer: Self-pay | Admitting: Family Medicine

## 2019-04-14 ENCOUNTER — Ambulatory Visit (INDEPENDENT_AMBULATORY_CARE_PROVIDER_SITE_OTHER): Payer: Managed Care, Other (non HMO) | Admitting: Family Medicine

## 2019-04-14 ENCOUNTER — Other Ambulatory Visit: Payer: Self-pay

## 2019-04-14 DIAGNOSIS — D500 Iron deficiency anemia secondary to blood loss (chronic): Secondary | ICD-10-CM

## 2019-04-14 DIAGNOSIS — I10 Essential (primary) hypertension: Secondary | ICD-10-CM

## 2019-04-14 DIAGNOSIS — E6609 Other obesity due to excess calories: Secondary | ICD-10-CM | POA: Diagnosis not present

## 2019-04-14 DIAGNOSIS — R7303 Prediabetes: Secondary | ICD-10-CM

## 2019-04-14 DIAGNOSIS — Z86010 Personal history of colonic polyps: Secondary | ICD-10-CM | POA: Diagnosis not present

## 2019-04-14 DIAGNOSIS — Z6834 Body mass index (BMI) 34.0-34.9, adult: Secondary | ICD-10-CM

## 2019-04-14 DIAGNOSIS — E785 Hyperlipidemia, unspecified: Secondary | ICD-10-CM

## 2019-04-14 NOTE — Progress Notes (Signed)
Virtual Visit via video Note  This visit type was conducted due to national recommendations for restrictions regarding the COVID-19 pandemic (e.g. social distancing).  This format is felt to be most appropriate for this patient at this time.  All issues noted in this document were discussed and addressed.  No physical exam was performed (except for noted visual exam findings with Video Visits).   I connected with Collin Mills today at  1:15 PM EDT by a video enabled telemedicine application and verified that I am speaking with the correct person using two identifiers. Location patient: home Location provider: work  Persons participating in the virtual visit: patient, provider, Anav Lammert (wife)  I discussed the limitations, risks, security and privacy concerns of performing an evaluation and management service by telephone and the availability of in person appointments. I also discussed with the patient that there may be a patient responsible charge related to this service. The patient expressed understanding and agreed to proceed.   Reason for visit: follow-up  HPI: HYPERTENSION  Disease Monitoring  Home BP Monitoring checking and running in the normal range Chest pain- no    Dyspnea- no Medications  Compliance-  Taking losartan, HCTZ.  Edema- no  Iron deficiency anemia: Patient underwent GI evaluation with several colon polyps and hemorrhoids found.  Also had a capsule endoscopy that revealed several AVMs that were nonbleeding.  He has completed iron therapy through hematology and is no longer on iron.  He notes no bleeding issues.  He is to have a colonoscopy every 3 years given his colon polyps.  Obesity: Patient was on Contrave for about a month and then he was off of it until the end of May.  He restarted it at the end of May and has lost 5 pounds with starting back on it.  He is walking about a mile and a half each day.  He has cut out junk food over the last week.  He is  eating fresh vegetables and salads.  He does drink 1 diet soda a day though is mostly drinking water.  He notes the only thing he has noticed with the Contrave is that coffee does not taste as good as it used to.  Otherwise no side effects or changes in taste or smell.     ROS: See pertinent positives and negatives per HPI.  Past Medical History:  Diagnosis Date  . Allergic rhinitis   . Allergy   . Anemia    giving blood every six weeks  . Barrett's esophagus   . Chickenpox   . Colon polyps   . GERD (gastroesophageal reflux disease)   . Hyperlipidemia   . Hypertension   . Iron deficiency anemia 11/11/2018  . Migraines   . Osteoarthritis    osteoarthritis    Past Surgical History:  Procedure Laterality Date  . COLONOSCOPY    . ESOPHAGOGASTRODUODENOSCOPY ENDOSCOPY      Family History  Problem Relation Age of Onset  . Lung cancer Mother   . COPD Mother   . Lung cancer Father   . Stroke Father   . Spina bifida Sister   . Cirrhosis Brother   . Colon cancer Neg Hx   . Esophageal cancer Neg Hx   . Rectal cancer Neg Hx   . Stomach cancer Neg Hx     SOCIAL HX: Former smoker   Current Outpatient Medications:  .  amLODipine (NORVASC) 5 MG tablet, TAKE 1 TABLET(5 MG) BY MOUTH DAILY, Disp: 90  tablet, Rfl: 1 .  aspirin 81 MG tablet, Take 81 mg by mouth daily., Disp: , Rfl:  .  CONTRAVE 8-90 MG TB12, TAKE 1 TABLET EVERY MORNING X7 THEN 1 TWICE DAILY X7 THEN 2 EVERY MORNING AND 1 EVERY EVENING X7 THEN 2 TWICE DAILY, Disp: 120 tablet, Rfl: 0 .  fluticasone (FLONASE) 50 MCG/ACT nasal spray, Place into both nostrils daily., Disp: , Rfl:  .  hydrochlorothiazide (HYDRODIURIL) 25 MG tablet, TAKE 1 TABLET(25 MG) BY MOUTH DAILY, Disp: 90 tablet, Rfl: 1 .  hydrocortisone (ANUSOL-HC) 25 MG suppository, Place 1 suppository (25 mg total) rectally at bedtime. Use for 5-7 days., Disp: 12 suppository, Rfl: 0 .  losartan (COZAAR) 100 MG tablet, TAKE 1 TABLET(100 MG) BY MOUTH DAILY, Disp: 90  tablet, Rfl: 1 .  meclizine (ANTIVERT) 25 MG tablet, Take 1 tablet (25 mg total) by mouth 3 (three) times daily as needed for dizziness., Disp: 30 tablet, Rfl: 0 .  Multiple Vitamin (MULTIVITAMIN) tablet, Take 1 tablet by mouth daily., Disp: , Rfl:  .  NON FORMULARY, [employer]-tec - 1 tab daily, Disp: , Rfl:  .  omeprazole (PRILOSEC) 40 MG capsule, Take 1 capsule (40 mg total) by mouth daily., Disp: 90 capsule, Rfl: 3 .  pravastatin (PRAVACHOL) 40 MG tablet, Take 1 tablet (40 mg total) by mouth daily., Disp: 90 tablet, Rfl: 2  EXAM:  VITALS per patient if applicable: None.  GENERAL: alert, oriented, appears well and in no acute distress  HEENT: atraumatic, conjunttiva clear, no obvious abnormalities on inspection of external nose and ears  NECK: normal movements of the head and neck  LUNGS: on inspection no signs of respiratory distress, breathing rate appears normal, no obvious gross SOB, gasping or wheezing  CV: no obvious cyanosis  MS: moves all visible extremities without noticeable abnormality  PSYCH/NEURO: pleasant and cooperative, no obvious depression or anxiety, speech and thought processing grossly intact  ASSESSMENT AND PLAN:  Discussed the following assessment and plan:  Essential hypertension Reports adequately controlled.  Continue current medication.  He will go to [facility] to have labs drawn.  Hx of adenomatous colonic polyps Colonoscopy to be done every 3 years.  Iron deficiency anemia Patient has completed evaluation for this.  Presumed to be related to 1 of the potential sources of bleeding seen on evaluation.  He will monitor for any bleeding issues.  He will continue to see hematology every 6 months.  Obesity Patient is tolerated Contrave well.  He is down 5 pounds over the last 4 weeks with use of this medication and diet and exercise.  I encouraged continued diet and exercise.  He will continue on Contrave.  We will follow-up in 2 months.   Prediabetes Check A1c.  Hyperlipidemia Check lipid panel.  Social distancing precautions and sick precautions given regarding COVID-19.   I discussed the assessment and treatment plan with the patient. The patient was provided an opportunity to ask questions and all were answered. The patient agreed with the plan and demonstrated an understanding of the instructions.   The patient was advised to call back or seek an in-person evaluation if the symptoms worsen or if the condition fails to improve as anticipated.   [name], MD

## 2019-04-14 NOTE — Assessment & Plan Note (Signed)
Colonoscopy to be done every 3 years.

## 2019-04-14 NOTE — Assessment & Plan Note (Signed)
Reports adequately controlled.  Continue current medication.  He will go to [facility] to have labs drawn.

## 2019-04-14 NOTE — Assessment & Plan Note (Signed)
Check A1c. 

## 2019-04-14 NOTE — Assessment & Plan Note (Signed)
Check lipid panel  

## 2019-04-14 NOTE — Assessment & Plan Note (Signed)
Patient is tolerated Contrave well.  He is down 5 pounds over the last 4 weeks with use of this medication and diet and exercise.  I encouraged continued diet and exercise.  He will continue on Contrave.  We will follow-up in 2 months.

## 2019-04-14 NOTE — Assessment & Plan Note (Signed)
Patient has completed evaluation for this.  Presumed to be related to 1 of the potential sources of bleeding seen on evaluation.  He will monitor for any bleeding issues.  He will continue to see hematology every 6 months.

## 2019-05-03 LAB — BASIC METABOLIC PANEL
BUN/Creatinine Ratio: 14 (ref 10–24)
BUN: 15 mg/dL (ref 8–27)
CO2: 23 mmol/L (ref 20–29)
Calcium: 9.7 mg/dL (ref 8.6–10.2)
Chloride: 103 mmol/L (ref 96–106)
Creatinine, Ser: 1.04 mg/dL (ref 0.76–1.27)
GFR calc Af Amer: 89 mL/min/{1.73_m2} (ref >59)
GFR calc non Af Amer: 77 mL/min/{1.73_m2} (ref >59)
Glucose: 123 mg/dL — ABNORMAL HIGH (ref 65–99)
Potassium: 3.8 mmol/L (ref 3.5–5.2)
Sodium: 143 mmol/L (ref 134–144)

## 2019-05-03 LAB — LIPID PANEL
Chol/HDL Ratio: 3.7 ratio (ref 0.0–5.0)
Cholesterol, Total: 191 mg/dL (ref 100–199)
HDL: 51 mg/dL (ref >39)
LDL Calculated: 103 mg/dL — ABNORMAL HIGH (ref 0–99)
Triglycerides: 185 mg/dL — ABNORMAL HIGH (ref 0–149)
VLDL Cholesterol Cal: 37 mg/dL (ref 5–40)

## 2019-05-03 LAB — HEMOGLOBIN A1C
Est. average glucose Bld gHb Est-mCnc: 137 mg/dL
Hgb A1c MFr Bld: 6.4 % — ABNORMAL HIGH (ref 4.8–5.6)

## 2019-05-10 ENCOUNTER — Other Ambulatory Visit: Payer: Self-pay | Admitting: Family Medicine

## 2019-05-22 ENCOUNTER — Other Ambulatory Visit: Payer: Self-pay | Admitting: Family Medicine

## 2019-05-22 DIAGNOSIS — E782 Mixed hyperlipidemia: Secondary | ICD-10-CM

## 2019-05-22 MED ORDER — ATORVASTATIN CALCIUM 40 MG PO TABS: 40.0000 mg | ORAL_TABLET | Freq: Every day | ORAL | 3 refills | Status: DC

## 2019-06-12 ENCOUNTER — Other Ambulatory Visit: Payer: Self-pay

## 2019-06-13 LAB — HEPATIC FUNCTION PANEL
ALT: 83 IU/L — ABNORMAL HIGH (ref 0–44)
AST: 60 IU/L — ABNORMAL HIGH (ref 0–40)
Albumin: 4.8 g/dL (ref 3.8–4.8)
Alkaline Phosphatase: 70 IU/L (ref 39–117)
Bilirubin Total: 0.7 mg/dL (ref 0.0–1.2)
Bilirubin, Direct: 0.21 mg/dL (ref 0.00–0.40)
Total Protein: 7.3 g/dL (ref 6.0–8.5)

## 2019-06-13 LAB — LIPID PANEL
Chol/HDL Ratio: 3.1 ratio (ref 0.0–5.0)
Cholesterol, Total: 144 mg/dL (ref 100–199)
HDL: 47 mg/dL (ref >39)
LDL Calculated: 61 mg/dL (ref 0–99)
Triglycerides: 182 mg/dL — ABNORMAL HIGH (ref 0–149)
VLDL Cholesterol Cal: 36 mg/dL (ref 5–40)

## 2019-06-15 ENCOUNTER — Other Ambulatory Visit: Payer: Self-pay | Admitting: Family Medicine

## 2019-06-15 DIAGNOSIS — R7989 Other specified abnormal findings of blood chemistry: Secondary | ICD-10-CM

## 2019-06-15 DIAGNOSIS — R945 Abnormal results of liver function studies: Secondary | ICD-10-CM

## 2019-06-16 ENCOUNTER — Other Ambulatory Visit: Payer: Self-pay

## 2019-06-16 ENCOUNTER — Other Ambulatory Visit: Payer: Self-pay | Admitting: Family Medicine

## 2019-06-16 ENCOUNTER — Encounter: Payer: Self-pay | Admitting: Family Medicine

## 2019-06-16 ENCOUNTER — Ambulatory Visit: Payer: Managed Care, Other (non HMO) | Admitting: Family Medicine

## 2019-06-16 VITALS — BP 120/80 | HR 75 | Temp 97.9°F | Ht 68.5 in | Wt 227.6 lb

## 2019-06-16 DIAGNOSIS — I10 Essential (primary) hypertension: Secondary | ICD-10-CM

## 2019-06-16 DIAGNOSIS — R945 Abnormal results of liver function studies: Secondary | ICD-10-CM

## 2019-06-16 DIAGNOSIS — R7989 Other specified abnormal findings of blood chemistry: Secondary | ICD-10-CM | POA: Insufficient documentation

## 2019-06-16 DIAGNOSIS — E785 Hyperlipidemia, unspecified: Secondary | ICD-10-CM

## 2019-06-16 DIAGNOSIS — Z6834 Body mass index (BMI) 34.0-34.9, adult: Secondary | ICD-10-CM

## 2019-06-16 DIAGNOSIS — Z23 Encounter for immunization: Secondary | ICD-10-CM | POA: Diagnosis not present

## 2019-06-16 DIAGNOSIS — E6609 Other obesity due to excess calories: Secondary | ICD-10-CM

## 2019-06-16 NOTE — Assessment & Plan Note (Signed)
Continue Lipitor.  Plan to recheck LFTs in 2 weeks.

## 2019-06-16 NOTE — Progress Notes (Signed)
  [name], MD Phone: [phone]  Collin Mills is a 63 y.o. male who presents today for follow-up.  Hypertension: Typically 130s over 70s-80s.  Taking losartan, amlodipine, and HCTZ.  No chest pain, shortness of breath, or edema.  Obesity: Patient has lost 7 pounds on Contrave.  He had no side effects with this.  He has just started to cut down on eggs, bacon, and sausage intake.  He was drinking 3-4 shots of liquor daily since April and thinks that may be contributing to his weight as well.  He is ready to decrease his intake of alcohol.  This is also contributing to his elevated LFTs.  He has been walking for exercise.  Hyperlipidemia: Taking Lipitor.  No epicondyle pain or myalgias.  Social History   Tobacco Use  Smoking Status Former Smoker  . Packs/day: 2.00  . Years: 36.00  . Pack years: 72.00  . Quit date: 2004  . Years since quitting: 16.6  Smokeless Tobacco Never Used     ROS see history of present illness  Objective  Physical Exam Vitals:   06/16/19 1420  BP: 120/80  Pulse: 75  Temp: 97.9 F (36.6 C)  SpO2: 98%    BP Readings from Last 3 Encounters:  06/16/19 120/80  02/11/19 132/83  12/31/18 (!) 142/82   Wt Readings from Last 3 Encounters:  06/16/19 227 lb 9.6 oz (103.2 kg)  02/11/19 220 lb (99.8 kg)  12/31/18 229 lb 6.4 oz (104.1 kg)    Physical Exam Constitutional:      General: He is not in acute distress.    Appearance: He is not diaphoretic.  Cardiovascular:     Rate and Rhythm: Normal rate and regular rhythm.     Heart sounds: Normal heart sounds.  Pulmonary:     Effort: Pulmonary effort is normal.     Breath sounds: Normal breath sounds.  Abdominal:     General: Bowel sounds are normal. There is no distension.     Palpations: Abdomen is soft.     Tenderness: There is no abdominal tenderness. There is no guarding or rebound.  Musculoskeletal:     Right lower leg: No edema.     Left lower leg: No edema.  Skin:  General: Skin is warm and dry.  Neurological:     Mental Status: He is alert.      Assessment/Plan: Please see individual problem list.  Essential hypertension Relatively well controlled.  Continue current medications.  Discussed dietary changes and exercise.  Plan to recheck in about 6 weeks with an in person or virtual visit to see if this improves with decreased alcohol intake.  Hyperlipidemia Continue Lipitor.  Plan to recheck LFTs in 2 weeks.  Obesity Discussed continued dietary changes and exercise.  Discontinue Contrave given lack of benefit.  Follow-up in 6 weeks.  Elevated LFTs Likely related to alcohol intake though Lipitor could be contributing as well.  Discussed progressively decreasing his alcohol intake.  Discussed not quitting drinking all of a sudden given the risk of withdrawal and death.  Plan to recheck in 2 weeks.  He will decrease to 2 alcoholic beverages daily and then progressively decreased from negative.    Orders Placed This Encounter  Procedures  . Flu Vaccine QUAD 36+ mos IM  . Hepatic function panel    No orders of the defined types were placed in this encounter.    [name], MD Emily

## 2019-06-16 NOTE — Assessment & Plan Note (Signed)
Likely related to alcohol intake though Lipitor could be contributing as well.  Discussed progressively decreasing his alcohol intake.  Discussed not quitting drinking all of a sudden given the risk of withdrawal and death.  Plan to recheck in 2 weeks.  He will decrease to 2 alcoholic beverages daily and then progressively decreased from negative.

## 2019-06-16 NOTE — Patient Instructions (Signed)
Nice to see you. Please cut down on your alcohol intake to 2 beverages daily for a week or so and then progressively cut down from there.  If you develop any withdrawal symptoms please be evaluated. Please continue with exercise and decreasing fatty and salty food intake.

## 2019-06-16 NOTE — Assessment & Plan Note (Signed)
Relatively well controlled.  Continue current medications.  Discussed dietary changes and exercise.  Plan to recheck in about 6 weeks with an in person or virtual visit to see if this improves with decreased alcohol intake.

## 2019-06-16 NOTE — Assessment & Plan Note (Signed)
Discussed continued dietary changes and exercise.  Discontinue Contrave given lack of benefit.  Follow-up in 6 weeks.

## 2019-07-26 ENCOUNTER — Other Ambulatory Visit: Payer: Self-pay | Admitting: Family Medicine

## 2019-07-26 DIAGNOSIS — R945 Abnormal results of liver function studies: Secondary | ICD-10-CM

## 2019-07-26 DIAGNOSIS — R7989 Other specified abnormal findings of blood chemistry: Secondary | ICD-10-CM

## 2019-07-26 LAB — HEPATIC FUNCTION PANEL
ALT: 64 IU/L — ABNORMAL HIGH (ref 0–44)
AST: 41 IU/L — ABNORMAL HIGH (ref 0–40)
Albumin: 4.3 g/dL (ref 3.8–4.8)
Alkaline Phosphatase: 71 IU/L (ref 39–117)
Bilirubin Total: 0.4 mg/dL (ref 0.0–1.2)
Bilirubin, Direct: 0.14 mg/dL (ref 0.00–0.40)
Total Protein: 6.5 g/dL (ref 6.0–8.5)

## 2019-08-01 ENCOUNTER — Encounter: Payer: Self-pay | Admitting: Family Medicine

## 2019-08-01 ENCOUNTER — Ambulatory Visit (INDEPENDENT_AMBULATORY_CARE_PROVIDER_SITE_OTHER): Payer: Managed Care, Other (non HMO) | Admitting: Family Medicine

## 2019-08-01 ENCOUNTER — Other Ambulatory Visit: Payer: Self-pay

## 2019-08-01 VITALS — Ht 68.0 in | Wt 224.0 lb

## 2019-08-01 DIAGNOSIS — R7989 Other specified abnormal findings of blood chemistry: Secondary | ICD-10-CM | POA: Diagnosis not present

## 2019-08-01 DIAGNOSIS — M1990 Unspecified osteoarthritis, unspecified site: Secondary | ICD-10-CM | POA: Diagnosis not present

## 2019-08-01 DIAGNOSIS — I10 Essential (primary) hypertension: Secondary | ICD-10-CM

## 2019-08-01 DIAGNOSIS — Z6834 Body mass index (BMI) 34.0-34.9, adult: Secondary | ICD-10-CM

## 2019-08-01 DIAGNOSIS — E6609 Other obesity due to excess calories: Secondary | ICD-10-CM | POA: Diagnosis not present

## 2019-08-01 DIAGNOSIS — Z5181 Encounter for therapeutic drug level monitoring: Secondary | ICD-10-CM

## 2019-08-01 MED ORDER — MELOXICAM 7.5 MG PO TABS: 7.5000 mg | ORAL_TABLET | Freq: Every day | ORAL | 0 refills | Status: DC | PRN

## 2019-08-01 NOTE — Assessment & Plan Note (Signed)
Encouraged continued diet and exercise.  If his weight does not continue to trend down we could consider other medication management for weight loss though his insurance did not want to pay for alternative medications previously.

## 2019-08-01 NOTE — Assessment & Plan Note (Signed)
Adequately controlled.  Continue current regimen. 

## 2019-08-01 NOTE — Assessment & Plan Note (Signed)
Discussed avoiding Celebrex at this time given his LFTs.  Will start on Mobic.  Discussed checking renal function in several weeks.  Discussed risk of renal dysfunction with chronic use of this medication.  Discussed taking it with food to avoid stomach upset and irritation.

## 2019-08-01 NOTE — Progress Notes (Signed)
Virtual Visit via video Note  This visit type was conducted due to national recommendations for restrictions regarding the COVID-19 pandemic (e.g. social distancing).  This format is felt to be most appropriate for this patient at this time.  All issues noted in this document were discussed and addressed.  No physical exam was performed (except for noted visual exam findings with Video Visits).   I connected with Sheralyn Boatman today at  1:45 PM EDT by a video enabled telemedicine application or telephone and verified that I am speaking with the correct person using two identifiers. Location patient: home Location provider: work Persons participating in the virtual visit: patient, provider, Mrs Tsang (wife)  I discussed the limitations, risks, security and privacy concerns of performing an evaluation and management service by telephone and the availability of in person appointments. I also discussed with the patient that there may be a patient responsible charge related to this service. The patient expressed understanding and agreed to proceed.   Reason for visit: Follow-up.  HPI: Obesity: Patient's been walking for 23-30 minutes 4 days a week.  Diet has been a little bit better with decreased portion sizes of bacon, eggs, and sausage in the morning.  He has been having roast with vegetables and soups for meals.  Elevated LFTs: He has cut his alcohol intake down quite a bit.  He is not drinking during the week at this time.  He is having about 4 alcoholic beverages a day on Friday and Saturday and notes he is going to continue to cut that down.  No abdominal pain.  Hypertension: Typically 120s over low 80s.  Taking amlodipine, HCTZ, and losartan.  No chest pain, shortness of breath, or edema.  Osteoarthritis: Patient notes scattered osteoarthritis and pain related to this in several joints.  He was diagnosed with this some years ago and was prescribed Celebrex.  He wants to go back on  that.   ROS: See pertinent positives and negatives per HPI.  Past Medical History:  Diagnosis Date  . Allergic rhinitis   . Allergy   . Anemia    giving blood every six weeks  . Barrett's esophagus   . Chickenpox   . Colon polyps   . GERD (gastroesophageal reflux disease)   . Hyperlipidemia   . Hypertension   . Iron deficiency anemia 11/11/2018  . Migraines   . Osteoarthritis    osteoarthritis    Past Surgical History:  Procedure Laterality Date  . COLONOSCOPY    . ESOPHAGOGASTRODUODENOSCOPY ENDOSCOPY      Family History  Problem Relation Age of Onset  . Lung cancer Mother   . COPD Mother   . Lung cancer Father   . Stroke Father   . Spina bifida Sister   . Cirrhosis Brother   . Colon cancer Neg Hx   . Esophageal cancer Neg Hx   . Rectal cancer Neg Hx   . Stomach cancer Neg Hx     SOCIAL HX: Former smoker.   Current Outpatient Medications:  .  amLODipine (NORVASC) 5 MG tablet, TAKE 1 TABLET BY MOUTH EVERY DAY, Disp: 90 tablet, Rfl: 1 .  aspirin 81 MG tablet, Take 81 mg by mouth daily., Disp: , Rfl:  .  atorvastatin (LIPITOR) 40 MG tablet, Take 1 tablet (40 mg total) by mouth daily., Disp: 90 tablet, Rfl: 3 .  fluticasone (FLONASE) 50 MCG/ACT nasal spray, Place into both nostrils daily., Disp: , Rfl:  .  hydrochlorothiazide (HYDRODIURIL) 25 MG tablet,  TAKE 1 TABLET(25 MG) BY MOUTH DAILY, Disp: 90 tablet, Rfl: 1 .  hydrocortisone (ANUSOL-HC) 25 MG suppository, Place 1 suppository (25 mg total) rectally at bedtime. Use for 5-7 days., Disp: 12 suppository, Rfl: 0 .  losartan (COZAAR) 100 MG tablet, TAKE 1 TABLET(100 MG) BY MOUTH DAILY, Disp: 90 tablet, Rfl: 1 .  meclizine (ANTIVERT) 25 MG tablet, Take 1 tablet (25 mg total) by mouth 3 (three) times daily as needed for dizziness., Disp: 30 tablet, Rfl: 0 .  Multiple Vitamin (MULTIVITAMIN) tablet, Take 1 tablet by mouth daily., Disp: , Rfl:  .  NON FORMULARY, [employer]-tec - 1 tab daily, Disp: , Rfl:  .   omeprazole (PRILOSEC) 40 MG capsule, Take 1 capsule (40 mg total) by mouth daily., Disp: 90 capsule, Rfl: 3 .  pravastatin (PRAVACHOL) 40 MG tablet, TAKE 1 TABLET(40 MG) BY MOUTH DAILY, Disp: 90 tablet, Rfl: 3 .  meloxicam (MOBIC) 7.5 MG tablet, Take 1 tablet (7.5 mg total) by mouth daily as needed for pain., Disp: 30 tablet, Rfl: 0  EXAM:  VITALS per patient if applicable: None.  GENERAL: alert, oriented, appears well and in no acute distress  HEENT: atraumatic, conjunttiva clear, no obvious abnormalities on inspection of external nose and ears  NECK: normal movements of the head and neck  LUNGS: on inspection no signs of respiratory distress, breathing rate appears normal, no obvious gross SOB, gasping or wheezing  CV: no obvious cyanosis  MS: moves all visible extremities without noticeable abnormality  PSYCH/NEURO: pleasant and cooperative, no obvious depression or anxiety, speech and thought processing grossly intact  ASSESSMENT AND PLAN:  Discussed the following assessment and plan:  Essential hypertension Adequately controlled.  Continue current regimen.  Elevated LFTs Likely related to his alcohol intake.  He will have these rechecked in several weeks.  He will continue to decrease his alcohol intake with a goal of abstinence from alcohol.  Osteoarthritis Discussed avoiding Celebrex at this time given his LFTs.  Will start on Mobic.  Discussed checking renal function in several weeks.  Discussed risk of renal dysfunction with chronic use of this medication.  Discussed taking it with food to avoid stomach upset and irritation.  Obesity Encouraged continued diet and exercise.  If his weight does not continue to trend down we could consider other medication management for weight loss though his insurance did not want to pay for alternative medications previously.    I discussed the assessment and treatment plan with the patient. The patient was provided an opportunity to  ask questions and all were answered. The patient agreed with the plan and demonstrated an understanding of the instructions.   The patient was advised to call back or seek an in-person evaluation if the symptoms worsen or if the condition fails to improve as anticipated.   [name], MD

## 2019-08-01 NOTE — Assessment & Plan Note (Signed)
Likely related to his alcohol intake.  He will have these rechecked in several weeks.  He will continue to decrease his alcohol intake with a goal of abstinence from alcohol.

## 2019-08-10 ENCOUNTER — Other Ambulatory Visit: Payer: Self-pay | Admitting: Family Medicine

## 2019-08-21 ENCOUNTER — Encounter: Payer: Self-pay | Admitting: Family Medicine

## 2019-08-21 DIAGNOSIS — R945 Abnormal results of liver function studies: Secondary | ICD-10-CM

## 2019-08-21 DIAGNOSIS — R7989 Other specified abnormal findings of blood chemistry: Secondary | ICD-10-CM

## 2019-08-21 LAB — BASIC METABOLIC PANEL
BUN/Creatinine Ratio: 17 (ref 10–24)
BUN: 16 mg/dL (ref 8–27)
CO2: 24 mmol/L (ref 20–29)
Calcium: 9.7 mg/dL (ref 8.6–10.2)
Chloride: 103 mmol/L (ref 96–106)
Creatinine, Ser: 0.96 mg/dL (ref 0.76–1.27)
GFR calc Af Amer: 98 mL/min/{1.73_m2} (ref >59)
GFR calc non Af Amer: 84 mL/min/{1.73_m2} (ref >59)
Glucose: 138 mg/dL — ABNORMAL HIGH (ref 65–99)
Potassium: 4 mmol/L (ref 3.5–5.2)
Sodium: 142 mmol/L (ref 134–144)

## 2019-08-23 ENCOUNTER — Other Ambulatory Visit: Payer: Self-pay | Admitting: Family Medicine

## 2019-08-23 DIAGNOSIS — R7989 Other specified abnormal findings of blood chemistry: Secondary | ICD-10-CM

## 2019-08-23 DIAGNOSIS — R945 Abnormal results of liver function studies: Secondary | ICD-10-CM

## 2019-08-23 LAB — HEPATIC FUNCTION PANEL
ALT: 57 IU/L — ABNORMAL HIGH (ref 0–44)
AST: 31 IU/L (ref 0–40)
Albumin: 4.6 g/dL (ref 3.8–4.8)
Alkaline Phosphatase: 72 IU/L (ref 39–117)
Bilirubin Total: 0.6 mg/dL (ref 0.0–1.2)
Bilirubin, Direct: 0.17 mg/dL (ref 0.00–0.40)
Total Protein: 6.9 g/dL (ref 6.0–8.5)

## 2019-09-29 ENCOUNTER — Encounter: Payer: Self-pay | Admitting: Oncology

## 2019-10-06 ENCOUNTER — Inpatient Hospital Stay: Payer: Managed Care, Other (non HMO) | Attending: Oncology | Admitting: Oncology

## 2019-10-06 ENCOUNTER — Encounter: Payer: Self-pay | Admitting: Oncology

## 2019-10-06 DIAGNOSIS — D500 Iron deficiency anemia secondary to blood loss (chronic): Secondary | ICD-10-CM

## 2019-10-06 DIAGNOSIS — D7219 Other eosinophilia: Secondary | ICD-10-CM | POA: Diagnosis not present

## 2019-10-06 NOTE — Progress Notes (Signed)
HEMATOLOGY-ONCOLOGY TeleHEALTH VISIT PROGRESS NOTE  I connected with Collin Mills on 10/06/19 at 10:15 AM EST by video enabled telemedicine visit and verified that I am speaking with the correct person using two identifiers. I discussed the limitations, risks, security and privacy concerns of performing an evaluation and management service by telemedicine and the availability of in-person appointments. I also discussed with the patient that there may be a patient responsible charge related to this service. The patient expressed understanding and agreed to proceed.   Other persons participating in the visit and their role in the encounter:   Janeann Merl, RN, check in patient.   Patient's location: Home  Provider's location: home office Chief Complaint: Follow-up for iron deficiency anemia   INTERVAL HISTORY Collin Mills is a 63 y.o. male who has above history reviewed by me today presents for follow up visit for management of iron deficiency anemia Problems and complaints are listed below:  Patient reports doing well.  He has stopped oral iron supplementation since last visit.  Doing very well at baseline.  Denies any bleeding events.  No new complaints today.   Review of Systems  Constitutional: Negative for appetite change, chills, fatigue, fever and unexpected weight change.  HENT:   Negative for hearing loss and voice change.   Eyes: Negative for eye problems and icterus.  Respiratory: Negative for chest tightness, cough and shortness of breath.   Cardiovascular: Negative for chest pain and leg swelling.  Gastrointestinal: Negative for abdominal distention and abdominal pain.  Endocrine: Negative for hot flashes.  Genitourinary: Negative for difficulty urinating, dysuria and frequency.   Musculoskeletal: Negative for arthralgias.  Skin: Negative for itching and rash.  Neurological: Negative for light-headedness and numbness.  Hematological: Negative for adenopathy.  Does not bruise/bleed easily.  Psychiatric/Behavioral: Negative for confusion.    Past Medical History:  Diagnosis Date  . Allergic rhinitis   . Allergy   . Anemia    giving blood every six weeks  . Barrett's esophagus   . Chickenpox   . Colon polyps   . GERD (gastroesophageal reflux disease)   . Hyperlipidemia   . Hypertension   . Iron deficiency anemia 11/11/2018  . Migraines   . Osteoarthritis    osteoarthritis   Past Surgical History:  Procedure Laterality Date  . COLONOSCOPY    . ESOPHAGOGASTRODUODENOSCOPY ENDOSCOPY      Family History  Problem Relation Age of Onset  . Lung cancer Mother   . COPD Mother   . Lung cancer Father   . Stroke Father   . Spina bifida Sister   . Cirrhosis Brother   . Colon cancer Neg Hx   . Esophageal cancer Neg Hx   . Rectal cancer Neg Hx   . Stomach cancer Neg Hx     Social History   Socioeconomic History  . Marital status: Married    Spouse name: Not on file  . Number of children: 1  . Years of education: Not on file  . Highest education level: Not on file  Occupational History  . Not on file  Tobacco Use  . Smoking status: Former Smoker    Packs/day: 2.00    Years: 36.00    Pack years: 72.00    Quit date: 2004    Years since quitting: 16.9  . Smokeless tobacco: Never Used  Substance and Sexual Activity  . Alcohol use: Yes    Alcohol/week: 5.0 standard drinks    Types: 5 Shots of liquor per  week    Comment: occasional  . Drug use: No  . Sexual activity: Not on file  Other Topics Concern  . Not on file  Social History Narrative  . Not on file   Social Determinants of Health   Financial Resource Strain:   . Difficulty of Paying Living Expenses: Not on file  Food Insecurity:   . Worried About Charity fundraiser in the Last Year: Not on file  . Ran Out of Food in the Last Year: Not on file  Transportation Needs:   . Lack of Transportation (Medical): Not on file  . Lack of Transportation (Non-Medical): Not on  file  Physical Activity:   . Days of Exercise per Week: Not on file  . Minutes of Exercise per Session: Not on file  Stress:   . Feeling of Stress : Not on file  Social Connections:   . Frequency of Communication with Friends and Family: Not on file  . Frequency of Social Gatherings with Friends and Family: Not on file  . Attends Religious Services: Not on file  . Active Member of Clubs or Organizations: Not on file  . Attends Archivist Meetings: Not on file  . Marital Status: Not on file  Intimate Partner Violence:   . Fear of Current or Ex-Partner: Not on file  . Emotionally Abused: Not on file  . Physically Abused: Not on file  . Sexually Abused: Not on file    Current Outpatient Medications on File Prior to Visit  Medication Sig Dispense Refill  . amLODipine (NORVASC) 5 MG tablet TAKE 1 TABLET BY MOUTH EVERY DAY 90 tablet 1  . aspirin 81 MG tablet Take 81 mg by mouth daily.    Marland Kitchen atorvastatin (LIPITOR) 40 MG tablet Take 1 tablet (40 mg total) by mouth daily. 90 tablet 3  . fluticasone (FLONASE) 50 MCG/ACT nasal spray Place into both nostrils daily.    . hydrochlorothiazide (HYDRODIURIL) 25 MG tablet TAKE 1 TABLET(25 MG) BY MOUTH DAILY 90 tablet 1  . hydrocortisone (ANUSOL-HC) 25 MG suppository Place 1 suppository (25 mg total) rectally at bedtime. Use for 5-7 days. 12 suppository 0  . losartan (COZAAR) 100 MG tablet TAKE 1 TABLET(100 MG) BY MOUTH DAILY 90 tablet 1  . meclizine (ANTIVERT) 25 MG tablet Take 1 tablet (25 mg total) by mouth 3 (three) times daily as needed for dizziness. 30 tablet 0  . meloxicam (MOBIC) 7.5 MG tablet Take 1 tablet (7.5 mg total) by mouth daily as needed for pain. 30 tablet 0  . Multiple Vitamin (MULTIVITAMIN) tablet Take 1 tablet by mouth daily.    . NON FORMULARY [employer]-tec - 1 tab daily    . omeprazole (PRILOSEC) 40 MG capsule TAKE 1 CAPSULE(40 MG) BY MOUTH DAILY 90 capsule 3  . ondansetron (ZOFRAN-ODT) 4 MG disintegrating tablet  Take 4 mg by mouth every 6 (six) hours as needed.     No current facility-administered medications on file prior to visit.    Allergies  Allergen Reactions  . Molds & Smuts     Itching and sneezing        Observations/Objective: Today's Vitals   10/06/19 1019  PainSc: 0-No pain   There is no height or weight on file to calculate BMI.  Physical Exam  Constitutional: No distress.  Neurological: He is alert.  Psychiatric: Mood normal.    CBC    Component Value Date/Time   WBC 9.2 11/13/2018 1136   WBC 7.3 01/18/2018  0758   RBC 4.11 (L) 11/13/2018 1136   RBC 4.77 01/18/2018 0758   HGB 10.6 (L) 11/13/2018 1136   HCT 33.3 (L) 11/13/2018 1136   PLT 378 11/13/2018 1136   MCV 81 11/13/2018 1136   MCV 82 12/05/2011 0820   MCH 25.8 (L) 11/13/2018 1136   MCH 28.0 05/22/2017 0846   MCHC 31.8 11/13/2018 1136   MCHC 33.6 01/18/2018 0758   RDW 17.7 (H) 11/13/2018 1136   RDW 16.1 (H) 12/05/2011 0820   LYMPHSABS 2.5 11/13/2018 1136   LYMPHSABS 2.9 12/05/2011 0820   MONOABS 0.5 05/22/2017 0846   MONOABS 0.6 12/05/2011 0820   EOSABS 0.8 (H) 11/13/2018 1136   EOSABS 1.2 (H) 12/05/2011 0820   BASOSABS 0.1 11/13/2018 1136   BASOSABS 0.1 12/05/2011 0820    CMP     Component Value Date/Time   NA 142 08/20/2019 0708   NA 142 12/05/2011 0820   K 4.0 08/20/2019 0708   K 4.0 12/05/2011 0820   CL 103 08/20/2019 0708   CL 107 12/05/2011 0820   CO2 24 08/20/2019 0708   CO2 28 12/05/2011 0820   GLUCOSE 138 (H) 08/20/2019 0708   GLUCOSE 120 (H) 01/18/2018 0758   GLUCOSE 81 12/05/2011 0820   BUN 16 08/20/2019 0708   BUN 10 12/05/2011 0820   CREATININE 0.96 08/20/2019 0708   CREATININE 0.92 12/05/2011 0820   CALCIUM 9.7 08/20/2019 0708   CALCIUM 9.5 12/05/2011 0820   PROT 6.9 08/22/2019 0707   PROT 7.7 12/05/2011 0820   ALBUMIN 4.6 08/22/2019 0707   ALBUMIN 4.4 12/05/2011 0820   AST 31 08/22/2019 0707   AST 30 12/05/2011 0820   ALT 57 (H) 08/22/2019 0707   ALT 40 12/05/2011  0820   ALKPHOS 72 08/22/2019 0707   ALKPHOS 57 12/05/2011 0820   BILITOT 0.6 08/22/2019 0707   BILITOT 0.6 12/05/2011 0820   GFRNONAA 84 08/20/2019 0708   GFRNONAA >60 12/05/2011 0820   GFRAA 98 08/20/2019 0708   GFRAA >60 12/05/2011 0820     Assessment and Plan: 1. Iron deficiency anemia due to chronic blood loss   2. Eosinophilic leukocytosis, unspecified type     Labs are reviewed and discussed with patient.  Patient has stable and normalized hemoglobin.  Hemoglobin 14.2. Patient has been off iron treatment and hemoglobin has been stable. Discussed with patient that he can continue to be off iron supplementation.  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  He denies any constitutional symptoms.  Labs are reviewed and discussed with patient.  Eosinophilia most likely due to allergy.   Follow Up Instructions: Patient will be discharged from our clinic.  He can call in the future if develops new symptoms or concerns.   I discussed the assessment and treatment plan with the patient. The patient was provided an opportunity to ask questions and all were answered. The patient agreed with the plan and demonstrated an understanding of the instructions.  The patient was advised to call back or seek an in-person evaluation if the symptoms worsen or if the condition fails to improve as anticipated.    Earlie Server, MD 10/06/2019 8:12 PM

## 2019-10-19 ENCOUNTER — Other Ambulatory Visit: Payer: Self-pay | Admitting: Family Medicine

## 2019-11-08 LAB — HEPATIC FUNCTION PANEL
ALT: 76 IU/L — ABNORMAL HIGH (ref 0–44)
AST: 52 IU/L — ABNORMAL HIGH (ref 0–40)
Albumin: 4.5 g/dL (ref 3.8–4.8)
Alkaline Phosphatase: 66 IU/L (ref 39–117)
Bilirubin Total: 0.5 mg/dL (ref 0.0–1.2)
Bilirubin, Direct: 0.15 mg/dL (ref 0.00–0.40)
Total Protein: 7 g/dL (ref 6.0–8.5)

## 2019-11-10 ENCOUNTER — Encounter: Payer: Self-pay | Admitting: Family Medicine

## 2019-11-10 DIAGNOSIS — E785 Hyperlipidemia, unspecified: Secondary | ICD-10-CM

## 2019-11-10 DIAGNOSIS — R7989 Other specified abnormal findings of blood chemistry: Secondary | ICD-10-CM

## 2019-11-10 DIAGNOSIS — R945 Abnormal results of liver function studies: Secondary | ICD-10-CM

## 2019-12-03 ENCOUNTER — Encounter: Payer: Self-pay | Admitting: Family Medicine

## 2019-12-03 LAB — LIPID PANEL
Chol/HDL Ratio: 6 ratio — ABNORMAL HIGH (ref 0.0–5.0)
Cholesterol, Total: 263 mg/dL — ABNORMAL HIGH (ref 100–199)
HDL: 44 mg/dL (ref >39)
LDL Chol Calc (NIH): 115 mg/dL — ABNORMAL HIGH (ref 0–99)
Triglycerides: 588 mg/dL (ref 0–149)
VLDL Cholesterol Cal: 104 mg/dL — ABNORMAL HIGH (ref 5–40)

## 2019-12-03 LAB — HEPATIC FUNCTION PANEL
ALT: 73 IU/L — ABNORMAL HIGH (ref 0–44)
AST: 55 IU/L — ABNORMAL HIGH (ref 0–40)
Albumin: 4.6 g/dL (ref 3.8–4.8)
Alkaline Phosphatase: 72 IU/L (ref 39–117)
Bilirubin Total: 0.4 mg/dL (ref 0.0–1.2)
Bilirubin, Direct: 0.15 mg/dL (ref 0.00–0.40)
Total Protein: 7.2 g/dL (ref 6.0–8.5)

## 2019-12-04 ENCOUNTER — Other Ambulatory Visit: Payer: Self-pay | Admitting: Family Medicine

## 2019-12-04 DIAGNOSIS — R945 Abnormal results of liver function studies: Secondary | ICD-10-CM

## 2019-12-04 DIAGNOSIS — E782 Mixed hyperlipidemia: Secondary | ICD-10-CM

## 2019-12-04 DIAGNOSIS — R7989 Other specified abnormal findings of blood chemistry: Secondary | ICD-10-CM

## 2019-12-05 ENCOUNTER — Encounter: Payer: Self-pay | Admitting: Family Medicine

## 2019-12-05 ENCOUNTER — Ambulatory Visit (INDEPENDENT_AMBULATORY_CARE_PROVIDER_SITE_OTHER): Payer: Managed Care, Other (non HMO) | Admitting: Family Medicine

## 2019-12-05 ENCOUNTER — Other Ambulatory Visit: Payer: Self-pay

## 2019-12-05 DIAGNOSIS — R7989 Other specified abnormal findings of blood chemistry: Secondary | ICD-10-CM

## 2019-12-05 DIAGNOSIS — M25521 Pain in right elbow: Secondary | ICD-10-CM | POA: Diagnosis not present

## 2019-12-05 DIAGNOSIS — E785 Hyperlipidemia, unspecified: Secondary | ICD-10-CM

## 2019-12-05 DIAGNOSIS — I10 Essential (primary) hypertension: Secondary | ICD-10-CM

## 2019-12-05 NOTE — Assessment & Plan Note (Signed)
Possible tennis elbow.  Discussed icing and3 days of anti-inflammatories over-the-counter such as Aleve or ibuprofen.  If not improving he will let us know.

## 2019-12-05 NOTE — Assessment & Plan Note (Signed)
Potentially fatty liver related though his prior alcohol intake could have played a role as well.  Encouraged him to continue to decrease his alcohol intake.  Referral already placed to GI.

## 2019-12-05 NOTE — Assessment & Plan Note (Signed)
Continue Lipitor.  Check lipid panel in 4 weeks.  Orders placed.

## 2019-12-05 NOTE — Assessment & Plan Note (Addendum)
Adequate control.  Continue current regimen.  I encouraged him to start checking his BP several times a week.  If it starts to elevate he needs to let us know.

## 2019-12-05 NOTE — Progress Notes (Signed)
Virtual Visit via video Note  This visit type was conducted due to national recommendations for restrictions regarding the COVID-19 pandemic (e.g. social distancing).  This format is felt to be most appropriate for this patient at this time.  All issues noted in this document were discussed and addressed.  No physical exam was performed (except for noted visual exam findings with Video Visits).   I connected with Collin Mills today at  1:45 PM EST by a video enabled telemedicine application or telephone and verified that I am speaking with the correct person using two identifiers. Location patient: home Location provider: work  Persons participating in the virtual visit: patient, provider  I discussed the limitations, risks, security and privacy concerns of performing an evaluation and management service by telephone and the availability of in person appointments. I also discussed with the patient that there may be a patient responsible charge related to this service. The patient expressed understanding and agreed to proceed.  Reason for visit: follow-up  HPI: Right elbow pain: Patient notes this has been going on about 3 weeks.  It is just proximal to the lateral epicondyle.  He notes if he squeezes anything or stretches his elbow it hurts.  No injury.  It aches at baseline.  LFT elevation: Has remained elevated.  He tried stopping his Lipitor for 3 weeks though his labs were unchanged.  He does have 3 alcoholic beverages on Friday and Saturday nights.  He notes last year he was drinking more than this.  No other alcohol during the week.  No Tylenol use.  No right upper quadrant pain.  Hyperlipidemia: He restarted Lipitor.  No chest pain, right upper quadrant pain, or myalgias.  Hypertension: Typically 130s over 80s.  Taking losartan, amlodipine, and HCTZ.   ROS: See pertinent positives and negatives per HPI.  Past Medical History:  Diagnosis Date  . Allergic rhinitis   . Allergy    . Anemia    giving blood every six weeks  . Barrett's esophagus   . Chickenpox   . Colon polyps   . GERD (gastroesophageal reflux disease)   . Hyperlipidemia   . Hypertension   . Iron deficiency anemia 11/11/2018  . Migraines   . Osteoarthritis    osteoarthritis    Past Surgical History:  Procedure Laterality Date  . COLONOSCOPY    . ESOPHAGOGASTRODUODENOSCOPY ENDOSCOPY      Family History  Problem Relation Age of Onset  . Lung cancer Mother   . COPD Mother   . Lung cancer Father   . Stroke Father   . Spina bifida Sister   . Cirrhosis Brother   . Colon cancer Neg Hx   . Esophageal cancer Neg Hx   . Rectal cancer Neg Hx   . Stomach cancer Neg Hx     SOCIAL HX: Former smoker   Current Outpatient Medications:  .  amLODipine (NORVASC) 5 MG tablet, TAKE 1 TABLET BY MOUTH EVERY DAY, Disp: 90 tablet, Rfl: 1 .  aspirin 81 MG tablet, Take 81 mg by mouth daily., Disp: , Rfl:  .  atorvastatin (LIPITOR) 40 MG tablet, Take 1 tablet (40 mg total) by mouth daily., Disp: 90 tablet, Rfl: 3 .  fluticasone (FLONASE) 50 MCG/ACT nasal spray, Place into both nostrils daily., Disp: , Rfl:  .  hydrochlorothiazide (HYDRODIURIL) 25 MG tablet, TAKE 1 TABLET(25 MG) BY MOUTH DAILY, Disp: 90 tablet, Rfl: 1 .  losartan (COZAAR) 100 MG tablet, TAKE 1 TABLET(100 MG) BY MOUTH DAILY,  Disp: 90 tablet, Rfl: 1 .  Multiple Vitamin (MULTIVITAMIN) tablet, Take 1 tablet by mouth daily., Disp: , Rfl:  .  NON FORMULARY, [employer]-tec - 1 tab daily, Disp: , Rfl:  .  omeprazole (PRILOSEC) 40 MG capsule, TAKE 1 CAPSULE(40 MG) BY MOUTH DAILY, Disp: 90 capsule, Rfl: 3  EXAM:  VITALS per patient if applicable:  GENERAL: alert, oriented, appears well and in no acute distress  HEENT: atraumatic, conjunttiva clear, no obvious abnormalities on inspection of external nose and ears  NECK: normal movements of the head and neck  LUNGS: on inspection no signs of respiratory distress, breathing rate appears  normal, no obvious gross SOB, gasping or wheezing  CV: no obvious cyanosis  MS: moves all visible extremities without noticeable abnormality  PSYCH/NEURO: pleasant and cooperative, no obvious depression or anxiety, speech and thought processing grossly intact  ASSESSMENT AND PLAN:  Discussed the following assessment and plan:  Essential hypertension Adequate control.  Continue current regimen.  I encouraged him to start checking his BP several times a week.  If it starts to elevate he needs to let us know.  Right elbow pain Possible tennis elbow.  Discussed icing and3 days of anti-inflammatories over-the-counter such as Aleve or ibuprofen.  If not improving he will let us know.  Hyperlipidemia Continue Lipitor.  Check lipid panel in 4 weeks.  Orders placed.  Elevated LFTs Potentially fatty liver related though his prior alcohol intake could have played a role as well.  Encouraged him to continue to decrease his alcohol intake.  Referral already placed to GI.   Orders Placed This Encounter  Procedures  . Lipid panel  . Hepatic function panel    No orders of the defined types were placed in this encounter.    I discussed the assessment and treatment plan with the patient. The patient was provided an opportunity to ask questions and all were answered. The patient agreed with the plan and demonstrated an understanding of the instructions.   The patient was advised to call back or seek an in-person evaluation if the symptoms worsen or if the condition fails to improve as anticipated.   [name], MD

## 2019-12-16 ENCOUNTER — Encounter: Payer: Self-pay | Admitting: Family Medicine

## 2019-12-16 DIAGNOSIS — M25521 Pain in right elbow: Secondary | ICD-10-CM

## 2019-12-24 DIAGNOSIS — M25521 Pain in right elbow: Secondary | ICD-10-CM | POA: Insufficient documentation

## 2020-01-13 ENCOUNTER — Encounter: Payer: Self-pay | Admitting: Gastroenterology

## 2020-01-13 ENCOUNTER — Ambulatory Visit: Payer: Managed Care, Other (non HMO) | Admitting: Gastroenterology

## 2020-01-13 ENCOUNTER — Other Ambulatory Visit (INDEPENDENT_AMBULATORY_CARE_PROVIDER_SITE_OTHER): Payer: Managed Care, Other (non HMO)

## 2020-01-13 ENCOUNTER — Other Ambulatory Visit: Payer: Self-pay

## 2020-01-13 VITALS — BP 130/70 | HR 72 | Temp 98.4°F | Ht 68.0 in | Wt 237.4 lb

## 2020-01-13 DIAGNOSIS — K7581 Nonalcoholic steatohepatitis (NASH): Secondary | ICD-10-CM

## 2020-01-13 DIAGNOSIS — D509 Iron deficiency anemia, unspecified: Secondary | ICD-10-CM

## 2020-01-13 DIAGNOSIS — R945 Abnormal results of liver function studies: Secondary | ICD-10-CM

## 2020-01-13 DIAGNOSIS — K760 Fatty (change of) liver, not elsewhere classified: Secondary | ICD-10-CM | POA: Diagnosis not present

## 2020-01-13 DIAGNOSIS — R7989 Other specified abnormal findings of blood chemistry: Secondary | ICD-10-CM

## 2020-01-13 DIAGNOSIS — D500 Iron deficiency anemia secondary to blood loss (chronic): Secondary | ICD-10-CM

## 2020-01-13 DIAGNOSIS — K5520 Angiodysplasia of colon without hemorrhage: Secondary | ICD-10-CM

## 2020-01-13 LAB — COMPREHENSIVE METABOLIC PANEL
ALT: 50 U/L (ref 0–53)
AST: 33 U/L (ref 0–37)
Albumin: 4.4 g/dL (ref 3.5–5.2)
Alkaline Phosphatase: 64 U/L (ref 39–117)
BUN: 15 mg/dL (ref 6–23)
CO2: 30 mEq/L (ref 19–32)
Calcium: 9.5 mg/dL (ref 8.4–10.5)
Chloride: 102 mEq/L (ref 96–112)
Creatinine, Ser: 1.12 mg/dL (ref 0.40–1.50)
GFR: 66.29 mL/min (ref >60.00)
Glucose, Bld: 114 mg/dL — ABNORMAL HIGH (ref 70–99)
Potassium: 3.6 mEq/L (ref 3.5–5.1)
Sodium: 140 mEq/L (ref 135–145)
Total Bilirubin: 0.5 mg/dL (ref 0.2–1.2)
Total Protein: 7.1 g/dL (ref 6.0–8.3)

## 2020-01-13 LAB — CBC WITH DIFFERENTIAL/PLATELET
Basophils Absolute: 0.1 10*3/uL (ref 0.0–0.1)
Basophils Relative: 0.9 % (ref 0.0–3.0)
Eosinophils Absolute: 0.6 10*3/uL (ref 0.0–0.7)
Eosinophils Relative: 6.3 % — ABNORMAL HIGH (ref 0.0–5.0)
HCT: 38.3 % — ABNORMAL LOW (ref 39.0–52.0)
Hemoglobin: 13.3 g/dL (ref 13.0–17.0)
Lymphocytes Relative: 22.1 % (ref 12.0–46.0)
Lymphs Abs: 2 10*3/uL (ref 0.7–4.0)
MCHC: 34.8 g/dL (ref 30.0–36.0)
MCV: 89.9 fl (ref 78.0–100.0)
Monocytes Absolute: 0.7 10*3/uL (ref 0.1–1.0)
Monocytes Relative: 8 % (ref 3.0–12.0)
Neutro Abs: 5.7 10*3/uL (ref 1.4–7.7)
Neutrophils Relative %: 62.7 % (ref 43.0–77.0)
Platelets: 212 10*3/uL (ref 150.0–400.0)
RBC: 4.26 Mil/uL (ref 4.22–5.81)
RDW: 13.9 % (ref 11.5–15.5)
WBC: 9.1 10*3/uL (ref 4.0–10.5)

## 2020-01-13 LAB — IBC + FERRITIN
Ferritin: 203.2 ng/mL (ref 22.0–322.0)
Iron: 67 ug/dL (ref 42–165)
Saturation Ratios: 19.4 % — ABNORMAL LOW (ref 20.0–50.0)
Transferrin: 247 mg/dL (ref 212.0–360.0)

## 2020-01-13 LAB — TRIGLYCERIDES: Triglycerides: 355 mg/dL — ABNORMAL HIGH (ref 0.0–149.0)

## 2020-01-13 MED ORDER — HYDROCORTISONE ACETATE 25 MG RE SUPP: 25.0000 mg | Freq: Every day | RECTAL | 3 refills | Status: DC

## 2020-01-13 NOTE — Patient Instructions (Signed)
Start Oral Iron - over the counter.   Please follow low carb, low fat diet.   Avoid ETOH.   Your provider has requested that you go to the basement level for lab work before leaving today. Press "B" on the elevator. The lab is located at the first door on the left as you exit the elevator.  You have been scheduled for an abdominal ultrasound at [facility] (1st floor of hospital) on 01/19/20 at 9:00am. Please arrive 15 minutes prior to your appointment for registration. Make certain not to have anything to eat or drink 6 hours prior to your appointment. Should you need to reschedule your appointment, please contact radiology at [phone]. This test typically takes about 30 minutes to perform.   If you are age 25 or younger, your body mass index should be between 19-25. Your Body mass index is 36.1 kg/m. If this is out of the aformentioned range listed, please consider follow up with your Primary Care Provider.   Please follow-up in 3-4 months with Dr.Nandigam   Thank you for choosing me and [facility].  Dr. Silverio Decamp

## 2020-01-13 NOTE — Progress Notes (Signed)
Collin Mills    [id number]    07/11/1956  Primary Care Physician:Sonnenberg, Angela Adam, MD  Referring Physician: Leone Haven, MD 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 No new medication Denies any nausea, vomiting, abdominal pain, melena or bright red blood per rectum   Iron/TIBC/Ferritin/ %Sat    Component Value Date/Time   IRON 21 (L) 11/04/2018 0727   TIBC 381 11/04/2018 0727   FERRITIN 16 (L) 11/04/2018 0727   IRONPCTSAT 6 (LL) 11/04/2018 0727    EGD November 22, 2018 showed long segment Barrett's esophagus, hiatal hernia otherwise unremarkable exam.  Biopsy showed Barrett's esophagus with no dysplasia. Colonoscopy November 22, 2018 with removal of 4 tubular adenomas and 1 hyperplastic polyp.  Internal hemorrhoids Capsule endoscopy December 19, 2018 showed few small nonbleeding AVM and small intestine and one small erosion.  No active bleeding.  Outpatient Encounter Medications as of 01/13/2020  Medication Sig  . amLODipine (NORVASC) 5 MG tablet TAKE 1 TABLET BY MOUTH EVERY DAY  . aspirin 81 MG tablet Take 81 mg by mouth daily.  Marland Kitchen atorvastatin (LIPITOR) 40 MG tablet Take 1 tablet (40 mg total) by mouth daily.  . fluticasone (FLONASE) 50 MCG/ACT nasal spray Place into both nostrils daily.  .  hydrochlorothiazide (HYDRODIURIL) 25 MG tablet TAKE 1 TABLET(25 MG) BY MOUTH DAILY  . losartan (COZAAR) 100 MG tablet TAKE 1 TABLET(100 MG) BY MOUTH DAILY  . Multiple Vitamin (MULTIVITAMIN) tablet Take 1 tablet by mouth daily.  . NON FORMULARY [facility]-tec - 1 tab daily  . omeprazole (PRILOSEC) 40 MG capsule TAKE 1 CAPSULE(40 MG) BY MOUTH DAILY   No facility-administered encounter medications on file as of 01/13/2020.    Allergies as of 01/13/2020 - Review Complete 12/05/2019  Allergen Reaction Noted  . Molds & smuts  07/15/2018    Past Medical History:  Diagnosis Date  . Allergic rhinitis   . Allergy   . Anemia    giving blood every six weeks  . Barrett's esophagus   . Chickenpox   . Colon polyps   . GERD (gastroesophageal reflux disease)   . Hyperlipidemia   . Hypertension   . Iron deficiency anemia 11/11/2018  . Migraines   . Osteoarthritis    osteoarthritis    Past Surgical History:  Procedure Laterality Date  . COLONOSCOPY    . ESOPHAGOGASTRODUODENOSCOPY ENDOSCOPY      Family History  Problem Relation Age of Onset  . Lung cancer Mother   . COPD Mother   . Lung cancer Father   . Stroke Father   . Spina bifida Sister   . Cirrhosis Brother   . Colon cancer Neg Hx   . Esophageal cancer Neg Hx   .  Rectal cancer Neg Hx   . Stomach cancer Neg Hx     Social History   Socioeconomic History  . Marital status: Married    Spouse name: Not on file  . Number of children: 1  . Years of education: Not on file  . Highest education level: Not on file  Occupational History  . Not on file  Tobacco Use  . Smoking status: Former Smoker    Packs/day: 2.00    Years: 36.00    Pack years: 72.00    Quit date: 2004    Years since quitting: 17.2  . Smokeless tobacco: Never Used  Substance and Sexual Activity  . Alcohol use: Yes    Alcohol/week: 5.0 standard drinks    Types: 5 Shots of liquor per week    Comment: occasional  . Drug use: No  . Sexual activity:  Not on file  Other Topics Concern  . Not on file  Social History Narrative  . Not on file   Social Determinants of Health   Financial Resource Strain:   . Difficulty of Paying Living Expenses:   Food Insecurity:   . Worried About Charity fundraiser in the Last Year:   . Arboriculturist in the Last Year:   Transportation Needs:   . Film/video editor (Medical):   Marland Kitchen Lack of Transportation (Non-Medical):   Physical Activity:   . Days of Exercise per Week:   . Minutes of Exercise per Session:   Stress:   . Feeling of Stress :   Social Connections:   . Frequency of Communication with Friends and Family:   . Frequency of Social Gatherings with Friends and Family:   . Attends Religious Services:   . Active Member of Clubs or Organizations:   . Attends Archivist Meetings:   Marland Kitchen Marital Status:   Intimate Partner Violence:   . Fear of Current or Ex-Partner:   . Emotionally Abused:   Marland Kitchen Physically Abused:   . Sexually Abused:       Review of systems: All other review of systems negative except as mentioned in the HPI.   Physical Exam: Vitals:   01/13/20 1550  BP: 130/70  Pulse: 72  Temp: 98.4 F (36.9 C)   Body mass index is 36.1 kg/m. Gen:      No acute distress Abd:      + bowel sounds; soft, non-tender; no palpable masses, no distension Neuro: alert and oriented x 3 Psych: normal mood and affect  Data Reviewed:  Reviewed labs, radiology imaging, old records and pertinent past GI work up   Assessment and Plan/Recommendations:  64 year old male with history of iron deficiency anemia secondary to small volume of occult GI blood loss due to small bowel AVMs Follow-up CBC and iron panel Advised patient to continue daily oral iron supplements Will consider IV iron infusion if has severe iron deficiency  We will hold off endoscopic evaluation unless he will need therapeutic intervention in the setting of overt GI bleeding  Transaminitis: Likely  secondary to fatty liver/steatohepatitis Elevated triglycerides Advised patient to modify diet and to adhere with low-carb and low-fat diet Avoid alcohol, NSAIDs and over-the-counter herbal supplements Follow-up LFT  Check right upper quadrant ultrasound with FibroScan to exclude any fibrosis  Return in 3 months or sooner if needed   The patient was provided an opportunity to ask questions and all were answered. The patient agreed with the plan and demonstrated an understanding of the  instructions.  Damaris Hippo , MD    CC: Leone Haven, MD

## 2020-01-14 LAB — HEPATITIS B SURFACE ANTIBODY,QUALITATIVE: Hep B S Ab: NONREACTIVE

## 2020-01-14 LAB — HEPATITIS B SURFACE ANTIGEN: Hepatitis B Surface Ag: NONREACTIVE

## 2020-01-14 LAB — HEPATITIS A ANTIBODY, TOTAL: Hepatitis A AB,Total: NONREACTIVE

## 2020-01-15 ENCOUNTER — Encounter: Payer: Self-pay | Admitting: Gastroenterology

## 2020-01-19 ENCOUNTER — Other Ambulatory Visit: Payer: Self-pay

## 2020-01-19 ENCOUNTER — Ambulatory Visit (HOSPITAL_COMMUNITY): Payer: Managed Care, Other (non HMO)

## 2020-01-19 ENCOUNTER — Ambulatory Visit
Admission: RE | Admit: 2020-01-19 | Discharge: 2020-01-19 | Disposition: A | Discharge status: Home / Self Care | Payer: Managed Care, Other (non HMO) | Source: Ambulatory Visit | Attending: Gastroenterology | Admitting: Gastroenterology

## 2020-01-19 DIAGNOSIS — D500 Iron deficiency anemia secondary to blood loss (chronic): Secondary | ICD-10-CM | POA: Diagnosis present

## 2020-01-19 DIAGNOSIS — R945 Abnormal results of liver function studies: Secondary | ICD-10-CM | POA: Diagnosis present

## 2020-01-19 DIAGNOSIS — R7989 Other specified abnormal findings of blood chemistry: Secondary | ICD-10-CM

## 2020-02-04 ENCOUNTER — Other Ambulatory Visit: Payer: Self-pay | Admitting: Family Medicine

## 2020-02-23 ENCOUNTER — Encounter: Payer: Self-pay | Admitting: Family Medicine

## 2020-02-23 DIAGNOSIS — J309 Allergic rhinitis, unspecified: Secondary | ICD-10-CM

## 2020-02-24 NOTE — Telephone Encounter (Signed)
The patient needs the hepatitis A and B vaccines. Please get him scheduled for this. Please also see if he has gotten the COVID19 vaccine. Thanks.

## 2020-03-04 DIAGNOSIS — M7711 Lateral epicondylitis, right elbow: Secondary | ICD-10-CM | POA: Insufficient documentation

## 2020-03-05 ENCOUNTER — Other Ambulatory Visit: Payer: Self-pay | Admitting: Family Medicine

## 2020-03-05 DIAGNOSIS — I10 Essential (primary) hypertension: Secondary | ICD-10-CM

## 2020-03-11 ENCOUNTER — Ambulatory Visit (INDEPENDENT_AMBULATORY_CARE_PROVIDER_SITE_OTHER): Payer: Managed Care, Other (non HMO)

## 2020-03-11 ENCOUNTER — Other Ambulatory Visit: Payer: Self-pay

## 2020-03-11 DIAGNOSIS — Z23 Encounter for immunization: Secondary | ICD-10-CM | POA: Diagnosis not present

## 2020-03-11 NOTE — Progress Notes (Addendum)
Patient presented for twinrix injection to left deltoid, patient voiced no concerns nor showed any signs of distress during injection.  Reviewed.  Dr Nicki Reaper

## 2020-04-02 ENCOUNTER — Other Ambulatory Visit: Payer: Self-pay | Admitting: Family Medicine

## 2020-04-15 IMAGING — US US ABDOMEN COMPLETE W/ ELASTOGRAPHY
1 series · 16 of 17 positions shown · non-contrast
Comparison: None

CLINICAL DATA: Abnormal LFTs, iron deficiency anemia

EXAM:
ULTRASOUND ABDOMEN
ULTRASOUND HEPATIC ELASTOGRAPHY
TECHNIQUE: Sonography of the upper abdomen was performed. In addition,
ultrasound elastography evaluation of the liver was performed. A
region of interest was placed within the right lobe of the liver.
Following application of a compressive sonographic pulse, tissue
compressibility was assessed. Multiple assessments were performed at
the selected site. Median tissue compressibility was determined.
Previously, hepatic stiffness was assessed by shear wave velocity.
Based on recently published Society of Radiologists in Ultrasound
consensus article, reporting is now recommended to be performed in
the SI units of pressure (kiloPascals) representing hepatic
stiffness/elasticity. The obtained result is compared to the
published reference standards. (cACLD = compensated Advanced Chronic
Liver Disease)

[Series 1: us abdomen complete w/elastography · 16 of 17 slices shown]
[im 1/17]
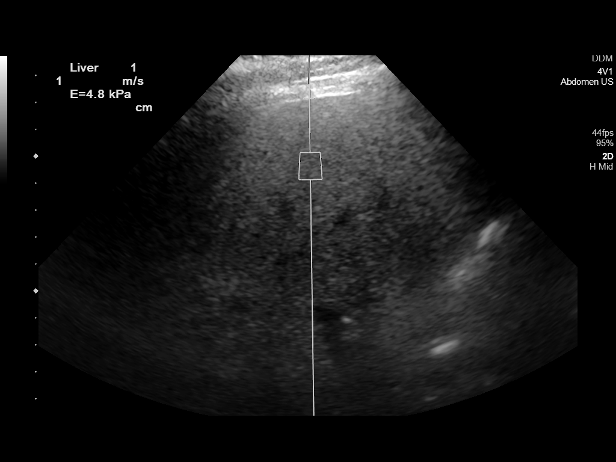
[im 2/17]
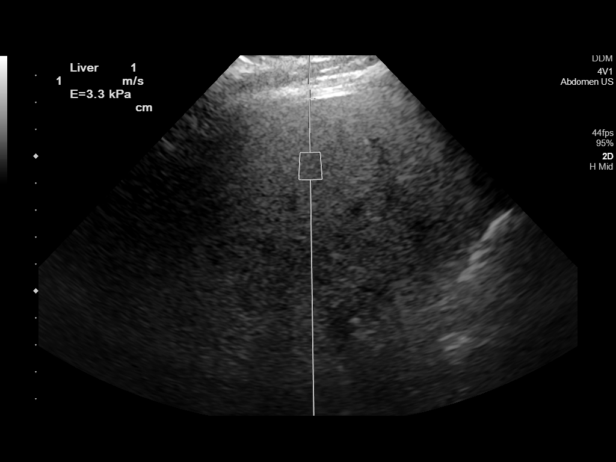
[im 3/17]
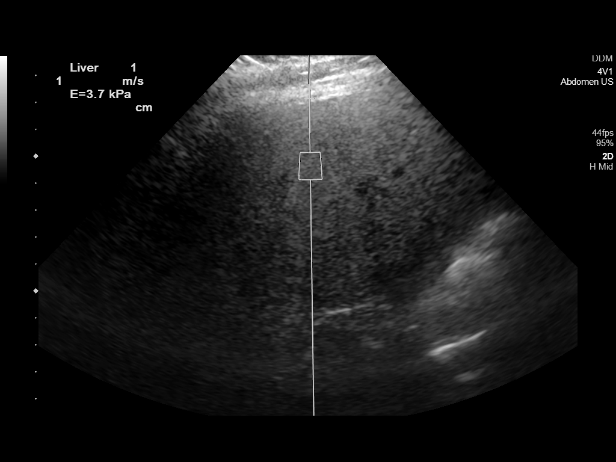
[im 4/17]
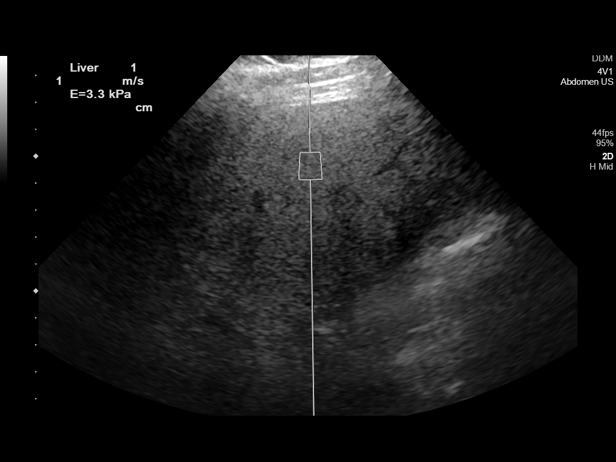
[im 5/17]
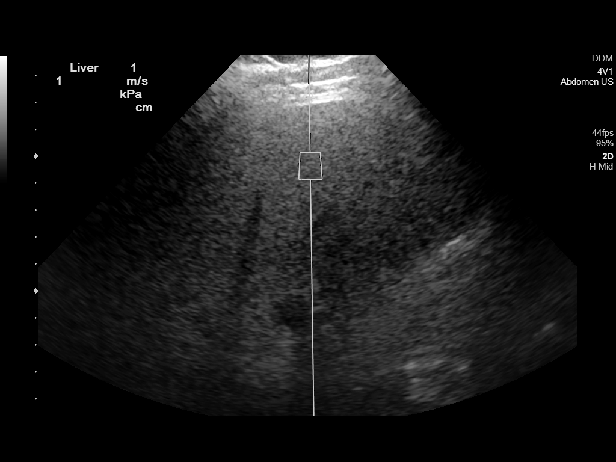
[im 6/17]
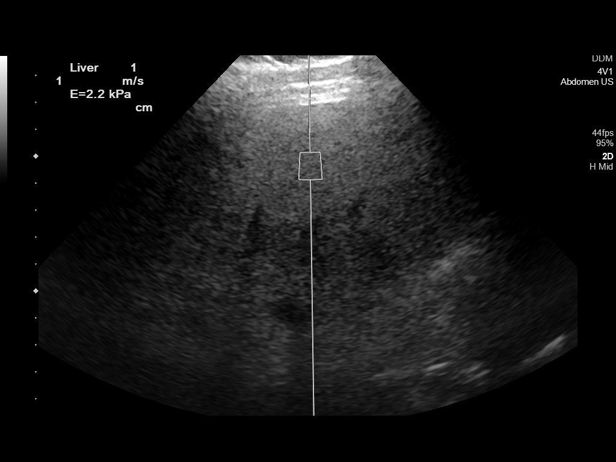
[im 7/17]
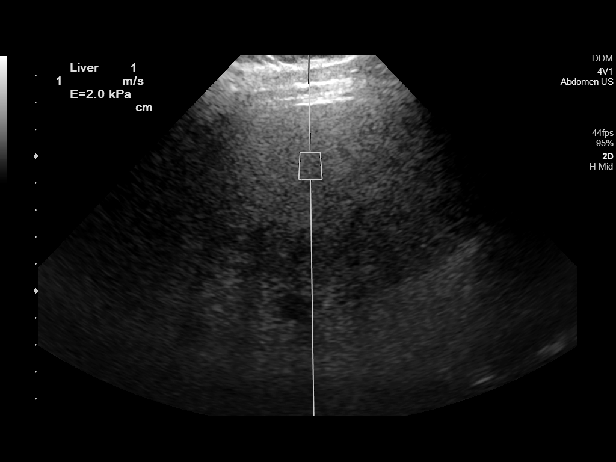
[im 8/17]
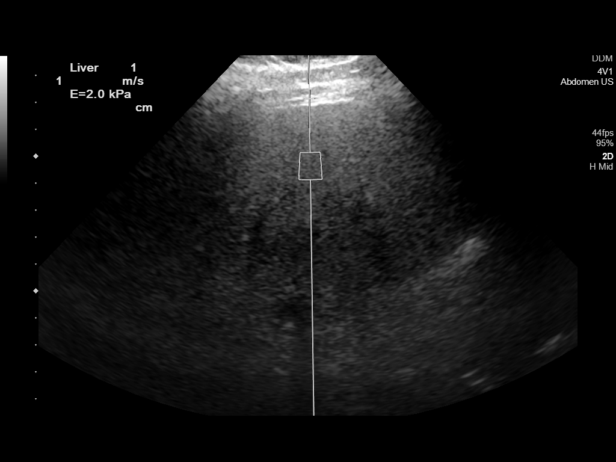
[im 10/17]
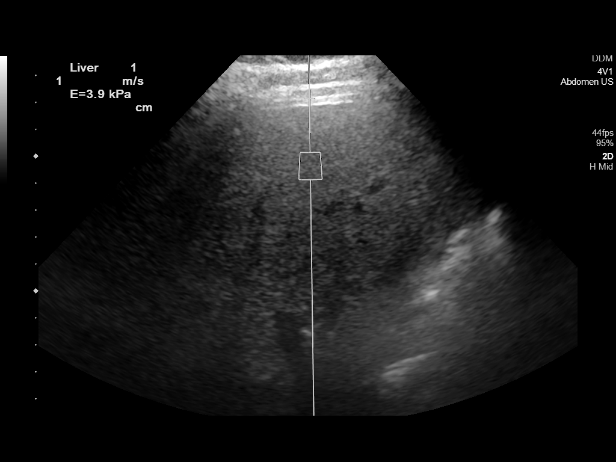
[im 11/17]
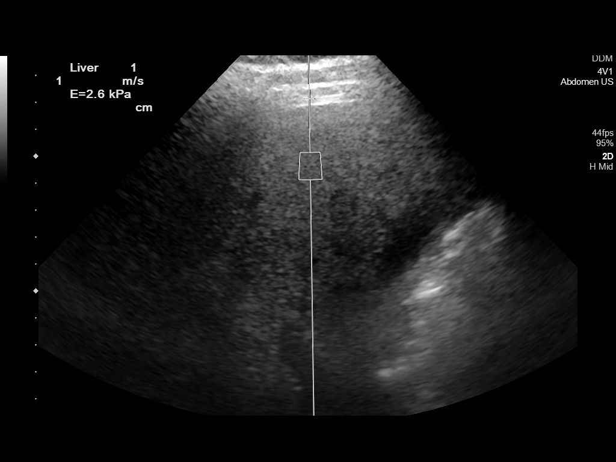
[im 12/17]
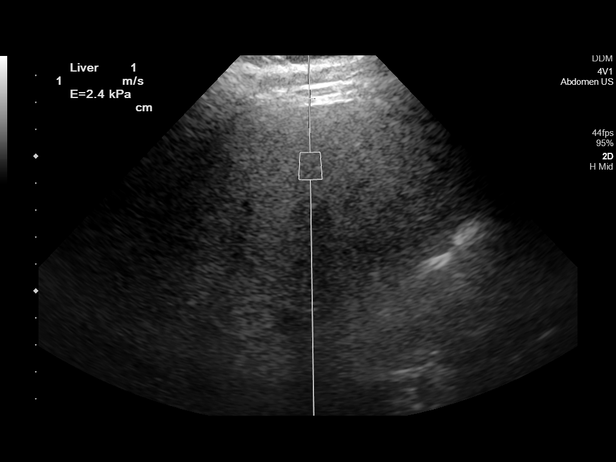
[im 13/17]
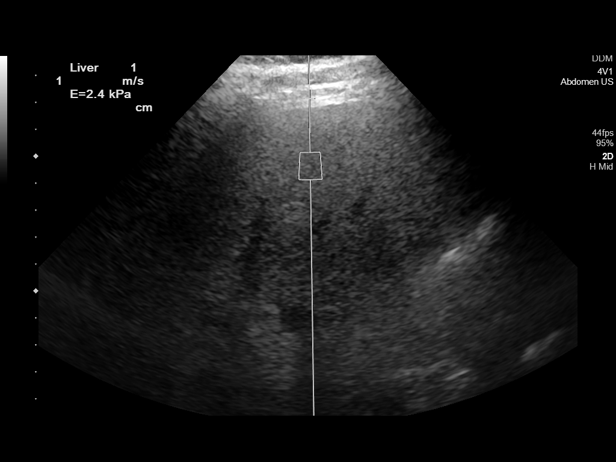
[im 14/17]
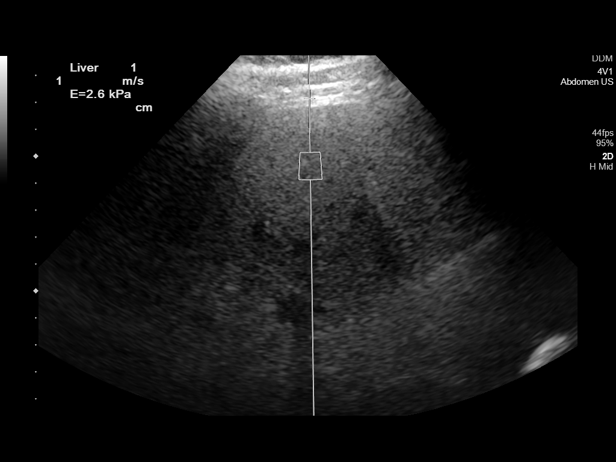
[im 15/17]
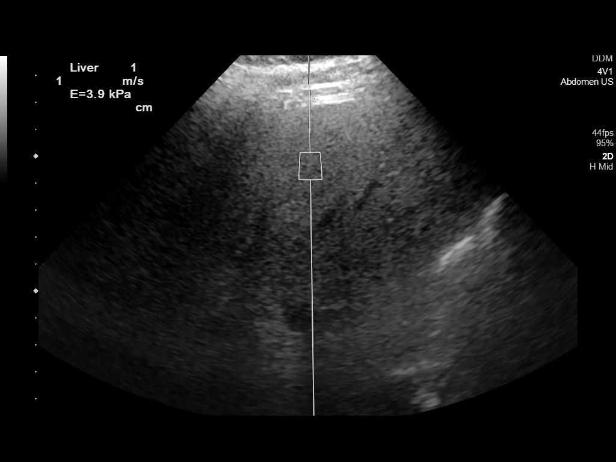
[im 16/17]
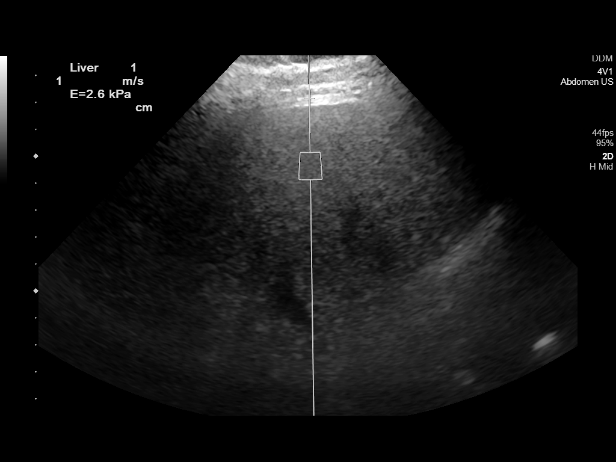
[im 17/17]
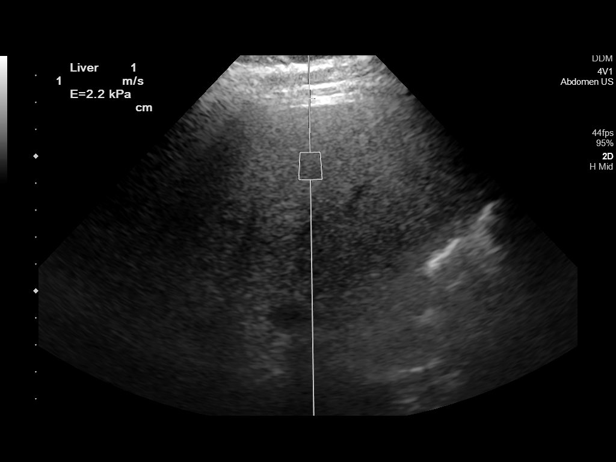

[16 of 17 positions shown; findings below may reference images not displayed]

FINDINGS: ULTRASOUND ABDOMEN

Gallbladder: Normally distended without stones or wall thickening.
No pericholecystic fluid or sonographic Murphy sign.

Common bile duct: Diameter: Inadequately visualized

Liver: Echogenic parenchyma, likely fatty infiltration though this
can be seen with cirrhosis and certain infiltrative disorders. No
focal hepatic mass or nodularity identified though assessment of
intrahepatic detail is severely limited secondary to sound
attenuation. Portal vein is patent on color Doppler imaging with
normal direction of blood flow towards the liver.

IVC: Intrahepatic portion visualized. Remainder obscured by bowel
gas

Pancreas: Pancreatic body normal appearance, remainder obscured by
bowel gas.

Spleen: Normal appearance, 6.6 cm length

Right Kidney: Length: 12.5 cm. Normal cortical thickness and
echogenicity. No mass or hydronephrosis.

Left Kidney: Length: 13.2 cm. Complicated cyst with internal
echogenicity, septations and question mural nodularity at inferior
pole 4.9 x 2.7 x 2.4 cm. Cystic renal neoplasm not excluded. No
hydronephrosis.

Abdominal aorta: Segment of fusiform dilatation mid aorta, 2.7 x
cm in axial dimensions, 3.0 cm length. Proximal aorta 2.5 cm
diameter.

Other findings: No free fluid

ULTRASOUND HEPATIC ELASTOGRAPHY

Device: Siemens Helix VTQ

Patient position: Supine

Transducer 4V1

Number of measurements: 10

Hepatic segment:  8

Median kPa:

IQR:

IQR/Median kPa ratio:

Data quality:  Good

Diagnostic category:  < or = 5 kPa: high probability of being normal
IMPRESSION: ULTRASOUND ABDOMEN:

Probable fatty infiltration of liver.

Nonvisualization of significant portions of the pancreas, portions
of IVC, and CBD.

Complicated cyst at inferior pole of LEFT kidney 4.9 cm greatest
size; follow-up characterization by MR imaging recommended to
exclude cystic renal neoplasm.

Mid abdominal aorta demonstrates mild fusiform dilatation 2.7 x
cm in size though this is less than 1.5 times the diameter of the
proximal aorta; no specific follow up recommended.

ULTRASOUND HEPATIC ELASTOGRAPHY:

Median kPa:

Diagnostic category:  < or = 5 kPa: high probability of being normal

The use of hepatic elastography is applicable to patients with viral
hepatitis and non-alcoholic fatty liver disease. At this time, there
is insufficient data for the referenced cut-off values and use in
other causes of liver disease, including alcoholic liver disease.
Patients, however, may be assessed by elastography and serve as
their own reference standard/baseline.

In patients with non-alcoholic liver disease, the values suggesting
compensated advanced chronic liver disease (cACLD) may be lower, and
patients may need additional testing with elasticity results of [DATE]
kPa.

Please note that abnormal hepatic elasticity and shear wave
velocities may also be identified in clinical settings other than
with hepatic fibrosis, such as: acute hepatitis, elevated right
heart and central venous pressures including use of beta blockers,
Pivaral disease ([place]), infiltrative processes such as
mastocytosis/amyloidosis/infiltrative tumor/lymphoma, extrahepatic
cholestasis, with hyperemia in the post-prandial state, and with
liver transplantation. Correlation with patient history, laboratory
data, and clinical condition recommended.

Diagnostic Categories:

< or =5 kPa: high probability of being normal

< or =9 kPa: in the absence of other known clinical signs, rules [DATE] kPa and ?13 kPa: suggestive of cACLD, but needs further testing

>13 kPa: highly suggestive of cACLD

> or =17 kPa: highly suggestive of cACLD with an increased
probability of clinically significant portal hypertension

## 2020-05-03 DIAGNOSIS — M7541 Impingement syndrome of right shoulder: Secondary | ICD-10-CM | POA: Insufficient documentation

## 2020-05-11 ENCOUNTER — Other Ambulatory Visit: Payer: Self-pay

## 2020-05-11 ENCOUNTER — Ambulatory Visit (INDEPENDENT_AMBULATORY_CARE_PROVIDER_SITE_OTHER): Payer: Managed Care, Other (non HMO)

## 2020-05-11 DIAGNOSIS — Z23 Encounter for immunization: Secondary | ICD-10-CM | POA: Diagnosis not present

## 2020-05-11 NOTE — Progress Notes (Addendum)
Patient presented for Twinrix injection to left deltoid, patient voiced no concerns nor showed any signs of distress during injection.

## 2020-06-03 ENCOUNTER — Other Ambulatory Visit: Payer: Self-pay | Admitting: Family Medicine

## 2020-06-15 ENCOUNTER — Telehealth: Payer: Self-pay | Admitting: Family Medicine

## 2020-06-15 DIAGNOSIS — E785 Hyperlipidemia, unspecified: Secondary | ICD-10-CM

## 2020-06-15 DIAGNOSIS — I10 Essential (primary) hypertension: Secondary | ICD-10-CM

## 2020-06-15 DIAGNOSIS — R7303 Prediabetes: Secondary | ICD-10-CM

## 2020-06-15 NOTE — Telephone Encounter (Signed)
Patient made an appt for 07/14/2020, physical for work. Patient wanted to know if he could do labs  Before appt, if needed.

## 2020-06-16 NOTE — Telephone Encounter (Signed)
Labs ordered.  He can be scheduled for labs a few days ahead of his physical.

## 2020-06-16 NOTE — Telephone Encounter (Addendum)
Patient informed and verbalized understanding.  States that he will go and have these done at [facility], re-ordered labs to be collected outside of office

## 2020-06-16 NOTE — Telephone Encounter (Signed)
Last had labs 12/05/19 and 01/13/20.  Please advise

## 2020-07-09 ENCOUNTER — Encounter: Payer: Self-pay | Admitting: Family Medicine

## 2020-07-09 DIAGNOSIS — R7303 Prediabetes: Secondary | ICD-10-CM

## 2020-07-09 DIAGNOSIS — E785 Hyperlipidemia, unspecified: Secondary | ICD-10-CM

## 2020-07-09 DIAGNOSIS — I10 Essential (primary) hypertension: Secondary | ICD-10-CM

## 2020-07-14 ENCOUNTER — Ambulatory Visit (INDEPENDENT_AMBULATORY_CARE_PROVIDER_SITE_OTHER): Payer: Managed Care, Other (non HMO) | Admitting: Family Medicine

## 2020-07-14 ENCOUNTER — Other Ambulatory Visit: Payer: Self-pay

## 2020-07-14 NOTE — Progress Notes (Signed)
Patient will be rescheduled.  He had a Covid test in the last 10 days.  This was negative though given the current protocols with the health system he has to remain out of the office for 21 days prior to coming in for a visit.  We offered him an appointment on 07/28/2020 at 4:30 PM.

## 2020-07-21 ENCOUNTER — Other Ambulatory Visit: Payer: Self-pay

## 2020-07-21 DIAGNOSIS — D500 Iron deficiency anemia secondary to blood loss (chronic): Secondary | ICD-10-CM

## 2020-07-30 ENCOUNTER — Other Ambulatory Visit: Payer: Self-pay | Admitting: Gastroenterology

## 2020-07-31 LAB — COMPREHENSIVE METABOLIC PANEL
ALT: 57 IU/L — ABNORMAL HIGH (ref 0–44)
AST: 35 IU/L (ref 0–40)
Albumin/Globulin Ratio: 1.7 (ref 1.2–2.2)
Albumin: 4.5 g/dL (ref 3.8–4.8)
Alkaline Phosphatase: 73 IU/L (ref 44–121)
BUN/Creatinine Ratio: 14 (ref 10–24)
BUN: 14 mg/dL (ref 8–27)
Bilirubin Total: 0.5 mg/dL (ref 0.0–1.2)
CO2: 26 mmol/L (ref 20–29)
Calcium: 10 mg/dL (ref 8.6–10.2)
Chloride: 102 mmol/L (ref 96–106)
Creatinine, Ser: 1.01 mg/dL (ref 0.76–1.27)
GFR calc Af Amer: 91 mL/min/{1.73_m2} (ref >59)
GFR calc non Af Amer: 79 mL/min/{1.73_m2} (ref >59)
Globulin, Total: 2.6 g/dL (ref 1.5–4.5)
Glucose: 139 mg/dL — ABNORMAL HIGH (ref 65–99)
Potassium: 4 mmol/L (ref 3.5–5.2)
Sodium: 145 mmol/L — ABNORMAL HIGH (ref 134–144)
Total Protein: 7.1 g/dL (ref 6.0–8.5)

## 2020-07-31 LAB — CBC WITH DIFFERENTIAL/PLATELET
Basophils Absolute: 0.1 10*3/uL (ref 0.0–0.2)
Basos: 1 %
EOS (ABSOLUTE): 0.7 10*3/uL — ABNORMAL HIGH (ref 0.0–0.4)
Eos: 9 %
Hematocrit: 43.3 % (ref 37.5–51.0)
Hemoglobin: 14.6 g/dL (ref 13.0–17.7)
Immature Grans (Abs): 0 10*3/uL (ref 0.0–0.1)
Immature Granulocytes: 0 %
Lymphocytes Absolute: 1.8 10*3/uL (ref 0.7–3.1)
Lymphs: 24 %
MCH: 30.4 pg (ref 26.6–33.0)
MCHC: 33.7 g/dL (ref 31.5–35.7)
MCV: 90 fL (ref 79–97)
Monocytes Absolute: 0.7 10*3/uL (ref 0.1–0.9)
Monocytes: 9 %
Neutrophils Absolute: 4.4 10*3/uL (ref 1.4–7.0)
Neutrophils: 57 %
Platelets: 266 10*3/uL (ref 150–450)
RBC: 4.8 x10E6/uL (ref 4.14–5.80)
RDW: 12.8 % (ref 11.6–15.4)
WBC: 7.6 10*3/uL (ref 3.4–10.8)

## 2020-07-31 LAB — LIPID PANEL
Chol/HDL Ratio: 3.9 ratio (ref 0.0–5.0)
Cholesterol, Total: 169 mg/dL (ref 100–199)
HDL: 43 mg/dL (ref >39)
LDL Chol Calc (NIH): 96 mg/dL (ref 0–99)
Triglycerides: 174 mg/dL — ABNORMAL HIGH (ref 0–149)
VLDL Cholesterol Cal: 30 mg/dL (ref 5–40)

## 2020-07-31 LAB — IRON AND TIBC
Iron Saturation: 29 % (ref 15–55)
Iron: 89 ug/dL (ref 38–169)
Total Iron Binding Capacity: 311 ug/dL (ref 250–450)
UIBC: 222 ug/dL (ref 111–343)

## 2020-07-31 LAB — HEMOGLOBIN A1C
Est. average glucose Bld gHb Est-mCnc: 146 mg/dL
Hgb A1c MFr Bld: 6.7 % — ABNORMAL HIGH (ref 4.8–5.6)

## 2020-07-31 LAB — FERRITIN: Ferritin: 232 ng/mL (ref 30–400)

## 2020-08-02 ENCOUNTER — Encounter: Payer: Self-pay | Admitting: Family Medicine

## 2020-08-02 ENCOUNTER — Ambulatory Visit (INDEPENDENT_AMBULATORY_CARE_PROVIDER_SITE_OTHER): Payer: Managed Care, Other (non HMO) | Admitting: Family Medicine

## 2020-08-02 ENCOUNTER — Other Ambulatory Visit: Payer: Self-pay

## 2020-08-02 VITALS — BP 130/70 | HR 76 | Temp 98.4°F | Ht 68.0 in | Wt 233.4 lb

## 2020-08-02 DIAGNOSIS — Z125 Encounter for screening for malignant neoplasm of prostate: Secondary | ICD-10-CM

## 2020-08-02 DIAGNOSIS — Z114 Encounter for screening for human immunodeficiency virus [HIV]: Secondary | ICD-10-CM | POA: Diagnosis not present

## 2020-08-02 DIAGNOSIS — Z0000 Encounter for general adult medical examination without abnormal findings: Secondary | ICD-10-CM

## 2020-08-02 DIAGNOSIS — E119 Type 2 diabetes mellitus without complications: Secondary | ICD-10-CM

## 2020-08-02 DIAGNOSIS — Z23 Encounter for immunization: Secondary | ICD-10-CM | POA: Diagnosis not present

## 2020-08-02 NOTE — Patient Instructions (Addendum)
Nice to see you/ Please work on cutting down on fried fatty foods as we discussed.  Please decrease eating out as well.  Please continue with exercise. Please get your lab work completed on January 8 or later and we will follow up a couple of days after that.

## 2020-08-02 NOTE — Progress Notes (Signed)
[name], MD Phone: [phone]  Collin Mills is a 64 y.o. male who presents today for CPE.  Diet: Patient has been working on cutting carbs.  Typically has an egg, bacon, and toast for breakfast.  Has an apple for snack.  Lunch consists of a sandwich and chips.  They do eat dinner out 5 days a week though he wants to start eating at home more frequently. Exercise: Going to the gym and doing mostly legs 3 days a week.  Has had some shoulder issues for which she is seeing orthopedics and that limits his upper body workout. Colonoscopy: 11/22/2018, 3-year recall, tubular adenoma Prostate cancer screening: Due Family history-  Prostate cancer: No  Colon cancer: No Vaccines-   Flu: Up-to-date  Tetanus: Up-to-date  Shingles: Up-to-date  COVID19: Up-to-date HIV screening: Due Hep C Screening: Up-to-date Tobacco use: No Alcohol use: 4/week Illicit Drug use: No Dentist: Yes Ophthalmology: Yes   Active Ambulatory Problems    Diagnosis Date Noted  . Osteoarthritis 12/28/2015  . Allergic rhinitis 12/28/2015  . Essential hypertension 12/28/2015  . Hyperlipidemia 12/28/2015  . Abdominal fullness 04/03/2016  . Obesity 08/02/2016  . Dizziness 05/23/2017  . Change in vision 05/23/2017  . Cerebral aneurysm 06/13/2017  . Routine general medical examination at a health care facility 01/21/2018  . Rotator cuff impingement syndrome of left shoulder 01/21/2018  . Prediabetes 07/15/2018  . Iron deficiency anemia 11/11/2018  . Hx of adenomatous colonic polyps 11/13/2018  . Eosinophilia 04/08/2019  . Elevated LFTs 06/16/2019  . Right elbow pain 12/05/2019   Resolved Ambulatory Problems    Diagnosis Date Noted  . Frequent urination 12/28/2015  . Bilateral lower extremity edema 04/03/2016  . Cough 08/02/2016  . Acute bronchitis 09/05/2016   Past Medical History:  Diagnosis Date  . Allergy   . Anemia   . Barrett's esophagus   . Chickenpox   . Colon polyps   . GERD  (gastroesophageal reflux disease)   . Hypertension   . Migraines     Family History  Problem Relation Age of Onset  . Lung cancer Mother   . COPD Mother   . Lung cancer Father   . Stroke Father   . Spina bifida Sister   . Cirrhosis Brother   . Colon cancer Neg Hx   . Esophageal cancer Neg Hx   . Rectal cancer Neg Hx   . Stomach cancer Neg Hx     Social History   Socioeconomic History  . Marital status: Married    Spouse name: Not on file  . Number of children: 1  . Years of education: Not on file  . Highest education level: Not on file  Occupational History  . Not on file  Tobacco Use  . Smoking status: Former Smoker    Packs/day: 2.00    Years: 36.00    Pack years: 72.00    Quit date: 2004    Years since quitting: 17.8  . Smokeless tobacco: Never Used  Vaping Use  . Vaping Use: Never used  Substance and Sexual Activity  . Alcohol use: Yes    Alcohol/week: 5.0 standard drinks    Types: 5 Shots of liquor per week    Comment: occasional  . Drug use: No  . Sexual activity: Not on file  Other Topics Concern  . Not on file  Social History Narrative  . Not on file   Social Determinants of Health   Financial Resource Strain:   . Difficulty of  Paying Living Expenses: Not on file  Food Insecurity:   . Worried About Charity fundraiser in the Last Year: Not on file  . Ran Out of Food in the Last Year: Not on file  Transportation Needs:   . Lack of Transportation (Medical): Not on file  . Lack of Transportation (Non-Medical): Not on file  Physical Activity:   . Days of Exercise per Week: Not on file  . Minutes of Exercise per Session: Not on file  Stress:   . Feeling of Stress : Not on file  Social Connections:   . Frequency of Communication with Friends and Family: Not on file  . Frequency of Social Gatherings with Friends and Family: Not on file  . Attends Religious Services: Not on file  . Active Member of Clubs or Organizations: Not on file  . Attends  Archivist Meetings: Not on file  . Marital Status: Not on file  Intimate Partner Violence:   . Fear of Current or Ex-Partner: Not on file  . Emotionally Abused: Not on file  . Physically Abused: Not on file  . Sexually Abused: Not on file    ROS  General:  Negative for nexplained weight loss, fever Skin: Negative for new or changing mole, sore that won't heal HEENT: Negative for trouble hearing, trouble seeing, ringing in ears, mouth sores, hoarseness, change in voice, dysphagia. CV:  Negative for chest pain, dyspnea, edema, palpitations Resp: Negative for cough, dyspnea, hemoptysis GI: Negative for nausea, vomiting, diarrhea, constipation, abdominal pain, melena, hematochezia. GU: Negative for dysuria, incontinence, urinary hesitance, hematuria, vaginal or penile discharge, polyuria, sexual difficulty, lumps in testicle or breasts MSK: Negative for muscle cramps or aches, joint pain or swelling Neuro: Negative for headaches, weakness, numbness, dizziness, passing out/fainting Psych: Negative for depression, anxiety, memory problems  Objective  Physical Exam Vitals:   08/02/20 1133  BP: 130/70  Pulse: 76  Temp: 98.4 F (36.9 C)  SpO2: 97%    BP Readings from Last 3 Encounters:  08/02/20 130/70  01/13/20 130/70  06/16/19 120/80   Wt Readings from Last 3 Encounters:  08/02/20 233 lb 6.4 oz (105.9 kg)  01/13/20 237 lb 6.4 oz (107.7 kg)  12/05/19 236 lb (107 kg)    Physical Exam Constitutional:      General: He is not in acute distress.    Appearance: He is not diaphoretic.  HENT:     Head: Normocephalic and atraumatic.  Eyes:     Conjunctiva/sclera: Conjunctivae normal.     Pupils: Pupils are equal, round, and reactive to light.  Cardiovascular:     Rate and Rhythm: Normal rate and regular rhythm.     Heart sounds: Normal heart sounds.  Pulmonary:     Effort: Pulmonary effort is normal.     Breath sounds: Normal breath sounds.  Abdominal:      General: Bowel sounds are normal. There is no distension.     Palpations: Abdomen is soft.     Tenderness: There is no abdominal tenderness. There is no guarding or rebound.  Musculoskeletal:     Right lower leg: No edema.     Left lower leg: No edema.  Lymphadenopathy:     Cervical: No cervical adenopathy.  Skin:    General: Skin is warm and dry.  Neurological:     Mental Status: He is alert.  Psychiatric:        Mood and Affect: Mood normal.      Assessment/Plan:  Problem List Items Addressed This Visit    Routine general medical examination at a health care facility - Primary    Physical exam completed.  Encouraged healthy diet and exercise.  Discussed eating at home more frequently.  Discussed continued activity as he is able to given his shoulder limitations.  PSA ordered today.  Vaccines up-to-date.  HIV screening completed.  Colonoscopy up-to-date.  Lab work as outlined below.       Other Visit Diagnoses    Need for immunization against influenza       Relevant Orders   Flu Vaccine QUAD 36+ mos IM (Completed)   Prostate cancer screening       Relevant Orders   PSA   Type 2 diabetes mellitus without complication, without long-term current use of insulin ([place])       Relevant Orders   HgB A1c   Lipid panel   Encounter for screening for HIV       Relevant Orders   HIV antibody (with reflex)      This visit occurred during the SARS-CoV-2 public health emergency.  Safety protocols were in place, including screening questions prior to the visit, additional usage of staff PPE, and extensive cleaning of exam room while observing appropriate contact time as indicated for disinfecting solutions.    [name], MD [place]

## 2020-08-09 ENCOUNTER — Encounter: Payer: Self-pay | Admitting: Family Medicine

## 2020-08-09 NOTE — Assessment & Plan Note (Signed)
Physical exam completed.  Encouraged healthy diet and exercise.  Discussed eating at home more frequently.  Discussed continued activity as he is able to given his shoulder limitations.  PSA ordered today.  Vaccines up-to-date.  HIV screening completed.  Colonoscopy up-to-date.  Lab work as outlined below.

## 2020-08-11 ENCOUNTER — Telehealth: Payer: Self-pay

## 2020-08-11 DIAGNOSIS — R7989 Other specified abnormal findings of blood chemistry: Secondary | ICD-10-CM

## 2020-08-11 DIAGNOSIS — E785 Hyperlipidemia, unspecified: Secondary | ICD-10-CM

## 2020-08-11 NOTE — Telephone Encounter (Signed)
Labs ordered per result note.

## 2020-08-12 ENCOUNTER — Other Ambulatory Visit: Payer: Self-pay | Admitting: Family Medicine

## 2020-08-12 DIAGNOSIS — R7989 Other specified abnormal findings of blood chemistry: Secondary | ICD-10-CM

## 2020-08-12 DIAGNOSIS — E119 Type 2 diabetes mellitus without complications: Secondary | ICD-10-CM

## 2020-08-12 DIAGNOSIS — E785 Hyperlipidemia, unspecified: Secondary | ICD-10-CM

## 2020-08-12 MED ORDER — METFORMIN HCL 500 MG PO TABS: 500.0000 mg | ORAL_TABLET | Freq: Two times a day (BID) | ORAL | 3 refills | Status: DC

## 2020-08-12 MED ORDER — ATORVASTATIN CALCIUM 80 MG PO TABS: 80.0000 mg | ORAL_TABLET | Freq: Every day | ORAL | 1 refills | Status: DC

## 2020-08-17 ENCOUNTER — Other Ambulatory Visit: Payer: Self-pay | Admitting: Family Medicine

## 2020-08-17 DIAGNOSIS — I10 Essential (primary) hypertension: Secondary | ICD-10-CM

## 2020-08-27 ENCOUNTER — Encounter: Payer: Managed Care, Other (non HMO) | Admitting: Family Medicine

## 2020-09-14 ENCOUNTER — Ambulatory Visit (INDEPENDENT_AMBULATORY_CARE_PROVIDER_SITE_OTHER): Payer: Managed Care, Other (non HMO)

## 2020-09-14 ENCOUNTER — Other Ambulatory Visit: Payer: Self-pay

## 2020-09-14 DIAGNOSIS — Z23 Encounter for immunization: Secondary | ICD-10-CM

## 2020-09-14 NOTE — Progress Notes (Addendum)
Patient presented for twinrix injection to left deltoid, patient voiced no concerns nor showed any signs of distress during injection.  Reviewed  Dr Nicki Reaper

## 2020-09-28 LAB — LIPID PANEL
Chol/HDL Ratio: 3.1 ratio (ref 0.0–5.0)
Cholesterol, Total: 151 mg/dL (ref 100–199)
HDL: 48 mg/dL (ref >39)
LDL Chol Calc (NIH): 70 mg/dL (ref 0–99)
Triglycerides: 196 mg/dL — ABNORMAL HIGH (ref 0–149)
VLDL Cholesterol Cal: 33 mg/dL (ref 5–40)

## 2020-09-28 LAB — HEPATIC FUNCTION PANEL
ALT: 30 IU/L (ref 0–44)
AST: 20 IU/L (ref 0–40)
Albumin: 4.7 g/dL (ref 3.8–4.8)
Alkaline Phosphatase: 73 IU/L (ref 44–121)
Bilirubin Total: 0.7 mg/dL (ref 0.0–1.2)
Bilirubin, Direct: 0.15 mg/dL (ref 0.00–0.40)
Total Protein: 7.1 g/dL (ref 6.0–8.5)

## 2020-09-30 ENCOUNTER — Encounter: Payer: Self-pay | Admitting: Family Medicine

## 2020-09-30 DIAGNOSIS — E785 Hyperlipidemia, unspecified: Secondary | ICD-10-CM

## 2020-10-06 ENCOUNTER — Other Ambulatory Visit: Payer: Self-pay | Admitting: Family Medicine

## 2020-10-24 ENCOUNTER — Other Ambulatory Visit: Payer: Self-pay | Admitting: Family Medicine

## 2020-10-29 ENCOUNTER — Other Ambulatory Visit: Payer: Self-pay

## 2020-10-30 LAB — LDL CHOLESTEROL, DIRECT: LDL Direct: 74 mg/dL (ref 0–99)

## 2020-11-02 ENCOUNTER — Ambulatory Visit: Payer: Managed Care, Other (non HMO) | Admitting: Family Medicine

## 2020-11-02 ENCOUNTER — Encounter: Payer: Self-pay | Admitting: Family Medicine

## 2020-11-02 ENCOUNTER — Other Ambulatory Visit: Payer: Self-pay

## 2020-11-02 VITALS — BP 90/60 | HR 92 | Temp 97.2°F | Ht 68.0 in | Wt 218.8 lb

## 2020-11-02 DIAGNOSIS — E6609 Other obesity due to excess calories: Secondary | ICD-10-CM

## 2020-11-02 DIAGNOSIS — M25511 Pain in right shoulder: Secondary | ICD-10-CM | POA: Insufficient documentation

## 2020-11-02 DIAGNOSIS — E785 Hyperlipidemia, unspecified: Secondary | ICD-10-CM | POA: Diagnosis not present

## 2020-11-02 DIAGNOSIS — I10 Essential (primary) hypertension: Secondary | ICD-10-CM | POA: Diagnosis not present

## 2020-11-02 DIAGNOSIS — Z23 Encounter for immunization: Secondary | ICD-10-CM | POA: Diagnosis not present

## 2020-11-02 DIAGNOSIS — Z6833 Body mass index (BMI) 33.0-33.9, adult: Secondary | ICD-10-CM

## 2020-11-02 DIAGNOSIS — E119 Type 2 diabetes mellitus without complications: Secondary | ICD-10-CM

## 2020-11-02 NOTE — Assessment & Plan Note (Signed)
He will continue Lipitor 80 mg once daily.  He will continue with diet and exercise changes.  Plan to recheck labs in 3 months.

## 2020-11-02 NOTE — Assessment & Plan Note (Signed)
Diastolics are slightly above goal at home though blood pressure today is borderline low.  He will continue losartan 100 mg once daily, HCTZ 25 mg once daily, and amlodipine 5 mg once daily.  He will monitor his blood pressure at home and if it starts to drop down towards 100/60 he will discontinue his amlodipine.  If he starts to get lightheaded he will contact us.

## 2020-11-02 NOTE — Progress Notes (Signed)
[name], MD Phone: [phone]  Collin Mills is a 65 y.o. male who presents today for f/u.  HYPERTENSION Disease Monitoring: Blood pressure range-120s/80s Chest pain- no      Dyspnea- no Medications: Compliance- taking amlodipine, losartan, HCTZ Lightheadedness- no   Edema- no  DIABETES Disease Monitoring: Blood Sugar ranges-87-107 Polyuria/phagia/dipsia- no      Optho- due Medications: Compliance- taking metformin Hypoglycemic symptoms- no  HYPERLIPIDEMIA Disease Monitoring: See symptoms for Hypertension Medications: Compliance- taking lipitor Right upper quadrant pain- no  Muscle aches- no  Patient started keto diet 7 days ago.  Prior to that he had cut down on carbs and sugars and was lifting weights and walking.  He is down 21 pounds since August.  He is holding off on additional exercise until he is several weeks into the keto diet.  Right shoulder pain: Patient has had this evaluated by orthopedics and had an MRI that he reports revealed inflammation.  He completed PT.  Notes that shoulder has improved.  Social History   Tobacco Use  Smoking Status Former Smoker  . Packs/day: 2.00  . Years: 36.00  . Pack years: 72.00  . Quit date: 2004  . Years since quitting: 18.0  Smokeless Tobacco Never Used    Current Outpatient Medications on File Prior to Visit  Medication Sig Dispense Refill  . amLODipine (NORVASC) 5 MG tablet TAKE 1 TABLET BY MOUTH EVERY DAY 90 tablet 1  . atorvastatin (LIPITOR) 80 MG tablet Take 1 tablet (80 mg total) by mouth daily. 90 tablet 1  . fluticasone (FLONASE) 50 MCG/ACT nasal spray Place into both nostrils daily.    . hydrochlorothiazide (HYDRODIURIL) 25 MG tablet TAKE 1 TABLET(25 MG) BY MOUTH DAILY 90 tablet 1  . hydrocortisone (ANUSOL-HC) 25 MG suppository Place 1 suppository (25 mg total) rectally at bedtime. 30 suppository 3  . losartan (COZAAR) 100 MG tablet TAKE 1 TABLET(100 MG) BY MOUTH DAILY 90 tablet 1  . metFORMIN  (GLUCOPHAGE) 500 MG tablet Take 1 tablet (500 mg total) by mouth 2 (two) times daily with a meal. 180 tablet 3  . omeprazole (PRILOSEC) 40 MG capsule TAKE 1 CAPSULE(40 MG) BY MOUTH DAILY 90 capsule 3   No current facility-administered medications on file prior to visit.     ROS see history of present illness  Objective  Physical Exam Vitals:   11/02/20 0819  BP: 90/60  Pulse: 92  Temp: (!) 97.2 F (36.2 C)  SpO2: 98%    BP Readings from Last 3 Encounters:  11/02/20 90/60  08/02/20 130/70  01/13/20 130/70   Wt Readings from Last 3 Encounters:  11/02/20 218 lb 12.8 oz (99.2 kg)  08/02/20 233 lb 6.4 oz (105.9 kg)  01/13/20 237 lb 6.4 oz (107.7 kg)    Physical Exam Constitutional:      General: He is not in acute distress.    Appearance: He is not diaphoretic.  Cardiovascular:     Rate and Rhythm: Normal rate and regular rhythm.     Heart sounds: Normal heart sounds.  Pulmonary:     Effort: Pulmonary effort is normal.     Breath sounds: Normal breath sounds.  Musculoskeletal:        General: No edema.     Right lower leg: No edema.     Left lower leg: No edema.  Skin:    General: Skin is warm and dry.  Neurological:     Mental Status: He is alert.    Diabetic Foot  Exam - Simple   Simple Foot Form Diabetic Foot exam was performed with the following findings: Yes 11/02/2020  8:43 AM  Visual Inspection See comments: Yes Sensation Testing Intact to touch and monofilament testing bilaterally: Yes Pulse Check Posterior Tibialis and Dorsalis pulse intact bilaterally: Yes Comments Missing left great toenail, otherwise no deformities, ulcerations, or skin breakdown      Assessment/Plan: Please see individual problem list.  Problem List Items Addressed This Visit    Diabetes mellitus without complication ([place])    Very well controlled based on home CBGs.  We will have him continue his metformin 500 mg twice daily.  He will return in about 3 months for recheck of  labs.      Relevant Orders   HgB A1c   Essential hypertension    Diastolics are slightly above goal at home though blood pressure today is borderline low.  He will continue losartan 100 mg once daily, HCTZ 25 mg once daily, and amlodipine 5 mg once daily.  He will monitor his blood pressure at home and if it starts to drop down towards 100/60 he will discontinue his amlodipine.  If he starts to get lightheaded he will contact us.      Relevant Orders   Comp Met (CMET)   Hyperlipidemia    He will continue Lipitor 80 mg once daily.  He will continue with diet and exercise changes.  Plan to recheck labs in 3 months.      Relevant Orders   Lipid panel   Obesity    Patient will continue with diet and exercise changes.  Weight has been trending down.      Right shoulder pain    Improved with PT.  He will monitor.       Other Visit Diagnoses    Need for pneumococcal vaccination    -  Primary   Relevant Orders   Pneumococcal polysaccharide vaccine 23-valent greater than or equal to 2yo subcutaneous/IM (Completed)       Health Maintenance: Patient reports having had his COVID booster in October.  Pneumovax given today.     This visit occurred during the SARS-CoV-2 public health emergency.  Safety protocols were in place, including screening questions prior to the visit, additional usage of staff PPE, and extensive cleaning of exam room while observing appropriate contact time as indicated for disinfecting solutions.    [name], MD [place]

## 2020-11-02 NOTE — Patient Instructions (Signed)
Nice to see you. Please continue your current medication regimen. If your blood pressure gets down to around 100/60 you can discontinue the amlodipine.  If you start to get lightheaded please let us know. Please continue with diet and exercise. Get your lab work about a week before your next visit.

## 2020-11-02 NOTE — Assessment & Plan Note (Signed)
Very well controlled based on home CBGs.  We will have him continue his metformin 500 mg twice daily.  He will return in about 3 months for recheck of labs.

## 2020-11-02 NOTE — Assessment & Plan Note (Signed)
Improved with PT.  He will monitor.

## 2020-11-02 NOTE — Assessment & Plan Note (Signed)
Patient will continue with diet and exercise changes.  Weight has been trending down.

## 2020-11-24 ENCOUNTER — Encounter: Payer: Self-pay | Admitting: Family Medicine

## 2020-12-03 ENCOUNTER — Encounter: Payer: Self-pay | Admitting: Family Medicine

## 2020-12-16 ENCOUNTER — Encounter: Payer: Self-pay | Admitting: Family Medicine

## 2020-12-29 ENCOUNTER — Telehealth: Payer: Self-pay

## 2020-12-29 NOTE — Telephone Encounter (Signed)
Lm on mobile vm for patient to return call to schedule 6 month follow up appointment

## 2020-12-29 NOTE — Telephone Encounter (Signed)
-----   Message from Hughie Closs, RN sent at 08/10/2020  3:58 PM EDT ----- Needs 6 month F/U office visit for abnormal LFTs = 02/08/21

## 2020-12-30 LAB — HM DIABETES EYE EXAM

## 2021-01-04 ENCOUNTER — Encounter: Payer: Self-pay | Admitting: Gastroenterology

## 2021-01-04 NOTE — Telephone Encounter (Signed)
Please schedule patient for a follow up with Dr. Silverio Decamp in April. 6 month follow up for abnormal LFTs. Thank you.

## 2021-01-06 NOTE — Telephone Encounter (Signed)
The pt has been advised I see no mention of needing labs prior to the upcoming office visit.  He will proceed as planned with scheduled office visit.

## 2021-01-06 NOTE — Telephone Encounter (Signed)
Patient scheduled for 03/24/21 with Dr. Silverio Decamp.  Pateint states that if he needs to have any labs done prior to appt to please send the orders to [facility] on [address] in [place].

## 2021-01-29 LAB — LIPID PANEL
Chol/HDL Ratio: 3.3 ratio (ref 0.0–5.0)
Cholesterol, Total: 186 mg/dL (ref 100–199)
HDL: 56 mg/dL (ref >39)
LDL Chol Calc (NIH): 107 mg/dL — ABNORMAL HIGH (ref 0–99)
Triglycerides: 132 mg/dL (ref 0–149)
VLDL Cholesterol Cal: 23 mg/dL (ref 5–40)

## 2021-01-29 LAB — COMPREHENSIVE METABOLIC PANEL
ALT: 25 IU/L (ref 0–44)
AST: 22 IU/L (ref 0–40)
Albumin/Globulin Ratio: 2 (ref 1.2–2.2)
Albumin: 4.9 g/dL — ABNORMAL HIGH (ref 3.8–4.8)
Alkaline Phosphatase: 74 IU/L (ref 44–121)
BUN/Creatinine Ratio: 17 (ref 10–24)
BUN: 18 mg/dL (ref 8–27)
Bilirubin Total: 0.5 mg/dL (ref 0.0–1.2)
CO2: 24 mmol/L (ref 20–29)
Calcium: 10.5 mg/dL — ABNORMAL HIGH (ref 8.6–10.2)
Chloride: 102 mmol/L (ref 96–106)
Creatinine, Ser: 1.09 mg/dL (ref 0.76–1.27)
Globulin, Total: 2.4 g/dL (ref 1.5–4.5)
Glucose: 105 mg/dL — ABNORMAL HIGH (ref 65–99)
Potassium: 4 mmol/L (ref 3.5–5.2)
Sodium: 145 mmol/L — ABNORMAL HIGH (ref 134–144)
Total Protein: 7.3 g/dL (ref 6.0–8.5)
eGFR: 76 mL/min/{1.73_m2} (ref >59)

## 2021-01-29 LAB — HEMOGLOBIN A1C
Est. average glucose Bld gHb Est-mCnc: 111 mg/dL
Hgb A1c MFr Bld: 5.5 % (ref 4.8–5.6)

## 2021-01-31 ENCOUNTER — Other Ambulatory Visit: Payer: Self-pay

## 2021-01-31 ENCOUNTER — Ambulatory Visit: Payer: Managed Care, Other (non HMO) | Admitting: Family Medicine

## 2021-01-31 ENCOUNTER — Encounter: Payer: Self-pay | Admitting: Family Medicine

## 2021-01-31 DIAGNOSIS — E785 Hyperlipidemia, unspecified: Secondary | ICD-10-CM | POA: Diagnosis not present

## 2021-01-31 DIAGNOSIS — I10 Essential (primary) hypertension: Secondary | ICD-10-CM

## 2021-01-31 DIAGNOSIS — E119 Type 2 diabetes mellitus without complications: Secondary | ICD-10-CM | POA: Diagnosis not present

## 2021-01-31 DIAGNOSIS — Z6830 Body mass index (BMI) 30.0-30.9, adult: Secondary | ICD-10-CM

## 2021-01-31 DIAGNOSIS — E6609 Other obesity due to excess calories: Secondary | ICD-10-CM

## 2021-01-31 MED ORDER — GLUCOSE BLOOD VI STRP: ORAL_STRIP | 12 refills | Status: DC

## 2021-01-31 MED ORDER — LOSARTAN POTASSIUM 50 MG PO TABS: 50.0000 mg | ORAL_TABLET | Freq: Every day | ORAL | 3 refills | Status: DC

## 2021-01-31 NOTE — Assessment & Plan Note (Signed)
Weight has been trending down with a keto diet.  I discussed that he needs to decrease his fatty food intake given his elevated cholesterol.  He will replace these with more healthy fats and proteins.

## 2021-01-31 NOTE — Assessment & Plan Note (Signed)
Plan to recheck on upcoming labs.

## 2021-01-31 NOTE — Assessment & Plan Note (Signed)
Much improved.  He will continue to monitor his home glucose.  He will continue Metformin 500 mg once daily.  Discussed the potential for coming off of this after his next visit if his A1c is still in the normal range.

## 2021-01-31 NOTE — Progress Notes (Signed)
[name], MD Phone: [phone]  Collin Mills is a 65 y.o. male who presents today for f/u.  HYPERTENSION Disease Monitoring: Blood pressure range-110-130s over 80s chest pain-no      dyspnea-no Medications: Compliance-hydrochlorothiazide 25 mg daily    Edema-no  DIABETES Disease Monitoring: Blood Sugar ranges-less than 111 polyuria/phagia/dipsia-no      Optho-up-to-date Medications: Compliance-taking Metformin 500 mg once daily hypoglycemic symptoms-no  HYPERLIPIDEMIA Disease Monitoring: See symptoms for Hypertension Medications: Compliance-taking Lipitor 80 mg once daily right upper quadrant pain-no muscle aches-no  Patient reports has been on the keto diet.  He is eating lots of bacon, eggs, beef, and sausage.  He is exercising.  He has lost quite a bit of weight doing this.  Notes he wants to lose about 20 more pounds.  GERD: Taking omeprazole.  If he misses a couple of doses he will be able to tell it.  No abdominal pain.  No blood in stool.  No dysphagia.  He does have history of Barrett's esophagus and has an EGD every 3 years.  Calcium was minimally elevated and albumin was minimally elevated on labs.     Social History   Tobacco Use  Smoking Status Former Smoker  . Packs/day: 2.00  . Years: 36.00  . Pack years: 72.00  . Quit date: 2004  . Years since quitting: 18.2  Smokeless Tobacco Never Used    Current Outpatient Medications on File Prior to Visit  Medication Sig Dispense Refill  . fluticasone (FLONASE) 50 MCG/ACT nasal spray Place into both nostrils daily.    . hydrochlorothiazide (HYDRODIURIL) 25 MG tablet TAKE 1 TABLET(25 MG) BY MOUTH DAILY 90 tablet 1  . hydrocortisone (ANUSOL-HC) 25 MG suppository Place 1 suppository (25 mg total) rectally at bedtime. 30 suppository 3  . metFORMIN (GLUCOPHAGE) 500 MG tablet Take 1 tablet (500 mg total) by mouth 2 (two) times daily with a meal. 180 tablet 3  . omeprazole (PRILOSEC) 40 MG capsule TAKE 1  CAPSULE(40 MG) BY MOUTH DAILY 90 capsule 3  . amLODipine (NORVASC) 5 MG tablet TAKE 1 TABLET BY MOUTH EVERY DAY 90 tablet 1  . atorvastatin (LIPITOR) 80 MG tablet Take 1 tablet (80 mg total) by mouth daily. 90 tablet 1   No current facility-administered medications on file prior to visit.     ROS see history of present illness  Objective  Physical Exam Vitals:   01/31/21 0930  BP: 130/80  Pulse: 64  Temp: (!) 97.5 F (36.4 C)  SpO2: 98%    BP Readings from Last 3 Encounters:  01/31/21 130/80  11/02/20 90/60  08/02/20 130/70   Wt Readings from Last 3 Encounters:  01/31/21 199 lb 3.2 oz (90.4 kg)  11/02/20 218 lb 12.8 oz (99.2 kg)  08/02/20 233 lb 6.4 oz (105.9 kg)    Physical Exam Constitutional:      General: He is not in acute distress.    Appearance: He is not diaphoretic.  Cardiovascular:     Rate and Rhythm: Normal rate and regular rhythm.     Heart sounds: Normal heart sounds.  Pulmonary:     Effort: Pulmonary effort is normal.     Breath sounds: Normal breath sounds.  Abdominal:     General: Bowel sounds are normal. There is no distension.     Palpations: Abdomen is soft.     Tenderness: There is no abdominal tenderness. There is no guarding or rebound.  Musculoskeletal:     Right lower leg: No edema.  Left lower leg: No edema.  Skin:    General: Skin is warm and dry.  Neurological:     Mental Status: He is alert.      Assessment/Plan: Please see individual problem list.  Problem List Items Addressed This Visit    Diabetes mellitus without complication ([place])    Much improved.  He will continue to monitor his home glucose.  He will continue Metformin 500 mg once daily.  Discussed the potential for coming off of this after his next visit if his A1c is still in the normal range.      Relevant Medications   losartan (COZAAR) 50 MG tablet   Essential hypertension    Above goal.  We will add losartan 50 mg once daily.  He will continue HCTZ 25 mg  daily.  He will return for labs and a nurse BP check in 7 to 10 days.      Relevant Medications   losartan (COZAAR) 50 MG tablet   Other Relevant Orders   Comp Met (CMET)   Hypercalcemia    Plan to recheck on upcoming labs.      Relevant Orders   Comp Met (CMET)   Hyperlipidemia    LDL is above goal.  This is likely diet driven.  He is eating lots of high fat high cholesterol items.  I encouraged him to decrease those.  We will plan on rechecking an LDL in 3 months at his follow-up.  He will continue on Lipitor 80 mg once daily.      Relevant Medications   losartan (COZAAR) 50 MG tablet   Obesity    Weight has been trending down with a keto diet.  I discussed that he needs to decrease his fatty food intake given his elevated cholesterol.  He will replace these with more healthy fats and proteins.         This visit occurred during the SARS-CoV-2 public health emergency.  Safety protocols were in place, including screening questions prior to the visit, additional usage of staff PPE, and extensive cleaning of exam room while observing appropriate contact time as indicated for disinfecting solutions.    [name], MD [place]

## 2021-01-31 NOTE — Patient Instructions (Signed)
Nice to see you. We will get labs in a week. We are restarting losartan 50 mg once daily. Please let me know if your blood pressure is not trending down with this.

## 2021-01-31 NOTE — Addendum Note (Signed)
Addended by: Leone Haven on: 01/31/2021 09:56 AM   Modules accepted: Orders

## 2021-01-31 NOTE — Assessment & Plan Note (Signed)
LDL is above goal.  This is likely diet driven.  He is eating lots of high fat high cholesterol items.  I encouraged him to decrease those.  We will plan on rechecking an LDL in 3 months at his follow-up.  He will continue on Lipitor 80 mg once daily.

## 2021-01-31 NOTE — Assessment & Plan Note (Signed)
Above goal.  We will add losartan 50 mg once daily.  He will continue HCTZ 25 mg daily.  He will return for labs and a nurse BP check in 7 to 10 days.

## 2021-02-05 ENCOUNTER — Other Ambulatory Visit: Payer: Self-pay | Admitting: Family Medicine

## 2021-02-05 DIAGNOSIS — E785 Hyperlipidemia, unspecified: Secondary | ICD-10-CM

## 2021-02-09 ENCOUNTER — Other Ambulatory Visit: Payer: Self-pay

## 2021-02-09 ENCOUNTER — Ambulatory Visit (INDEPENDENT_AMBULATORY_CARE_PROVIDER_SITE_OTHER): Payer: Managed Care, Other (non HMO) | Admitting: *Deleted

## 2021-02-09 DIAGNOSIS — I10 Essential (primary) hypertension: Secondary | ICD-10-CM | POA: Diagnosis not present

## 2021-02-09 NOTE — Progress Notes (Signed)
Patient here for nurse visit BP check per order from 01/31/21.   Patient reports compliance with prescribed BP medications: yes Losartan 50 mg HCTZ 25 MG   Last dose of BP medication: 7 AM  BP Readings from Last 3 Encounters:  02/09/21 110/70  01/31/21 130/80  11/02/20 90/60   Pulse Readings from Last 3 Encounters:  02/09/21 84  01/31/21 64  11/02/20 92      Patient verbalized understanding of instructions.   Kerin Salen, RN

## 2021-02-09 NOTE — Progress Notes (Signed)
BP is acceptable. He should continue with his current regimen.   [name], MD

## 2021-02-10 LAB — COMPREHENSIVE METABOLIC PANEL
ALT: 21 IU/L (ref 0–44)
AST: 22 IU/L (ref 0–40)
Albumin/Globulin Ratio: 1.8 (ref 1.2–2.2)
Albumin: 4.7 g/dL (ref 3.8–4.8)
Alkaline Phosphatase: 83 IU/L (ref 44–121)
BUN/Creatinine Ratio: 11 (ref 10–24)
BUN: 18 mg/dL (ref 8–27)
Bilirubin Total: 0.6 mg/dL (ref 0.0–1.2)
CO2: 24 mmol/L (ref 20–29)
Calcium: 10.7 mg/dL — ABNORMAL HIGH (ref 8.6–10.2)
Chloride: 99 mmol/L (ref 96–106)
Creatinine, Ser: 1.58 mg/dL — ABNORMAL HIGH (ref 0.76–1.27)
Globulin, Total: 2.6 g/dL (ref 1.5–4.5)
Glucose: 114 mg/dL — ABNORMAL HIGH (ref 65–99)
Potassium: 4.2 mmol/L (ref 3.5–5.2)
Sodium: 143 mmol/L (ref 134–144)
Total Protein: 7.3 g/dL (ref 6.0–8.5)
eGFR: 49 mL/min/{1.73_m2} — ABNORMAL LOW (ref >59)

## 2021-02-11 ENCOUNTER — Other Ambulatory Visit: Payer: Self-pay | Admitting: Family Medicine

## 2021-02-11 DIAGNOSIS — N179 Acute kidney failure, unspecified: Secondary | ICD-10-CM

## 2021-02-14 ENCOUNTER — Encounter: Payer: Self-pay | Admitting: *Deleted

## 2021-02-14 NOTE — Progress Notes (Signed)
Mychart message sent.

## 2021-02-16 ENCOUNTER — Other Ambulatory Visit: Payer: Self-pay

## 2021-02-16 ENCOUNTER — Other Ambulatory Visit (INDEPENDENT_AMBULATORY_CARE_PROVIDER_SITE_OTHER): Payer: Managed Care, Other (non HMO)

## 2021-02-16 DIAGNOSIS — N179 Acute kidney failure, unspecified: Secondary | ICD-10-CM

## 2021-02-17 LAB — BASIC METABOLIC PANEL
BUN/Creatinine Ratio: 14 (ref 10–24)
BUN: 16 mg/dL (ref 8–27)
CO2: 25 mmol/L (ref 20–29)
Calcium: 10.3 mg/dL — ABNORMAL HIGH (ref 8.6–10.2)
Chloride: 98 mmol/L (ref 96–106)
Creatinine, Ser: 1.16 mg/dL (ref 0.76–1.27)
Glucose: 97 mg/dL (ref 65–99)
Potassium: 4 mmol/L (ref 3.5–5.2)
Sodium: 143 mmol/L (ref 134–144)
eGFR: 71 mL/min/{1.73_m2} (ref >59)

## 2021-02-17 LAB — PARATHYROID HORMONE, INTACT (NO CA): PTH: 17 pg/mL (ref 15–65)

## 2021-02-20 ENCOUNTER — Other Ambulatory Visit: Payer: Self-pay | Admitting: Family Medicine

## 2021-02-23 ENCOUNTER — Other Ambulatory Visit (INDEPENDENT_AMBULATORY_CARE_PROVIDER_SITE_OTHER): Payer: Managed Care, Other (non HMO)

## 2021-02-23 ENCOUNTER — Other Ambulatory Visit: Payer: Self-pay

## 2021-03-04 LAB — VITAMIN D 1,25 DIHYDROXY
Vitamin D 1, 25 (OH)2 Total: 63 pg/mL
Vitamin D2 1, 25 (OH)2: <10 pg/mL
Vitamin D3 1, 25 (OH)2: 63 pg/mL

## 2021-03-04 LAB — PTH-RELATED PEPTIDE: PTH-related peptide: <2 pmol/L

## 2021-03-04 LAB — VITAMIN D 25 HYDROXY (VIT D DEFICIENCY, FRACTURES): Vit D, 25-Hydroxy: 37.9 ng/mL (ref 30.0–100.0)

## 2021-03-09 ENCOUNTER — Other Ambulatory Visit: Payer: Self-pay | Admitting: Family Medicine

## 2021-03-09 DIAGNOSIS — I10 Essential (primary) hypertension: Secondary | ICD-10-CM

## 2021-03-09 MED ORDER — LOSARTAN POTASSIUM 100 MG PO TABS: 100.0000 mg | ORAL_TABLET | Freq: Every day | ORAL | 3 refills | Status: DC

## 2021-03-23 LAB — BASIC METABOLIC PANEL
BUN/Creatinine Ratio: 20 (ref 10–24)
BUN: 19 mg/dL (ref 8–27)
CO2: 21 mmol/L (ref 20–29)
Calcium: 9.8 mg/dL (ref 8.6–10.2)
Chloride: 103 mmol/L (ref 96–106)
Creatinine, Ser: 0.93 mg/dL (ref 0.76–1.27)
Glucose: 74 mg/dL (ref 65–99)
Potassium: 4.1 mmol/L (ref 3.5–5.2)
Sodium: 144 mmol/L (ref 134–144)
eGFR: 92 mL/min/{1.73_m2} (ref >59)

## 2021-03-24 ENCOUNTER — Encounter: Payer: Self-pay | Admitting: Gastroenterology

## 2021-03-24 ENCOUNTER — Ambulatory Visit: Payer: Managed Care, Other (non HMO) | Admitting: Gastroenterology

## 2021-03-24 VITALS — BP 162/70 | HR 84 | Ht 68.0 in | Wt 191.1 lb

## 2021-03-24 DIAGNOSIS — K760 Fatty (change of) liver, not elsewhere classified: Secondary | ICD-10-CM | POA: Diagnosis not present

## 2021-03-24 DIAGNOSIS — Z86010 Personal history of colonic polyps: Secondary | ICD-10-CM

## 2021-03-24 DIAGNOSIS — K219 Gastro-esophageal reflux disease without esophagitis: Secondary | ICD-10-CM | POA: Diagnosis not present

## 2021-03-24 DIAGNOSIS — K5520 Angiodysplasia of colon without hemorrhage: Secondary | ICD-10-CM | POA: Diagnosis not present

## 2021-03-24 DIAGNOSIS — K2270 Barrett's esophagus without dysplasia: Secondary | ICD-10-CM

## 2021-03-24 NOTE — Progress Notes (Signed)
Collin Mills    [id number]    August 31, 1956  Primary Care Physician:Sonnenberg, Angela Adam, MD  Referring Physician: Leone Haven, MD 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  He is currently following keto diet and is avoiding all carbs and sugars. LFTs have improved significantly, transaminases have trended to normal range. Hepatic Function Latest Ref Rng & Units 02/09/2021 01/28/2021 09/27/2020  Total Protein 6.0 - 8.5 g/dL 7.3 7.3 7.1  Albumin 3.8 - 4.8 g/dL 4.7 4.9(H) 4.7  AST 0 - 40 IU/L 22 22 20   ALT 0 - 44 IU/L 21 25 30   Alk Phosphatase 44 - 121 IU/L 83 74 73  Total Bilirubin 0.0 - 1.2 mg/dL 0.6 0.5 0.7  Bilirubin, Direct 0.00 - 0.40 mg/dL - - 0.15   Denies any nausea, vomiting, abdominal pain, melena or bright red blood per rectum  Iron/TIBC/Ferritin/ %Sat    Component Value Date/Time   IRON 89 07/30/2020 0717   TIBC 311 07/30/2020 0717   FERRITIN 232 07/30/2020 0717   IRONPCTSAT 29 07/30/2020 0717     EGD November 22, 2018 showed long segment Barrett's esophagus, hiatal hernia otherwise unremarkable exam. Biopsy showed Barrett's esophagus with no dysplasia. Colonoscopy November 22, 2018 with removal of 4 tubular adenomas and 1 hyperplastic polyp. Internal hemorrhoids Capsule endoscopy December 19, 2018 showed few small nonbleeding AVM and small intestine and one small erosion. No active bleeding.   Outpatient Encounter Medications as of 03/24/2021  Medication Sig  . atorvastatin (LIPITOR) 80 MG tablet TAKE 1 TABLET(80 MG) BY MOUTH DAILY  . celecoxib (CELEBREX) 200 MG capsule Take 200 mg by mouth daily.  . fluticasone (FLONASE) 50 MCG/ACT nasal  spray Place into both nostrils daily.  Marland Kitchen glucose blood test strip Check once daily, diagnosis code E11.9, dispense based on patient and insurance preferences  . hydrocortisone (ANUSOL-HC) 25 MG suppository Place 1 suppository (25 mg total) rectally at bedtime.  Marland Kitchen losartan (COZAAR) 100 MG tablet Take 1 tablet (100 mg total) by mouth daily.  . metFORMIN (GLUCOPHAGE) 500 MG tablet Take 1 tablet (500 mg total) by mouth 2 (two) times daily with a meal. (Patient taking differently: Take 500 mg by mouth daily with breakfast.)  . omeprazole (PRILOSEC) 40 MG capsule TAKE 1 CAPSULE(40 MG) BY MOUTH DAILY   No facility-administered encounter medications on file as of 03/24/2021.    Allergies as of 03/24/2021 - Review Complete 03/24/2021  Allergen Reaction Noted  . Molds & smuts  07/15/2018    Past Medical History:  Diagnosis Date  . Allergic rhinitis   . Allergy   . Anemia    giving blood every six weeks  . Barrett's esophagus   . Chickenpox   . Colon polyps   . GERD (gastroesophageal reflux disease)   . Hyperlipidemia   . Hypertension   . Iron deficiency anemia 11/11/2018  . Migraines   . Osteoarthritis    osteoarthritis    Past Surgical History:  Procedure Laterality Date  . COLONOSCOPY    . ESOPHAGOGASTRODUODENOSCOPY ENDOSCOPY      Family History  Problem Relation Age of Onset  . Lung cancer Mother   . COPD Mother   .  Lung cancer Father   . Stroke Father   . Spina bifida Sister   . Cirrhosis Brother   . Colon cancer Neg Hx   . Esophageal cancer Neg Hx   . Rectal cancer Neg Hx   . Stomach cancer Neg Hx     Social History   Socioeconomic History  . Marital status: Married    Spouse name: Not on file  . Number of children: 1  . Years of education: Not on file  . Highest education level: Not on file  Occupational History  . Not on file  Tobacco Use  . Smoking status: Former Smoker    Packs/day: 2.00    Years: 36.00    Pack years: 72.00    Quit date: 2004    Years  since quitting: 18.4  . Smokeless tobacco: Never Used  Vaping Use  . Vaping Use: Never used  Substance and Sexual Activity  . Alcohol use: Yes    Alcohol/week: 5.0 standard drinks    Types: 5 Shots of liquor per week    Comment: occasional  . Drug use: No  . Sexual activity: Not on file  Other Topics Concern  . Not on file  Social History Narrative  . Not on file   Social Determinants of Health   Financial Resource Strain: Not on file  Food Insecurity: Not on file  Transportation Needs: Not on file  Physical Activity: Not on file  Stress: Not on file  Social Connections: Not on file  Intimate Partner Violence: Not on file      Review of systems: All other review of systems negative except as mentioned in the HPI.   Physical Exam: Vitals:   03/24/21 0825  BP: (!) 162/70  Pulse: 84   Body mass index is 29.06 kg/m. Gen:      No acute distress Neuro: alert and oriented x 3 Psych: normal mood and affect  Data Reviewed:  Reviewed labs, radiology imaging, old records and pertinent past GI work up   Assessment and Plan/Recommendations:  65 year old very pleasant gentleman with history of iron deficiency anemia secondary to chronic GI occult blood loss with small bowel AVMs and elevated transaminases secondary to fatty liver  Fatty liver: LFT trended down to normal range with dietary modification Continue with low-carb diet and exercise Monitor LFT every 6 months through PMD  Iron deficiency improved: Continue to monitor CBC and iron panel every 6 months through PMD Continue to avoid NSAIDs   GERD and Barrett's esophagus with no dysplasia: Continue omeprazole 40 mg daily and antireflux measures  Due for surveillance EGD for Barrett's esophagus and colonoscopy for adenomatous colon polyps in January 2023  Return as needed  The patient was provided an opportunity to ask questions and all were answered. The patient agreed with the plan and demonstrated an  understanding of the instructions.  Damaris Hippo , MD    CC: Leone Haven, MD

## 2021-03-24 NOTE — Patient Instructions (Signed)
Follow up as needed  If you are age 64 or older, your body mass index should be between 23-30. Your Body mass index is 29.06 kg/m. If this is out of the aforementioned range listed, please consider follow up with your Primary Care Provider.  If you are age 32 or younger, your body mass index should be between 19-25. Your Body mass index is 29.06 kg/m. If this is out of the aformentioned range listed, please consider follow up with your Primary Care Provider.   __________________________________________________________  The [facility] providers would like to encourage you to use [facility] to communicate with providers for non-urgent requests or questions.  Due to long hold times on the telephone, sending your provider a message by [facility] may be a faster and more efficient way to get a response.  Please allow 48 business hours for a response.  Please remember that this is for non-urgent requests.   I appreciate the  opportunity to care for you  Thank You   Harl Bowie , MD

## 2021-04-29 ENCOUNTER — Telehealth: Payer: Self-pay | Admitting: Family Medicine

## 2021-04-29 NOTE — Telephone Encounter (Signed)
I called and spoke with the patient and informed him that he does not needs lab, he asked about the A1C and I informed him that we would do a POC for his A1C in office and he understood. Tabithia Stroder,cma

## 2021-04-29 NOTE — Telephone Encounter (Signed)
Patient called and wanted to know if he needed labs before his 05/04/21 appointment with Dr. Caryl Bis. If so please send orders to [facility] and call him.

## 2021-05-04 ENCOUNTER — Ambulatory Visit: Payer: Managed Care, Other (non HMO) | Admitting: Family Medicine

## 2021-05-04 ENCOUNTER — Other Ambulatory Visit: Payer: Self-pay

## 2021-05-04 ENCOUNTER — Encounter: Payer: Self-pay | Admitting: Family Medicine

## 2021-05-04 ENCOUNTER — Ambulatory Visit
Admission: EM | Admit: 2021-05-04 | Discharge: 2021-05-04 | Disposition: A | Discharge status: Home / Self Care | Payer: Managed Care, Other (non HMO) | Attending: Emergency Medicine | Admitting: Emergency Medicine

## 2021-05-04 ENCOUNTER — Encounter: Payer: Self-pay | Admitting: Emergency Medicine

## 2021-05-04 DIAGNOSIS — R519 Headache, unspecified: Secondary | ICD-10-CM | POA: Insufficient documentation

## 2021-05-04 DIAGNOSIS — I10 Essential (primary) hypertension: Secondary | ICD-10-CM | POA: Insufficient documentation

## 2021-05-04 MED ORDER — AMLODIPINE BESYLATE 5 MG PO TABS: 5.0000 mg | ORAL_TABLET | Freq: Every day | ORAL | 0 refills | Status: DC

## 2021-05-04 NOTE — Discharge Instructions (Addendum)
Your blood pressure today was 185/82; Repeat 168/95.    Restart amlodipine 5 mg by mouth daily.  Keep a log of your daily blood pressure readings.    Keep your appointment with your primary care provider next week.    Go to the emergency department if you have any acute symptoms.

## 2021-05-04 NOTE — Telephone Encounter (Signed)
I called patient to triage. Pt is having no CP, no SOB, vision changes & does have HA. His HA comes & goes with the high BP. He says this isn't new. I advised him with such a high diastolic BP that he needed to be seen ASAP. Patient was agreeable to UC. I provided him with location & wait time of urgent care across from [place]. I instructed him to call back once he was seen to see who in our office could see him for BP f/u. Pt verbalized understanding of this.

## 2021-05-04 NOTE — ED Provider Notes (Signed)
[name]    CSN: [id number] Arrival date & time: 05/04/21  1204      History   Chief Complaint Chief Complaint  Patient presents with   Hypertension    HPI Collin Mills is a 65 y.o. male.  Patient presents with elevated blood pressure readings at home x 1 month.  170-180/100-110.  He reports intermittent headaches.  He denies chest pain, shortness of breath, focal weakness, dizziness, or other symptoms.  His blood pressure medication was changed by his PCP 2 months ago; he lost a considerable amount of weight and his blood pressure decreased.  He used to take amlodipine, HCTZ, and losartan; he is currently only taking losartan.  He contacted his PCPs office today and was instructed to come to urgent care because his diastolic blood pressure was too high.  His medical history includes diabetes, hypertension, cerebral aneurysm, anemia, Barrett's esophagus, GERD, migraine headaches, osteoarthritis.  The history is provided by the patient and medical records.   Past Medical History:  Diagnosis Date   Allergic rhinitis    Allergy    Anemia    giving blood every six weeks   Barrett's esophagus    Chickenpox    Colon polyps    GERD (gastroesophageal reflux disease)    Hyperlipidemia    Hypertension    Iron deficiency anemia 11/11/2018   Migraines    Osteoarthritis    osteoarthritis    Patient Active Problem List   Diagnosis Date Noted   Hypercalcemia 01/31/2021   Right shoulder pain 11/02/2020   Right elbow pain 12/05/2019   Elevated LFTs 06/16/2019   Eosinophilia 04/08/2019   Hx of adenomatous colonic polyps 11/13/2018   Iron deficiency anemia 11/11/2018   Diabetes mellitus without complication ([place]) 61/95/0932   Rotator cuff impingement syndrome of left shoulder 01/21/2018   Cerebral aneurysm 06/13/2017   Dizziness 05/23/2017   Change in vision 05/23/2017   Obesity 08/02/2016   Osteoarthritis 12/28/2015   Allergic rhinitis 12/28/2015   Essential  hypertension 12/28/2015   Hyperlipidemia 12/28/2015    Past Surgical History:  Procedure Laterality Date   COLONOSCOPY     ESOPHAGOGASTRODUODENOSCOPY ENDOSCOPY         Home Medications    Prior to Admission medications   Medication Sig Start Date End Date Taking? Authorizing Provider  amLODipine (NORVASC) 5 MG tablet Take 1 tablet (5 mg total) by mouth daily. 05/04/21  Yes Sharion Balloon, NP  atorvastatin (LIPITOR) 80 MG tablet TAKE 1 TABLET(80 MG) BY MOUTH DAILY 02/07/21  Yes Leone Haven, MD  celecoxib (CELEBREX) 200 MG capsule Take 200 mg by mouth daily. 02/07/21  Yes [provider]  losartan (COZAAR) 100 MG tablet Take 1 tablet (100 mg total) by mouth daily. 03/09/21  Yes Leone Haven, MD  fluticasone [facility]) 50 MCG/ACT nasal spray Place into both nostrils daily.    [provider]  glucose blood test strip Check once daily, diagnosis code E11.9, dispense based on patient and insurance preferences 01/31/21   Leone Haven, MD  hydrocortisone (ANUSOL-HC) 25 MG suppository Place 1 suppository (25 mg total) rectally at bedtime. 01/13/20   Mauri Pole, MD  metFORMIN (GLUCOPHAGE) 500 MG tablet Take 1 tablet (500 mg total) by mouth 2 (two) times daily with a meal. Patient taking differently: Take 500 mg by mouth daily with breakfast. 08/12/20   Leone Haven, MD  omeprazole (PRILOSEC) 40 MG capsule TAKE 1 CAPSULE(40 MG) BY MOUTH DAILY 08/17/20  Leone Haven, MD    Family History Family History  Problem Relation Age of Onset   Lung cancer Mother    COPD Mother    Lung cancer Father    Stroke Father    Spina bifida Sister    Cirrhosis Brother    Colon cancer Neg Hx    Esophageal cancer Neg Hx    Rectal cancer Neg Hx    Stomach cancer Neg Hx     Social History Social History   Tobacco Use   Smoking status: Former    Packs/day: 2.00    Years: 36.00    Pack years: 72.00    Types: Cigarettes    Quit date: 2004    Years  since quitting: 18.5   Smokeless tobacco: Never  Vaping Use   Vaping Use: Never used  Substance Use Topics   Alcohol use: Yes    Alcohol/week: 5.0 standard drinks    Types: 5 Shots of liquor per week    Comment: occasional   Drug use: No     Allergies   Molds & smuts   Review of Systems Review of Systems  Constitutional:  Negative for chills and fever.  Respiratory:  Negative for cough and shortness of breath.   Cardiovascular:  Negative for chest pain and palpitations.  Neurological:  Positive for headaches. Negative for dizziness, tremors, seizures, syncope, facial asymmetry, speech difficulty, weakness, light-headedness and numbness.  All other systems reviewed and are negative.   Physical Exam Triage Vital Signs ED Triage Vitals  Enc Vitals Group     BP      Pulse      Resp      Temp      Temp src      SpO2      Weight      Height      Head Circumference      Peak Flow      Pain Score      Pain Loc      Pain Edu?      Excl. in [place]?    No data found.  Updated Vital Signs BP (!) 168/95 (BP Location: Left Arm)   Pulse 75   Temp 98.8 F (37.1 C) (Oral)   Resp 15   SpO2 94%   Visual Acuity Right Eye Distance:   Left Eye Distance:   Bilateral Distance:    Right Eye Near:   Left Eye Near:    Bilateral Near:     Physical Exam Vitals and nursing note reviewed.  Constitutional:      General: He is not in acute distress.    Appearance: He is well-developed. He is not ill-appearing.  HENT:     Head: Normocephalic and atraumatic.     Mouth/Throat:     Mouth: Mucous membranes are moist.  Eyes:     Conjunctiva/sclera: Conjunctivae normal.  Cardiovascular:     Rate and Rhythm: Normal rate and regular rhythm.     Heart sounds: Normal heart sounds.  Pulmonary:     Effort: Pulmonary effort is normal. No respiratory distress.     Breath sounds: Normal breath sounds.  Abdominal:     Palpations: Abdomen is soft.     Tenderness: There is no abdominal  tenderness.  Musculoskeletal:     Cervical back: Neck supple.     Right lower leg: No edema.     Left lower leg: No edema.  Skin:    General: Skin is warm  and dry.  Neurological:     General: No focal deficit present.     Mental Status: He is alert and oriented to person, place, and time.     Cranial Nerves: No cranial nerve deficit.     Sensory: No sensory deficit.     Motor: No weakness.     Gait: Gait normal.  Psychiatric:        Mood and Affect: Mood normal.        Behavior: Behavior normal.     UC Treatments / Results  Labs (all labs ordered are listed, but only abnormal results are displayed) Labs Reviewed - No data to display  EKG   Radiology No results found.  Procedures Procedures (including critical care time)  Medications Ordered in UC Medications - No data to display  Initial Impression / Assessment and Plan / UC Course  I have reviewed the triage vital signs and the nursing notes.  Pertinent labs & imaging results that were available during my care of the patient were reviewed by me and considered in my medical decision making (see chart for details).  Elevated blood pressure reading with known hypertension, acute non-intractable headache.  Patient is well-appearing and his exam is reassuring.  EKG shows sinus rhythm, rate 65, no ST elevation, compared to previous from 2018.  Patient has a log of his blood pressure readings from the past month on his phone; his blood pressure has been consistently high.  Restarting amlodipine today.  Patient has an appointment with his PCP next week.  Instructed him to continue keeping a record of his blood pressure daily to take with him to his appointment with his PCP next week.  Education provided on managing hypertension.  ED precautions discussed.  Patient agrees to plan of care.   Final Clinical Impressions(s) / UC Diagnoses   Final diagnoses:  Elevated blood pressure reading in office with diagnosis of hypertension   Acute nonintractable headache, unspecified headache type     Discharge Instructions      Your blood pressure today was 185/82; Repeat 168/95.    Restart amlodipine 5 mg by mouth daily.  Keep a log of your daily blood pressure readings.    Keep your appointment with your primary care provider next week.    Go to the emergency department if you have any acute symptoms.         ED Prescriptions     Medication Sig Dispense Auth. Provider   amLODipine (NORVASC) 5 MG tablet Take 1 tablet (5 mg total) by mouth daily. 30 tablet Sharion Balloon, NP      PDMP not reviewed this encounter.   Sharion Balloon, NP 05/04/21 1316

## 2021-05-04 NOTE — ED Triage Notes (Signed)
Patient c/o hypertension x 1 month.   Patient endorses a BP 183/105 this morning. Patient endorses a BP 171/155 yesterday this morning. Patient endorses his BP runs higher in the morning.   Patient endorses having a medication change 8-10 weeks. Patient endorses being placed on 100 mg of Losartan in replacement of a previous regimen where he was taking 3 different medications.   Patient denies SOB, Chest Pain, and Dizziness.   Patient endorses headache at times.   Patient has history of HTN.

## 2021-05-04 NOTE — Telephone Encounter (Signed)
Pt's wife called in and states that her husband's BP is running high-please see MyChart message; I was going to transfer to Access Nurse but pt was not with her. She states that she will get him to call back. FYI

## 2021-05-06 ENCOUNTER — Other Ambulatory Visit: Payer: Self-pay | Admitting: Family Medicine

## 2021-05-12 ENCOUNTER — Other Ambulatory Visit: Payer: Self-pay

## 2021-05-12 ENCOUNTER — Encounter: Payer: Self-pay | Admitting: Family Medicine

## 2021-05-12 ENCOUNTER — Ambulatory Visit: Payer: Managed Care, Other (non HMO) | Admitting: Family Medicine

## 2021-05-12 DIAGNOSIS — E6609 Other obesity due to excess calories: Secondary | ICD-10-CM

## 2021-05-12 DIAGNOSIS — M7521 Bicipital tendinitis, right shoulder: Secondary | ICD-10-CM | POA: Insufficient documentation

## 2021-05-12 DIAGNOSIS — Z6830 Body mass index (BMI) 30.0-30.9, adult: Secondary | ICD-10-CM

## 2021-05-12 DIAGNOSIS — E785 Hyperlipidemia, unspecified: Secondary | ICD-10-CM

## 2021-05-12 DIAGNOSIS — E119 Type 2 diabetes mellitus without complications: Secondary | ICD-10-CM | POA: Diagnosis not present

## 2021-05-12 DIAGNOSIS — I10 Essential (primary) hypertension: Secondary | ICD-10-CM

## 2021-05-12 MED ORDER — AMLODIPINE BESYLATE 10 MG PO TABS: 10.0000 mg | ORAL_TABLET | Freq: Every day | ORAL | 1 refills | Status: DC

## 2021-05-12 NOTE — Assessment & Plan Note (Signed)
Above goal.  We will increase amlodipine to 10 mg once daily.  He will continue losartan 100 mg daily.  Follow-up in 4 to 6 weeks.

## 2021-05-12 NOTE — Progress Notes (Signed)
Collin Rumps, MD Phone: [phone]  Collin Mills is a 65 y.o. male who presents today for f/u.  HYPERTENSION Disease Monitoring: Blood pressure range-150s-190s/60s-100s, recently restarted amlodipine and BPs trended to 114-160s/60s-90s Chest pain- no      Dyspnea- no Medications: Compliance- taking losartan, amlodipine   Edema- no  DIABETES Disease Monitoring: Blood Sugar ranges-<100 Polyuria/phagia/dipsia- no No medications.  HYPERLIPIDEMIA Disease Monitoring: See symptoms for Hypertension Medications: Compliance- taking lipitor Right upper quadrant pain- no  Muscle aches- no  Obesity: Patient has cleaned up his diet a little bit.  He is eating less of the high-fat foods.  He is eating a little more carbohydrates and greens.  He is exercising 3 times a week with weights and irritable classes.  Right anterior shoulder pain: This has been going on for least 6 months.  He did physical therapy for his shoulder previously and it got 80% better though this seems to be a little different and is more towards the top of his bicep.  Hurts if he does a pull-up though he can do pretty much anything else with his right arm.  No specific injury.    Social History   Tobacco Use  Smoking Status Former   Packs/day: 2.00   Years: 36.00   Pack years: 72.00   Types: Cigarettes   Quit date: 2004   Years since quitting: 18.5  Smokeless Tobacco Never    Current Outpatient Medications on File Prior to Visit  Medication Sig Dispense Refill   atorvastatin (LIPITOR) 80 MG tablet TAKE 1 TABLET(80 MG) BY MOUTH DAILY 90 tablet 1   celecoxib (CELEBREX) 200 MG capsule Take 200 mg by mouth daily.     fluticasone (FLONASE) 50 MCG/ACT nasal spray Place into both nostrils daily.     glucose blood test strip Check once daily, diagnosis code E11.9, dispense based on patient and insurance preferences 100 each 12   hydrocortisone (ANUSOL-HC) 25 MG suppository Place 1 suppository (25 mg total)  rectally at bedtime. 30 suppository 3   losartan (COZAAR) 100 MG tablet Take 1 tablet (100 mg total) by mouth daily. 90 tablet 3   omeprazole (PRILOSEC) 40 MG capsule TAKE 1 CAPSULE(40 MG) BY MOUTH DAILY 90 capsule 3   metFORMIN (GLUCOPHAGE) 500 MG tablet Take 1 tablet (500 mg total) by mouth 2 (two) times daily with a meal. (Patient not taking: Reported on 05/12/2021) 180 tablet 3   No current facility-administered medications on file prior to visit.     ROS see history of present illness  Objective  Physical Exam Vitals:   05/12/21 0905  BP: (!) 142/68  Pulse: 72  Temp: 97.6 F (36.4 C)  SpO2: 97%    BP Readings from Last 3 Encounters:  05/12/21 (!) 142/68  05/04/21 (!) 168/95  03/24/21 (!) 162/70   Wt Readings from Last 3 Encounters:  05/12/21 186 lb 3.2 oz (84.5 kg)  03/24/21 191 lb 2 oz (86.7 kg)  01/31/21 199 lb 3.2 oz (90.4 kg)    Physical Exam Constitutional:      General: He is not in acute distress.    Appearance: He is not diaphoretic.  Cardiovascular:     Rate and Rhythm: Normal rate and regular rhythm.     Heart sounds: Normal heart sounds.  Pulmonary:     Effort: Pulmonary effort is normal.     Breath sounds: Normal breath sounds.  Musculoskeletal:     Right lower leg: No edema.     Left lower leg:  No edema.     Comments: Slight tenderness over the biceps tendon origination of his right shoulder, no other tenderness in his right shoulder, positive speeds on the right, negative speeds on the left, negative empty can bilaterally  Skin:    General: Skin is warm and dry.  Neurological:     Mental Status: He is alert.     Comments: 5/5 strength bilateral biceps, triceps, and grip     Assessment/Plan: Please see individual problem list.  Problem List Items Addressed This Visit     Biceps tendinitis of right shoulder    Exam and symptoms are consistent with biceps tendinitis.  He was provided with exercises to complete at home.  If these are not  beneficial he will let us know and we could consider physical therapy or referral to a specialist.  Advised against NSAID use given his blood pressure issues.       Diabetes mellitus without complication ([place])    Incredibly well controlled.  We will check an A1c.  He may have reversed his diabetes with diet and exercise.       Relevant Orders   HgB A1c   Essential hypertension    Above goal.  We will increase amlodipine to 10 mg once daily.  He will continue losartan 100 mg daily.  Follow-up in 4 to 6 weeks.       Relevant Medications   amLODipine (NORVASC) 10 MG tablet   Hyperlipidemia    Taking Lipitor.  We will recheck his cholesterol.       Relevant Medications   amLODipine (NORVASC) 10 MG tablet   Other Relevant Orders   Lipid panel   Obesity    Weight continues to trend down.  He will continue with diet and exercise changes.        Return in about 6 weeks (around 06/23/2021) for HTN.  This visit occurred during the SARS-CoV-2 public health emergency.  Safety protocols were in place, including screening questions prior to the visit, additional usage of staff PPE, and extensive cleaning of exam room while observing appropriate contact time as indicated for disinfecting solutions.    Collin Rumps, MD [place]

## 2021-05-12 NOTE — Assessment & Plan Note (Signed)
Exam and symptoms are consistent with biceps tendinitis.  He was provided with exercises to complete at home.  If these are not beneficial he will let us know and we could consider physical therapy or referral to a specialist.  Advised against NSAID use given his blood pressure issues.

## 2021-05-12 NOTE — Assessment & Plan Note (Signed)
Taking Lipitor.  We will recheck his cholesterol.

## 2021-05-12 NOTE — Assessment & Plan Note (Signed)
Incredibly well controlled.  We will check an A1c.  He may have reversed his diabetes with diet and exercise.

## 2021-05-12 NOTE — Patient Instructions (Signed)
Nice to see you. Please continue to keep track of your blood pressure. We are going to increase your amlodipine to 10 mg once daily.  If you start to notice lightheadedness or your blood pressure dropping to 100/60 or less please let me know. We will contact you with your lab results. Please do the exercises for your shoulder.  Proximal Biceps Tendinitis and Tenosynovitis Rehab Ask your health care provider which exercises are safe for you. Do exercises exactly as told by your health care provider and adjust them as directed. It is normal to feel mild stretching, pulling, tightness, or discomfort as you do these exercises. Stop right away if you feel sudden pain or your pain gets worse. Do not begin these exercises until told by your health care provider. Stretching and range-of-motion exercises These exercises warm up your muscles and joints and improve the movement and flexibility of your arm and shoulder. The exercises also help to relieve painand stiffness. Forearm rotation Stand or sit with your left / right elbow bent in a 90-degree (right angle). Position your forearm so that the thumb is facing the ceiling (neutral position). Rotate your palm up until it cannot go any farther. Hold this position for __________ seconds. Rotate your palm down until it cannot go any farther. Hold this position for __________ seconds. Repeat __________ times. Complete this exercise __________ times a day. Elbow range of motion Stand or sit with your left / right elbow bent in a 90-degree angle (right angle). Position your forearm so that the thumb is facing the ceiling (neutral position). Slowly bend your elbow as far as you can until you feel a stretch or cannot go any farther. Hold this position for __________ seconds. Slowly straighten your elbow as far as you can until you feel a stretch or cannot go any farther. Hold this position for __________ seconds. Repeat __________ times. Complete this exercise  __________ times a day. Biceps stretch Stand facing a wall, or stand by a door frame. Raise your left / right arm out to your side, to your shoulder height. Place the thumb side of your hand against the wall. Your palm should be facing the floor (palm down). Keeping your arm straight, rotate your body in the opposite direction of the raised arm until you feel a gentle stretch in your biceps. Hold this position for __________ seconds. Slowly return to the starting position. Repeat __________ times. Complete this exercise __________ times a day. Shoulder pendulum  Stand near a table or counter that you can hold onto for balance. Bend forward at the waist and let your left / right arm hang straight down. Use your other arm to support you and help you stay balanced. Relax your left / right arm and shoulder muscles, and move your hips and your trunk so your left / right arm swings freely. Your arm should swing because of the motion of your body, not because you are using your arm or shoulder muscles. Keep moving your hips and trunk so your arm swings in the following directions, as told by your health care provider: Side to side. Forward and backward. In clockwise and counterclockwise circles. Repeat __________ times. Complete this exercise __________ times a day. Shoulder flexion, assisted  Stand facing a wall. Put your left / right palm on the wall. Slowly move your left / right hand up the wall (flexion). Stop when you feel a stretch in your shoulder, or when you reach the angle that is recommended by your  health care provider. Use your other hand to help raise your arm, if needed (assisted). As your hand gets higher, you may need to step closer to the wall. Avoid shrugging or lifting your shoulder up as you raise your arm. To do this, keep your shoulder blade tucked down toward your spine. Hold this position for __________ seconds. Slowly return to the starting position. Use your other arm  to help, if needed. Repeat __________ times. Complete this exercise __________ times a day. Shoulder flexion Stand with your left / right arm hanging down at your side. Keep your arm straight as you lift your arm forward and toward the ceiling (flexion). Hold this position for __________ seconds. Slowly return to the starting position. Repeat __________ times. Complete this exercise __________ times a day. Sleeper stretch, assisted Lie on your left / right side (injured side) with your hips and knees bent and your left / right arm straight in front of you. Bend your elbow to a 90-degree angle (right angle), so your fingers are pointing to the ceiling. Use your other hand to gently push your arm toward the floor (assisted), stopping when you feel a gentle stretch. Keep your shoulder blades lightly squeezed together during the exercise. Hold this position for __________ seconds. Slowly return to the starting position. Repeat __________ times. Complete this exercise __________ times a day. Strengthening exercises These exercises build strength and endurance in your arm and shoulder. Endurance is the ability to use your muscles for a long time, even after theyget tired. Biceps curls You can use a weight or an exercise band for this exercise. Sit on a stable chair without armrests, or stand up. Hold a __________ weight in your left / right hand, or hold an exercise band with both hands. Your palms should face up toward the ceiling at the starting position. Bend your left / right elbow and move your hand up toward your shoulder. Keep your other arm straight down, in the starting position. Hold this position for __________ seconds. Slowly return to the starting position. Repeat __________ times. Complete this exercise __________ times a day. Internal shoulder rotation You will use an exercise band secured to a stable object at waist height for this exercise. A door and doorframe work well. Stand  sideways next to a door with your left / right arm closest to the door, holding the exercise band in your hand. With your elbow bent in a 90-degree angle (right angle) and keeping your elbow at your side, bring your hand toward your belly (internal rotation). Make sure your wrist is staying straight as you do this exercise. Hold this position for __________ seconds. Slowly return to the starting position. Repeat __________ times. Complete this exercise __________ times a day. External shoulder rotation You will use an exercise band secured to a stable object at waist height for this exercise. A door and doorframe work well. Stand sideways next to a door with your left / right arm away from the door, holding the exercise band in your hand. With your elbow bent in a 90-degree angle (right angle) and keeping your elbow at your side, swing your arm away from your body (external rotation). Make sure your wrist is staying straight as you do this exercise. Hold this position for __________ seconds. Slowly return to the starting position. Repeat __________ times. Complete this exercise __________ times a day. External shoulder rotation, side-lying You will use a weight to do this exercise. Lie on your uninjured side with your  left / right arm at your side. Bend your elbow to a 90-degree angle (right angle). Hold a __________ weight in your left / right hand. Keeping your elbow at your side, raise your arm toward the ceiling (external rotation). Make sure your wrist is staying straight as you do this exercise. Hold this position for __________ seconds. Slowly return to the starting position. Repeat __________ times. Complete this exercise __________ times a day. Scapular retraction Scapular retraction is the process of pulling the shoulder blades (scapulae) toward each other, and toward the spine. You will need an exercise band to do this exercise. Sit in a stable chair without armrests, or stand  up. Secure an exercise band to a stable object in front of you so the band is at shoulder height. Hold one end of the exercise band in each hand. Squeeze your shoulder blades together and move your elbows slightly behind you (retraction). Do not shrug your shoulders upward while you do this. Hold this position for __________ seconds. Slowly return to the starting position. Repeat __________ times. Complete this exercise __________ times a day. Scapular protraction, supine Scapular protraction is the process of moving your shoulder blades away from each other, and away from the spine, while you lie on your back (supine position). Lie on your back on a firm surface. Hold a __________ weight in your left / right hand. Raise your left / right arm straight into the air so your hand is directly above your shoulder joint. Push the weight into the air so your shoulder (scapula) lifts off the surface that you are lying on. Think of trying to punch the ceiling by only moving your scapula forward (protraction). Do not move your head, neck, or back. Hold this position for __________ seconds. Slowly return to the starting position. Repeat __________ times. Complete this exercise __________ times a day. This information is not intended to replace advice given to you by your health care provider. Make sure you discuss any questions you have with your healthcare provider. Document Revised: 02/04/2019 Document Reviewed: 10/21/2018 Elsevier Patient Education  [place].

## 2021-05-12 NOTE — Assessment & Plan Note (Signed)
Weight continues to trend down.  He will continue with diet and exercise changes.

## 2021-05-13 LAB — LIPID PANEL
Chol/HDL Ratio: 2.8 ratio (ref 0.0–5.0)
Cholesterol, Total: 176 mg/dL (ref 100–199)
HDL: 63 mg/dL (ref >39)
LDL Chol Calc (NIH): 79 mg/dL (ref 0–99)
Triglycerides: 204 mg/dL — ABNORMAL HIGH (ref 0–149)
VLDL Cholesterol Cal: 34 mg/dL (ref 5–40)

## 2021-05-13 LAB — HEMOGLOBIN A1C
Est. average glucose Bld gHb Est-mCnc: 108 mg/dL
Hgb A1c MFr Bld: 5.4 % (ref 4.8–5.6)

## 2021-05-15 ENCOUNTER — Other Ambulatory Visit: Payer: Self-pay | Admitting: Family Medicine

## 2021-05-16 ENCOUNTER — Encounter: Payer: Self-pay | Admitting: Family Medicine

## 2021-07-06 ENCOUNTER — Ambulatory Visit: Payer: Managed Care, Other (non HMO) | Admitting: Family Medicine

## 2021-07-06 ENCOUNTER — Encounter: Payer: Self-pay | Admitting: Family Medicine

## 2021-07-06 ENCOUNTER — Other Ambulatory Visit: Payer: Self-pay

## 2021-07-06 VITALS — BP 150/80 | HR 80 | Temp 98.8°F | Ht 68.0 in | Wt 192.8 lb

## 2021-07-06 DIAGNOSIS — I10 Essential (primary) hypertension: Secondary | ICD-10-CM

## 2021-07-06 DIAGNOSIS — Z23 Encounter for immunization: Secondary | ICD-10-CM | POA: Diagnosis not present

## 2021-07-06 DIAGNOSIS — M7521 Bicipital tendinitis, right shoulder: Secondary | ICD-10-CM | POA: Diagnosis not present

## 2021-07-06 DIAGNOSIS — J309 Allergic rhinitis, unspecified: Secondary | ICD-10-CM | POA: Diagnosis not present

## 2021-07-06 MED ORDER — AMLODIPINE BESYLATE 5 MG PO TABS: 5.0000 mg | ORAL_TABLET | Freq: Every day | ORAL | 3 refills | Status: DC

## 2021-07-06 MED ORDER — AZELASTINE HCL 0.1 % NA SOLN: 2.0000 | Freq: Two times a day (BID) | NASAL | 12 refills | Status: DC

## 2021-07-06 MED ORDER — HYDROCHLOROTHIAZIDE 12.5 MG PO CAPS: 12.5000 mg | ORAL_CAPSULE | Freq: Every day | ORAL | 1 refills | Status: DC

## 2021-07-06 MED ORDER — FLUTICASONE PROPIONATE 50 MCG/ACT NA SUSP: 2.0000 | Freq: Every day | NASAL | 6 refills | Status: AC

## 2021-07-06 NOTE — Progress Notes (Signed)
Collin Rumps, MD Phone: [phone]  Collin Mills is a 65 y.o. male who presents today for follow-up.  Hypertension: Typically 150s-170s/80s-90s.  He has been on amlodipine 10 mg once daily and losartan 100 mg daily.  No chest pain or shortness of breath.  He has noted some increased edema in his legs with the increased dose of amlodipine.  He notes no new supplements or any changes to his diet.  Allergies: Patient has chronic allergic rhinitis.  He uses Flonase most days.  He does note it helps though it does not resolve his symptoms.  He notes typically having sneezing and rhinorrhea.  No postnasal drip.  Right biceps tendinitis: Patient notes this has not changed at all.  He continues to have issues with this particularly when he flexes his bicep against weight does pull-ups.  Social History   Tobacco Use  Smoking Status Former   Packs/day: 2.00   Years: 36.00   Pack years: 72.00   Types: Cigarettes   Quit date: 2004   Years since quitting: 18.7  Smokeless Tobacco Never    Current Outpatient Medications on File Prior to Visit  Medication Sig Dispense Refill   atorvastatin (LIPITOR) 80 MG tablet TAKE 1 TABLET(80 MG) BY MOUTH DAILY 90 tablet 1   celecoxib (CELEBREX) 200 MG capsule Take 200 mg by mouth daily.     glucose blood test strip Check once daily, diagnosis code E11.9, dispense based on patient and insurance preferences 100 each 12   hydrocortisone (ANUSOL-HC) 25 MG suppository Place 1 suppository (25 mg total) rectally at bedtime. 30 suppository 3   losartan (COZAAR) 100 MG tablet Take 1 tablet (100 mg total) by mouth daily. 90 tablet 3   omeprazole (PRILOSEC) 40 MG capsule TAKE 1 CAPSULE(40 MG) BY MOUTH DAILY 90 capsule 3   No current facility-administered medications on file prior to visit.     ROS see history of present illness  Objective  Physical Exam Vitals:   07/06/21 1133  BP: (!) 150/80  Pulse: 80  Temp: 98.8 F (37.1 C)  SpO2: 97%     BP Readings from Last 3 Encounters:  07/06/21 (!) 150/80  05/12/21 (!) 142/68  05/04/21 (!) 168/95   Wt Readings from Last 3 Encounters:  07/06/21 192 lb 12.8 oz (87.5 kg)  05/12/21 186 lb 3.2 oz (84.5 kg)  03/24/21 191 lb 2 oz (86.7 kg)    Physical Exam Constitutional:      General: He is not in acute distress.    Appearance: He is not diaphoretic.  Cardiovascular:     Rate and Rhythm: Normal rate and regular rhythm.     Heart sounds: Normal heart sounds.  Pulmonary:     Effort: Pulmonary effort is normal.     Breath sounds: Normal breath sounds.  Musculoskeletal:     Comments: Patient has tenderness over the proximal biceps tendon when his arm is externally rotated  Skin:    General: Skin is warm and dry.  Neurological:     Mental Status: He is alert.     Assessment/Plan: Please see individual problem list.  Problem List Items Addressed This Visit     Allergic rhinitis    Chronic issue.  He will continue Flonase.  We will start him on Astelin as well.  If he has any nasal irritation with this combination he will let us know.      Relevant Medications   azelastine (ASTELIN) 0.1 % nasal spray   fluticasone (FLONASE) 50  MCG/ACT nasal spray   Biceps tendinitis of right shoulder    This continues to be an issue.  I discussed reducing his weight lifting with regards to exercises that will put stress on this area.  We will refer to orthopedics.      Relevant Orders   Ambulatory referral to Orthopedic Surgery   Essential hypertension    Remains uncontrolled.  The patient will reduce his amlodipine to 5 mg once daily.  We will add on HCTZ 12.5 mg daily.  He will continue losartan 100 mg daily.  He will follow-up with me in about 4 weeks.  He will have lab work done in about 4 weeks.  He will also try holding his Celebrex for a few days to see if his blood pressure trends down.      Relevant Medications   amLODipine (NORVASC) 5 MG tablet   hydrochlorothiazide  (MICROZIDE) 12.5 MG capsule   Other Relevant Orders   Basic Metabolic Panel (BMET)   Other Visit Diagnoses     Need for immunization against influenza    -  Primary   Relevant Orders   Flu Vaccine QUAD 64moIM (Fluarix, Fluzone & Alfiuria Quad PF) (Completed)       Return in about 4 weeks (around 08/03/2021) for Hypertension follow-up.  This visit occurred during the SARS-CoV-2 public health emergency.  Safety protocols were in place, including screening questions prior to the visit, additional usage of staff PPE, and extensive cleaning of exam room while observing appropriate contact time as indicated for disinfecting solutions.    ETommi Rumps MD [facility]

## 2021-07-06 NOTE — Assessment & Plan Note (Signed)
Chronic issue.  He will continue Flonase.  We will start him on Astelin as well.  If he has any nasal irritation with this combination he will let us know.

## 2021-07-06 NOTE — Patient Instructions (Signed)
Nice to see you. Please get lab work done in about 4 weeks.  I will see you back around that time as well. Please try the Astelin with your Flonase to see if that is beneficial for your rhinitis. Please decrease your amlodipine dose to 5 mg once daily.  You will continue on the losartan 100 mg once daily.  We will start on HCTZ 12.5 mg once daily as well. Please try stopping your Celebrex for a few days to see if your blood pressure trends down.

## 2021-07-06 NOTE — Assessment & Plan Note (Addendum)
Remains uncontrolled.  The patient will reduce his amlodipine to 5 mg once daily.  We will add on HCTZ 12.5 mg daily.  He will continue losartan 100 mg daily.  He will follow-up with me in about 4 weeks.  He will have lab work done in about 4 weeks.  He will also try holding his Celebrex for a few days to see if his blood pressure trends down.

## 2021-07-06 NOTE — Assessment & Plan Note (Signed)
This continues to be an issue.  I discussed reducing his weight lifting with regards to exercises that will put stress on this area.  We will refer to orthopedics.

## 2021-07-18 ENCOUNTER — Other Ambulatory Visit: Payer: Self-pay | Admitting: Gastroenterology

## 2021-07-18 DIAGNOSIS — D500 Iron deficiency anemia secondary to blood loss (chronic): Secondary | ICD-10-CM

## 2021-08-03 ENCOUNTER — Other Ambulatory Visit: Payer: Self-pay | Admitting: Family Medicine

## 2021-08-03 DIAGNOSIS — E785 Hyperlipidemia, unspecified: Secondary | ICD-10-CM

## 2021-08-09 ENCOUNTER — Other Ambulatory Visit: Payer: Self-pay | Admitting: Family Medicine

## 2021-08-13 LAB — BASIC METABOLIC PANEL
BUN/Creatinine Ratio: 15 (ref 10–24)
BUN: 17 mg/dL (ref 8–27)
CO2: 23 mmol/L (ref 20–29)
Calcium: 10.2 mg/dL (ref 8.6–10.2)
Chloride: 102 mmol/L (ref 96–106)
Creatinine, Ser: 1.15 mg/dL (ref 0.76–1.27)
Glucose: 108 mg/dL — ABNORMAL HIGH (ref 70–99)
Potassium: 4 mmol/L (ref 3.5–5.2)
Sodium: 144 mmol/L (ref 134–144)
eGFR: 71 mL/min/{1.73_m2} (ref >59)

## 2021-08-17 ENCOUNTER — Ambulatory Visit (INDEPENDENT_AMBULATORY_CARE_PROVIDER_SITE_OTHER): Payer: Managed Care, Other (non HMO) | Admitting: Family Medicine

## 2021-08-17 ENCOUNTER — Other Ambulatory Visit: Payer: Self-pay

## 2021-08-17 ENCOUNTER — Encounter: Payer: Self-pay | Admitting: Family Medicine

## 2021-08-17 ENCOUNTER — Telehealth: Payer: Self-pay | Admitting: Pharmacist

## 2021-08-17 DIAGNOSIS — I10 Essential (primary) hypertension: Secondary | ICD-10-CM | POA: Diagnosis not present

## 2021-08-17 DIAGNOSIS — R6882 Decreased libido: Secondary | ICD-10-CM | POA: Diagnosis not present

## 2021-08-17 NOTE — Progress Notes (Signed)
Collin Rumps, MD Phone: [phone]  Collin Mills is a 65 y.o. male who presents today for f/u.  HYPERTENSION Disease Monitoring Home BP Monitoring 120s-150s/70s-80s Chest pain- no    Dyspnea- no Medications Compliance-  taking amlodipine, losartan, HCTZ.   Edema- no BMET    Component Value Date/Time   NA 144 08/12/2021 0713   NA 142 12/05/2011 0820   K 4.0 08/12/2021 0713   K 4.0 12/05/2011 0820   CL 102 08/12/2021 0713   CL 107 12/05/2011 0820   CO2 23 08/12/2021 0713   CO2 28 12/05/2011 0820   GLUCOSE 108 (H) 08/12/2021 0713   GLUCOSE 114 (H) 01/13/2020 1639   GLUCOSE 81 12/05/2011 0820   BUN 17 08/12/2021 0713   BUN 10 12/05/2011 0820   CREATININE 1.15 08/12/2021 0713   CREATININE 0.92 12/05/2011 0820   CALCIUM 10.2 08/12/2021 0713   CALCIUM 9.5 12/05/2011 0820   GFRNONAA 79 07/30/2020 0721   GFRNONAA >60 12/05/2011 0820   GFRAA 91 07/30/2020 0721   GFRAA >60 12/05/2011 0820   Sexual difficulty: Patient reports since going back on the hydrochlorothiazide his sex drive has been decreased.  He notes no anxiety or depression.  He notes previously when we discontinued the hydrochlorothiazide and amlodipine and sex drive returned.  Social History   Tobacco Use  Smoking Status Former   Packs/day: 2.00   Years: 36.00   Pack years: 72.00   Types: Cigarettes   Quit date: 2004   Years since quitting: 18.8  Smokeless Tobacco Never    Current Outpatient Medications on File Prior to Visit  Medication Sig Dispense Refill   amLODipine (NORVASC) 5 MG tablet Take 1 tablet (5 mg total) by mouth daily. 90 tablet 3   atorvastatin (LIPITOR) 80 MG tablet TAKE 1 TABLET(80 MG) BY MOUTH DAILY 90 tablet 1   azelastine (ASTELIN) 0.1 % nasal spray Place 2 sprays into both nostrils 2 (two) times daily. Use in each nostril as directed 30 mL 12   celecoxib (CELEBREX) 200 MG capsule Take 200 mg by mouth daily.     fluticasone (FLONASE) 50 MCG/ACT nasal spray Place 2 sprays  into both nostrils daily. 16 g 6   glucose blood test strip Check once daily, diagnosis code E11.9, dispense based on patient and insurance preferences 100 each 12   hydrochlorothiazide (MICROZIDE) 12.5 MG capsule Take 1 capsule (12.5 mg total) by mouth daily. 90 capsule 1   hydrocortisone (ANUSOL-HC) 25 MG suppository Place 1 suppository (25 mg total) rectally at bedtime. 30 suppository 3   losartan (COZAAR) 100 MG tablet Take 1 tablet (100 mg total) by mouth daily. 90 tablet 3   omeprazole (PRILOSEC) 40 MG capsule TAKE 1 CAPSULE(40 MG) BY MOUTH DAILY 90 capsule 3   hydrocortisone 2.5 % lotion Apply topically daily as needed.     ketoconazole (NIZORAL) 2 % cream Apply topically.     No current facility-administered medications on file prior to visit.     ROS see history of present illness  Objective  Physical Exam Vitals:   08/17/21 1138  BP: 136/78  Pulse: 76  Temp: (!) 96.1 F (35.6 C)  SpO2: 98%    BP Readings from Last 3 Encounters:  08/17/21 136/78  07/06/21 (!) 150/80  05/12/21 (!) 142/68   Wt Readings from Last 3 Encounters:  08/17/21 192 lb 6.4 oz (87.3 kg)  07/06/21 192 lb 12.8 oz (87.5 kg)  05/12/21 186 lb 3.2 oz (84.5 kg)    Physical  Exam Constitutional:      General: He is not in acute distress.    Appearance: He is not diaphoretic.  Cardiovascular:     Rate and Rhythm: Normal rate and regular rhythm.     Heart sounds: Normal heart sounds.  Pulmonary:     Effort: Pulmonary effort is normal.     Breath sounds: Normal breath sounds.  Skin:    General: Skin is warm and dry.  Neurological:     Mental Status: He is alert.     Assessment/Plan: Please see individual problem list.  Problem List Items Addressed This Visit     Decreased libido    We will work on altering his blood pressure regimen to see if that will make a difference.      Essential hypertension    Still elevated.  He has had some issues with decreased sex drive since going back on  blood pressure medication.  Discussed that I would discuss this with our clinical pharmacist to identify the least likely blood pressure medication to cause this and we will work on switching him from the HCTZ to something different.  He will continue amlodipine 5 mg once daily and losartan 100 mg daily.       Return in about 3 months (around 11/17/2021) for HTN.  This visit occurred during the SARS-CoV-2 public health emergency.  Safety protocols were in place, including screening questions prior to the visit, additional usage of staff PPE, and extensive cleaning of exam room while observing appropriate contact time as indicated for disinfecting solutions.    Collin Rumps, MD [place]

## 2021-08-17 NOTE — Assessment & Plan Note (Signed)
Still elevated.  He has had some issues with decreased sex drive since going back on blood pressure medication.  Discussed that I would discuss this with our clinical pharmacist to identify the least likely blood pressure medication to cause this and we will work on switching him from the HCTZ to something different.  He will continue amlodipine 5 mg once daily and losartan 100 mg daily.

## 2021-08-17 NOTE — Patient Instructions (Signed)
Nice to see you. I am going to discuss your BP meds with our pharmacist and see what we can come up with to help with the sex drive issue.

## 2021-08-17 NOTE — Telephone Encounter (Signed)
Error

## 2021-08-17 NOTE — Assessment & Plan Note (Signed)
We will work on altering his blood pressure regimen to see if that will make a difference.

## 2021-08-21 ENCOUNTER — Telehealth: Payer: Self-pay | Admitting: Family Medicine

## 2021-08-21 DIAGNOSIS — I10 Essential (primary) hypertension: Secondary | ICD-10-CM

## 2021-08-21 NOTE — Telephone Encounter (Signed)
-----   Message from De Hollingshead, [place] sent at 08/17/2021 12:21 PM EDT ----- Marykay Lex,   It does look like amlodipine has a <2% incidence of male sexual disorder.   Maybe try d/c amlodipine, continue HCTZ, switch losartan to telmisartan or olmesartan? I prefer any ARB option over losartan, due to shorter half life it would really be most effective for it to be a BID drug. Telmisartan and olmesartan have longer half life, so more effective usually.   Could switch HCTZ to chlorthalidone moving forward if further BP lowering needed.   Catie  ----- Message ----- From: Leone Haven, MD Sent: 08/17/2021  12:04 PM EDT To: De Hollingshead, RPH-CPP  Hey,   I saw this patient today for follow-up on his BP. We have recently restarted him on HCTZ and amlodipine. He has been on losartan for some time now. He noted decreased sex drive since going back on the amlodipine and HCTZ. He was previously on those and his sex drive was low though improved with stopping these medications as his BP improved with weight loss. I do not typically see decreased sex drive as a side effect of these BP meds and wanted to see if you had any thoughts on other BP med options that would be less likely to cause this issue. I would typically go with coreg next though I know beta blockers carry the risk of this side effect. Would switching the losartan to another ARB be a better option to see if it would work better? Any other options? Let me know if you have questions. Thanks.  Randall Hiss

## 2021-08-21 NOTE — Telephone Encounter (Signed)
Please let the patient know that I heard back from our clinical pharmacist.  It looks like there is the potential for male sexual disorder with the amlodipine.  We would like to have the patient's stop the amlodipine.  He will continue the HCTZ.  I would like to switch him from the losartan to telmisartan.  Once you speak with him I can send that in.  He would need labs about 10 days after making that switch.  He could have a blood pressure check with nursing at that time as well.

## 2021-08-22 NOTE — Telephone Encounter (Signed)
I called and LVM for the patient to call back. Samay Delcarlo,cma

## 2021-08-23 NOTE — Telephone Encounter (Signed)
I called and spoke with the patient and informed him that the provider wants him to stop taking the amlodipine but continue the HCTZ..  The patient is okay with the provider switching from Losartan to telmisartan and you can send that one to the pharmacy.  Patient stated his labs need to be done at [facility] in 10 days but he did schedule a BP check in 10 days as a nurse visit. Londynn Sonoda,cma

## 2021-08-24 MED ORDER — TELMISARTAN 80 MG PO TABS: 80.0000 mg | ORAL_TABLET | Freq: Every day | ORAL | 1 refills | Status: DC

## 2021-08-24 NOTE — Telephone Encounter (Signed)
Telmisartan sent to pharmacy.  Labs ordered to be completed at [facility].

## 2021-08-24 NOTE — Addendum Note (Signed)
Addended by: Leone Haven on: 08/24/2021 10:38 AM   Modules accepted: Orders

## 2021-09-02 ENCOUNTER — Other Ambulatory Visit: Payer: Self-pay

## 2021-09-02 ENCOUNTER — Ambulatory Visit: Payer: Managed Care, Other (non HMO)

## 2021-09-02 VITALS — BP 156/92 | HR 80

## 2021-09-02 DIAGNOSIS — I10 Essential (primary) hypertension: Secondary | ICD-10-CM

## 2021-09-02 NOTE — Progress Notes (Signed)
Patient is here for a BP check due to bp being high at last visit, as per patient.  Currently patients BP is 156/92 and BPM is 80. Same BP and BPM ten minutes later. Patient has no complaints of headaches, blurry vision, chest pain, arm pain, light headedness, dizziness, and nor jaw pain. Please see previous note for order.

## 2021-09-05 ENCOUNTER — Telehealth: Payer: Self-pay

## 2021-09-05 NOTE — Telephone Encounter (Signed)
-----   Message from Leone Haven, MD sent at 09/04/2021 11:23 AM EST ----- Please see my attestation.

## 2021-09-05 NOTE — Telephone Encounter (Signed)
Left message for patient to return call back.  

## 2021-09-05 NOTE — Telephone Encounter (Signed)
Patient has been informed.

## 2021-09-07 LAB — BASIC METABOLIC PANEL
BUN/Creatinine Ratio: 18 (ref 10–24)
BUN: 21 mg/dL (ref 8–27)
CO2: 21 mmol/L (ref 20–29)
Calcium: 10 mg/dL (ref 8.6–10.2)
Chloride: 104 mmol/L (ref 96–106)
Creatinine, Ser: 1.19 mg/dL (ref 0.76–1.27)
Glucose: 107 mg/dL — ABNORMAL HIGH (ref 70–99)
Potassium: 4.1 mmol/L (ref 3.5–5.2)
Sodium: 140 mmol/L (ref 134–144)
eGFR: 68 mL/min/{1.73_m2} (ref >59)

## 2021-09-07 MED ORDER — HYDROCHLOROTHIAZIDE 25 MG PO TABS: 25.0000 mg | ORAL_TABLET | Freq: Every day | ORAL | 1 refills | Status: DC

## 2021-09-07 NOTE — Addendum Note (Signed)
Addended by: Fulton Mole D on: 09/07/2021 01:30 PM   Modules accepted: Orders

## 2021-10-10 ENCOUNTER — Ambulatory Visit (INDEPENDENT_AMBULATORY_CARE_PROVIDER_SITE_OTHER): Payer: Managed Care, Other (non HMO)

## 2021-10-10 ENCOUNTER — Other Ambulatory Visit: Payer: Self-pay

## 2021-10-10 VITALS — BP 124/64 | HR 64

## 2021-10-10 DIAGNOSIS — I10 Essential (primary) hypertension: Secondary | ICD-10-CM | POA: Diagnosis not present

## 2021-10-10 NOTE — Progress Notes (Signed)
Patient is here for a BP check due to bp being high at last visit, as per patient.  Currently patients BP is 124/64 and BPM is 64.  Patient has no complaints of headaches, blurry vision, chest pain, arm pain, light headedness, dizziness, and nor jaw pain. Please see previous note for order.

## 2021-10-12 NOTE — Progress Notes (Signed)
Patient has been informed.

## 2021-10-19 ENCOUNTER — Other Ambulatory Visit: Payer: Self-pay | Admitting: Family Medicine

## 2021-10-19 DIAGNOSIS — E119 Type 2 diabetes mellitus without complications: Secondary | ICD-10-CM

## 2021-10-19 NOTE — Telephone Encounter (Signed)
Notes say DC metformin 7/22 and last A1c 5.4 pleas advise to refill.

## 2021-10-19 NOTE — Telephone Encounter (Signed)
He should no longer be on this.

## 2021-11-24 ENCOUNTER — Encounter: Payer: Self-pay | Admitting: Family Medicine

## 2021-11-24 ENCOUNTER — Encounter: Payer: Self-pay | Admitting: Gastroenterology

## 2021-11-24 DIAGNOSIS — I10 Essential (primary) hypertension: Secondary | ICD-10-CM

## 2021-11-25 ENCOUNTER — Ambulatory Visit: Payer: Managed Care, Other (non HMO) | Admitting: Family Medicine

## 2021-11-25 ENCOUNTER — Other Ambulatory Visit: Payer: Self-pay

## 2021-11-25 ENCOUNTER — Encounter: Payer: Self-pay | Admitting: Family Medicine

## 2021-11-25 DIAGNOSIS — I10 Essential (primary) hypertension: Secondary | ICD-10-CM | POA: Diagnosis not present

## 2021-11-25 NOTE — Progress Notes (Signed)
Collin Rumps, MD Phone: [phone]  Collin Mills is a 66 y.o. male who presents today for f/u.  HYPERTENSION Disease Monitoring Home BP Monitoring 108-136/71-80s Chest pain- no    Dyspnea- no Medications Compliance-  taking HCTZ, telmisartan.  Edema- no BMET    Component Value Date/Time   NA 140 09/06/2021 0714   NA 142 12/05/2011 0820   K 4.1 09/06/2021 0714   K 4.0 12/05/2011 0820   CL 104 09/06/2021 0714   CL 107 12/05/2011 0820   CO2 21 09/06/2021 0714   CO2 28 12/05/2011 0820   GLUCOSE 107 (H) 09/06/2021 0714   GLUCOSE 114 (H) 01/13/2020 1639   GLUCOSE 81 12/05/2011 0820   BUN 21 09/06/2021 0714   BUN 10 12/05/2011 0820   CREATININE 1.19 09/06/2021 0714   CREATININE 0.92 12/05/2011 0820   CALCIUM 10.0 09/06/2021 0714   CALCIUM 9.5 12/05/2011 0820   GFRNONAA 79 07/30/2020 0721   GFRNONAA >60 12/05/2011 0820   GFRAA 91 07/30/2020 0721   GFRAA >60 12/05/2011 0820     Social History   Tobacco Use  Smoking Status Former   Packs/day: 2.00   Years: 36.00   Pack years: 72.00   Types: Cigarettes   Quit date: 2004   Years since quitting: 19.1  Smokeless Tobacco Never    Current Outpatient Medications on File Prior to Visit  Medication Sig Dispense Refill   atorvastatin (LIPITOR) 80 MG tablet TAKE 1 TABLET(80 MG) BY MOUTH DAILY 90 tablet 1   azelastine (ASTELIN) 0.1 % nasal spray Place 2 sprays into both nostrils 2 (two) times daily. Use in each nostril as directed 30 mL 12   celecoxib (CELEBREX) 200 MG capsule Take 200 mg by mouth daily.     fluticasone (FLONASE) 50 MCG/ACT nasal spray Place 2 sprays into both nostrils daily. 16 g 6   glucose blood test strip Check once daily, diagnosis code E11.9, dispense based on patient and insurance preferences 100 each 12   hydrochlorothiazide (HYDRODIURIL) 25 MG tablet Take 1 tablet (25 mg total) by mouth daily. 90 tablet 1   hydrocortisone (ANUSOL-HC) 25 MG suppository Place 1 suppository (25 mg total)  rectally at bedtime. 30 suppository 3   hydrocortisone 2.5 % lotion Apply topically daily as needed.     ketoconazole (NIZORAL) 2 % cream Apply topically.     omeprazole (PRILOSEC) 40 MG capsule TAKE 1 CAPSULE(40 MG) BY MOUTH DAILY 90 capsule 3   telmisartan (MICARDIS) 80 MG tablet Take 1 tablet (80 mg total) by mouth daily. 90 tablet 1   No current facility-administered medications on file prior to visit.     ROS see history of present illness  Objective  Physical Exam Vitals:   11/25/21 1546  BP: 110/80  Pulse: 99  Temp: 98.2 F (36.8 C)  SpO2: 97%    BP Readings from Last 3 Encounters:  11/25/21 110/80  10/10/21 124/64  09/02/21 (!) 156/92   Wt Readings from Last 3 Encounters:  11/25/21 195 lb 3.2 oz (88.5 kg)  08/17/21 192 lb 6.4 oz (87.3 kg)  07/06/21 192 lb 12.8 oz (87.5 kg)    Physical Exam Constitutional:      General: He is not in acute distress.    Appearance: He is not diaphoretic.  Cardiovascular:     Rate and Rhythm: Normal rate and regular rhythm.     Heart sounds: Normal heart sounds.  Pulmonary:     Effort: Pulmonary effort is normal.  Breath sounds: Normal breath sounds.  Skin:    General: Skin is warm and dry.  Neurological:     Mental Status: He is alert.     Assessment/Plan: Please see individual problem list.  Problem List Items Addressed This Visit     Essential hypertension    Adequately controlled at this time.  He will continue HCTZ 25 mg once daily and telmisartan 80 mg daily.  He will have his BMP done next week at [facility].  He will follow-up with me in 6 months.       Return in about 6 months (around 05/25/2022) for Hypertension.  This visit occurred during the SARS-CoV-2 public health emergency.  Safety protocols were in place, including screening questions prior to the visit, additional usage of staff PPE, and extensive cleaning of exam room while observing appropriate contact time as indicated for disinfecting solutions.     Collin Rumps, MD [place]

## 2021-11-25 NOTE — Patient Instructions (Signed)
Nice to see you. Your blood pressure has improved.  Please continue with hydrochlorothiazide and telmisartan.  Please have your lab work next week as we discussed.

## 2021-11-25 NOTE — Assessment & Plan Note (Signed)
Adequately controlled at this time.  He will continue HCTZ 25 mg once daily and telmisartan 80 mg daily.  He will have his BMP done next week at [facility].  He will follow-up with me in 6 months.

## 2021-11-30 LAB — BASIC METABOLIC PANEL
BUN/Creatinine Ratio: 17 (ref 10–24)
BUN: 17 mg/dL (ref 8–27)
CO2: 21 mmol/L (ref 20–29)
Calcium: 10.4 mg/dL — ABNORMAL HIGH (ref 8.6–10.2)
Chloride: 102 mmol/L (ref 96–106)
Creatinine, Ser: 1.03 mg/dL (ref 0.76–1.27)
Glucose: 120 mg/dL — ABNORMAL HIGH (ref 70–99)
Potassium: 3.9 mmol/L (ref 3.5–5.2)
Sodium: 140 mmol/L (ref 134–144)
eGFR: 81 mL/min/{1.73_m2} (ref >59)

## 2021-12-02 ENCOUNTER — Other Ambulatory Visit: Payer: Self-pay | Admitting: Family Medicine

## 2021-12-09 HISTORY — PX: SHOULDER SURGERY: SHX246

## 2021-12-16 LAB — PTH, INTACT AND CALCIUM
Calcium: 10.1 mg/dL (ref 8.6–10.2)
PTH: 27 pg/mL (ref 15–65)

## 2021-12-28 ENCOUNTER — Other Ambulatory Visit: Payer: Self-pay

## 2021-12-28 ENCOUNTER — Ambulatory Visit (AMBULATORY_SURGERY_CENTER): Payer: Managed Care, Other (non HMO) | Admitting: *Deleted

## 2021-12-28 VITALS — Ht 68.0 in | Wt 188.0 lb

## 2021-12-28 DIAGNOSIS — Z86010 Personal history of colonic polyps: Secondary | ICD-10-CM

## 2021-12-28 DIAGNOSIS — K2270 Barrett's esophagus without dysplasia: Secondary | ICD-10-CM

## 2021-12-28 MED ORDER — PLENVU 140 G PO SOLR: 1.0000 | ORAL | 0 refills | Status: DC

## 2021-12-28 NOTE — Progress Notes (Signed)
No egg or soy allergy known to patient  ?No issues known to pt with past sedation with any surgeries or procedures ?Patient denies ever being told they had issues or difficulty with intubation  ?No FH of Malignant Hyperthermia ?Pt is not on diet pills ?Pt is not on  home 02  ?Pt is not on blood thinners  ?Pt denies issues with constipation  ?No A fib or A flutter ? ?Pt is fully vaccinated  for Covid  ?Pt has a plenvu kit at home  - in date  ? ?Due to the COVID-19 pandemic we are asking patients to follow certain guidelines in PV and the [place]   ?Pt aware of COVID protocols and [facility] guidelines  ? ?PV completed over the phone. Pt verified name, DOB, address and insurance during PV today.  ?Pt mailed instruction packet with copy of consent form to read and not return, and instructions.  ?Pt encouraged to call with questions or issues.  ?If pt has My chart, procedure instructions sent via My Chart  ? ?

## 2022-01-02 LAB — HM DIABETES EYE EXAM

## 2022-01-05 ENCOUNTER — Encounter: Payer: Self-pay | Admitting: Gastroenterology

## 2022-01-11 ENCOUNTER — Ambulatory Visit (AMBULATORY_SURGERY_CENTER): Payer: Managed Care, Other (non HMO) | Admitting: Gastroenterology

## 2022-01-11 ENCOUNTER — Encounter: Payer: Self-pay | Admitting: Gastroenterology

## 2022-01-11 ENCOUNTER — Other Ambulatory Visit: Payer: Self-pay

## 2022-01-11 VITALS — BP 117/71 | HR 89 | Temp 97.3°F | Resp 14 | Ht 68.0 in | Wt 188.0 lb

## 2022-01-11 DIAGNOSIS — D124 Benign neoplasm of descending colon: Secondary | ICD-10-CM | POA: Diagnosis not present

## 2022-01-11 DIAGNOSIS — K2270 Barrett's esophagus without dysplasia: Secondary | ICD-10-CM | POA: Diagnosis not present

## 2022-01-11 DIAGNOSIS — K2970 Gastritis, unspecified, without bleeding: Secondary | ICD-10-CM

## 2022-01-11 DIAGNOSIS — D122 Benign neoplasm of ascending colon: Secondary | ICD-10-CM | POA: Diagnosis not present

## 2022-01-11 DIAGNOSIS — D123 Benign neoplasm of transverse colon: Secondary | ICD-10-CM

## 2022-01-11 DIAGNOSIS — K31A0 Gastric intestinal metaplasia, unspecified: Secondary | ICD-10-CM | POA: Diagnosis not present

## 2022-01-11 DIAGNOSIS — Z86010 Personal history of colonic polyps: Secondary | ICD-10-CM | POA: Diagnosis not present

## 2022-01-11 MED ORDER — SODIUM CHLORIDE 0.9 % IV SOLN: 500.0000 mL | Freq: Once | INTRAVENOUS | Status: DC

## 2022-01-11 NOTE — Progress Notes (Signed)
Called to room to assist during endoscopic procedure.  Patient ID and intended procedure confirmed with present staff. Received instructions for my participation in the procedure from the performing physician.  

## 2022-01-11 NOTE — Progress Notes (Signed)
0858 Robinul 0.1 mg IV given due large amount of secretions upon assessment.  MD made aware, vss  ?

## 2022-01-11 NOTE — Progress Notes (Signed)
Report given to PACU, vss 

## 2022-01-11 NOTE — Patient Instructions (Signed)
Please read handouts provided. ?Continue present medications. ?Await pathology results. ?Return to GI office in 6 months. ?Follow an antireflux regimen. ? ? ? ?YOU HAD AN ENDOSCOPIC PROCEDURE TODAY AT [facility]:   Refer to the procedure report that was given to you for any specific questions about what was found during the examination.  If the procedure report does not answer your questions, please call your gastroenterologist to clarify.  If you requested that your care partner not be given the details of your procedure findings, then the procedure report has been included in a sealed envelope for you to review at your convenience later. ? ?YOU SHOULD EXPECT: Some feelings of bloating in the abdomen. Passage of more gas than usual.  Walking can help get rid of the air that was put into your GI tract during the procedure and reduce the bloating. If you had a lower endoscopy (such as a colonoscopy or flexible sigmoidoscopy) you may notice spotting of blood in your stool or on the toilet paper. If you underwent a bowel prep for your procedure, you may not have a normal bowel movement for a few days. ? ?Please Note:  You might notice some irritation and congestion in your nose or some drainage.  This is from the oxygen used during your procedure.  There is no need for concern and it should clear up in a day or so. ? ?SYMPTOMS TO REPORT IMMEDIATELY: ? ?Following lower endoscopy (colonoscopy or flexible sigmoidoscopy): ? Excessive amounts of blood in the stool ? Significant tenderness or worsening of abdominal pains ? Swelling of the abdomen that is new, acute ? Fever of 100?F or higher ? ?Following upper endoscopy (EGD) ? Vomiting of blood or coffee ground material ? New chest pain or pain under the shoulder blades ? Painful or persistently difficult swallowing ? New shortness of breath ? Fever of 100?F or higher ? Black, tarry-looking stools ? ?For urgent or emergent issues, a gastroenterologist can  be reached at any hour by calling [phone]. ?Do not use MyChart messaging for urgent concerns.  ? ? ?DIET:  We do recommend a small meal at first, but then you may proceed to your regular diet.  Drink plenty of fluids but you should avoid alcoholic beverages for 24 hours. ? ?ACTIVITY:  You should plan to take it easy for the rest of today and you should NOT DRIVE or use heavy machinery until tomorrow (because of the sedation medicines used during the test).   ? ?FOLLOW UP: ?Our staff will call the number listed on your records 48-72 hours following your procedure to check on you and address any questions or concerns that you may have regarding the information given to you following your procedure. If we do not reach you, we will leave a message.  We will attempt to reach you two times.  During this call, we will ask if you have developed any symptoms of COVID 19. If you develop any symptoms (ie: fever, flu-like symptoms, shortness of breath, cough etc.) before then, please call [phone].  If you test positive for Covid 19 in the 2 weeks post procedure, please call and report this information to [place].   ? ?If any biopsies were taken you will be contacted by phone or by letter within the next 1-3 weeks.  Please call us at [phone] if you have not heard about the biopsies in 3 weeks.  ? ? ?SIGNATURES/CONFIDENTIALITY: ?You and/or your care partner have signed paperwork  which will be entered into your electronic medical record.  These signatures attest to the fact that that the information above on your After Visit Summary has been reviewed and is understood.  Full responsibility of the confidentiality of this discharge information lies with you and/or your care-partner.  ?

## 2022-01-11 NOTE — Op Note (Signed)
[place] ?Patient Name: Jathen Sudano ?Procedure Date: 01/11/2022 8:58 AM ?MRN: [id number] ?Endoscopist: Mauri Pole , MD ?Age: 66 ?Referring MD:  ?Date of Birth: 10-29-1955 ?Gender: Male ?Account #: [id number] ?Procedure:                Upper GI endoscopy ?Indications:              Surveillance for malignancy due to personal history  ?                          of Barrett's esophagus ?Medicines:                Monitored Anesthesia Care ?Procedure:                Pre-Anesthesia Assessment: ?                          - Prior to the procedure, a History and Physical  ?                          was performed, and patient medications and  ?                          allergies were reviewed. The patient's tolerance of  ?                          previous anesthesia was also reviewed. The risks  ?                          and benefits of the procedure and the sedation  ?                          options and risks were discussed with the patient.  ?                          All questions were answered, and informed consent  ?                          was obtained. Prior Anticoagulants: The patient has  ?                          taken no previous anticoagulant or antiplatelet  ?                          agents. ASA Grade Assessment: II - A patient with  ?                          mild systemic disease. After reviewing the risks  ?                          and benefits, the patient was deemed in  ?                          satisfactory condition to undergo the procedure. ?  After obtaining informed consent, the endoscope was  ?                          passed under direct vision. Throughout the  ?                          procedure, the patient's blood pressure, pulse, and  ?                          oxygen saturations were monitored continuously. The  ?                          GIF HQ190 #[id number] was introduced through the  ?                          mouth, and advanced to the  second part of duodenum.  ?                          The upper GI endoscopy was accomplished without  ?                          difficulty. The patient tolerated the procedure  ?                          well. ?Scope In: ?Scope Out: ?Findings:                 There were esophageal mucosal changes secondary to  ?                          established long-segment Barrett's disease,  ?                          classified as Barrett's stage C3-M5 per [place]  ?                          criteria present in the lower third of the  ?                          esophagus. The maximum longitudinal extent of these  ?                          mucosal changes was 5 cm in length. Mucosa was  ?                          biopsied with a cold forceps for histology randomly  ?                          at intervals of 1 cm from 35 to 40 cm from the  ?                          incisors. One specimen bottle was sent to pathology. ?  Patchy mild inflammation characterized by  ?                          congestion (edema), erythema and granularity was  ?                          found in the gastric antrum and in the prepyloric  ?                          region of the stomach. Biopsies were taken with a  ?                          cold forceps for histology. Biopsies were taken  ?                          with a cold forceps for Helicobacter pylori testing. ?                          The examined duodenum was normal. ?                          The cardia and gastric fundus were normal on  ?                          retroflexion. ?Complications:            No immediate complications. ?Estimated Blood Loss:     Estimated blood loss was minimal. ?Impression:               - Esophageal mucosal changes secondary to  ?                          established long-segment Barrett's disease,  ?                          classified as Barrett's stage C3-M5 per [place]  ?                          criteria. Biopsied. ?                           - Gastritis. Biopsied. ?                          - Normal examined duodenum. ?Recommendation:           - Patient has a contact number available for  ?                          emergencies. The signs and symptoms of potential  ?                          delayed complications were discussed with the  ?                          patient. Return to normal activities tomorrow.  ?  Written discharge instructions were provided to the  ?                          patient. ?                          - Resume previous diet. ?                          - Continue present medications. ?                          - Await pathology results. ?                          - Return to GI office in 6 months. ?                          - Follow an antireflux regimen. ?Mauri Pole, MD ?01/11/2022 9:46:18 AM ?This report has been signed electronically. ?

## 2022-01-11 NOTE — Op Note (Signed)
Collin Mills ?Patient Name: Collin Mills ?Procedure Date: 01/11/2022 8:57 AM ?MRN: [id number] ?Endoscopist: Mauri Pole , MD ?Age: 66 ?Referring MD:  ?Date of Birth: 03-Jul-1956 ?Gender: Male ?Account #: [id number] ?Procedure:                Colonoscopy ?Indications:              High risk colon cancer surveillance: Personal  ?                          history of colonic polyps, High risk colon cancer  ?                          surveillance: Personal history of adenoma (10 mm or  ?                          greater in size), High risk colon cancer  ?                          surveillance: Personal history of multiple (3 or  ?                          more) adenomas ?Medicines:                Monitored Anesthesia Care ?Procedure:                Pre-Anesthesia Assessment: ?                          - Prior to the procedure, a History and Physical  ?                          was performed, and patient medications and  ?                          allergies were reviewed. The patient's tolerance of  ?                          previous anesthesia was also reviewed. The risks  ?                          and benefits of the procedure and the sedation  ?                          options and risks were discussed with the patient.  ?                          All questions were answered, and informed consent  ?                          was obtained. Prior Anticoagulants: The patient has  ?                          taken no previous anticoagulant or antiplatelet  ?  agents. ASA Grade Assessment: II - A patient with  ?                          mild systemic disease. After reviewing the risks  ?                          and benefits, the patient was deemed in  ?                          satisfactory condition to undergo the procedure. ?                          After obtaining informed consent, the colonoscope  ?                          was passed under direct vision. Throughout the  ?                           procedure, the patient's blood pressure, pulse, and  ?                          oxygen saturations were monitored continuously. The  ?                          [place] #[id number] was introduced through  ?                          the anus and advanced to the the cecum, identified  ?                          by appendiceal orifice and ileocecal valve. The  ?                          colonoscopy was performed without difficulty. The  ?                          patient tolerated the procedure well. The quality  ?                          of the bowel preparation was good. The ileocecal  ?                          valve, appendiceal orifice, and rectum were  ?                          photographed. ?Scope In: 9:19:40 AM ?Scope Out: 9:35:12 AM ?Scope Withdrawal Time: 0 hours 10 minutes 56 seconds  ?Total Procedure Duration: 0 hours 15 minutes 32 seconds  ?Findings:                 The perianal and digital rectal examinations were  ?                          normal. ?  Four sessile polyps were found in the transverse  ?                          colon and ascending colon. The polyps were 4 to 8  ?                          mm in size. These polyps were removed with a cold  ?                          snare. Resection and retrieval were complete. ?                          A 2 mm polyp was found in the descending colon. The  ?                          polyp was sessile. The polyp was removed with a  ?                          cold biopsy forceps. Resection and retrieval were  ?                          complete. ?                          Non-bleeding external and internal hemorrhoids were  ?                          found during retroflexion. The hemorrhoids were  ?                          large. ?                          The exam was otherwise without abnormality. ?Complications:            No immediate complications. ?Estimated Blood Loss:     Estimated blood loss  was minimal. ?Impression:               - Four 4 to 8 mm polyps in the transverse colon and  ?                          in the ascending colon, removed with a cold snare.  ?                          Resected and retrieved. ?                          - One 2 mm polyp in the descending colon, removed  ?                          with a cold biopsy forceps. Resected and retrieved. ?                          - Non-bleeding external and internal hemorrhoids. ?                          -  The examination was otherwise normal. ?Recommendation:           - Patient has a contact number available for  ?                          emergencies. The signs and symptoms of potential  ?                          delayed complications were discussed with the  ?                          patient. Return to normal activities tomorrow.  ?                          Written discharge instructions were provided to the  ?                          patient. ?                          - Resume previous diet. ?                          - Continue present medications. ?                          - Await pathology results. ?                          - Repeat colonoscopy in 3 - 5 years for  ?                          surveillance based on pathology results. ?Mauri Pole, MD ?01/11/2022 9:43:15 AM ?This report has been signed electronically. ?

## 2022-01-11 NOTE — Progress Notes (Signed)
VS-DT ? ?Pt's states no medical or surgical changes since previsit or office visit. ? ?

## 2022-01-11 NOTE — Progress Notes (Signed)
0926 Ephedrine 10 mg given IV due to low BP, MD updated.   ?

## 2022-01-11 NOTE — Progress Notes (Signed)
[facility] History and Physical ? ? ?Primary Care Physician:  Leone Haven, MD ? ? ?Reason for Procedure:  Barrett's esophagus and history of adenomatous colon polyps ? ?Plan:    Surveillance EGD and colonoscopy with possible interventions as needed ? ? ? ? ?HPI: Collin Mills is a very pleasant 66 y.o. male here for surveillance EGD and colonoscopy for Barrett's esophagus and history of colon polyps. ?Denies any nausea, vomiting, abdominal pain, melena or bright red blood per rectum ? ?The risks and benefits as well as alternatives of endoscopic procedure(s) have been discussed and reviewed. All questions answered. The patient agrees to proceed. ? ? ? ?Past Medical History:  ?Diagnosis Date  ? Allergic rhinitis   ? Allergy   ? Anemia   ? giving blood every six weeks  ? Barrett's esophagus   ? Cataract   ? forming  ? Chickenpox   ? Colon polyps   ? GERD (gastroesophageal reflux disease)   ? Hyperlipidemia   ? Hypertension   ? Iron deficiency anemia 11/11/2018  ? Migraines   ? past hx in the 90's  ? Osteoarthritis   ? osteoarthritis  ? ? ?Past Surgical History:  ?Procedure Laterality Date  ? COLONOSCOPY    ? ESOPHAGOGASTRODUODENOSCOPY ENDOSCOPY    ? POLYPECTOMY    ? SHOULDER SURGERY Right 12/09/2021  ? right shoulder and bicep surgery  ? UPPER GASTROINTESTINAL ENDOSCOPY    ? ? ?Prior to Admission medications   ?Medication Sig Start Date End Date Taking? Authorizing Provider  ?atorvastatin (LIPITOR) 80 MG tablet TAKE 1 TABLET(80 MG) BY MOUTH DAILY 08/03/21  Yes Leone Haven, MD  ?azelastine (ASTELIN) 0.1 % nasal spray Place 2 sprays into both nostrils 2 (two) times daily. Use in each nostril as directed 07/06/21  Yes Leone Haven, MD  ?celecoxib (CELEBREX) 200 MG capsule Take 200 mg by mouth daily. 02/07/21  Yes [provider]  ?fluticasone (FLONASE) 50 MCG/ACT nasal spray Place 2 sprays into both nostrils daily. 07/06/21  Yes Leone Haven, MD  ?glucose blood test  strip Check once daily, diagnosis code E11.9, dispense based on patient and insurance preferences 01/31/21  Yes Leone Haven, MD  ?hydrochlorothiazide (HYDRODIURIL) 25 MG tablet Take 1 tablet (25 mg total) by mouth daily. 09/07/21  Yes Leone Haven, MD  ?hydrocortisone 2.5 % lotion Apply topically daily as needed. 08/09/21  Yes [provider]  ?omeprazole (PRILOSEC) 40 MG capsule TAKE 1 CAPSULE(40 MG) BY MOUTH DAILY 08/11/21  Yes Leone Haven, MD  ?telmisartan (MICARDIS) 80 MG tablet Take 1 tablet (80 mg total) by mouth daily. 08/24/21  Yes Leone Haven, MD  ?HYDROcodone-acetaminophen (NORCO/VICODIN) 5-325 MG tablet  01/02/22   [provider]  ?hydrocortisone (ANUSOL-HC) 25 MG suppository Place 1 suppository (25 mg total) rectally at bedtime. 01/13/20   Mauri Pole, MD  ?ketoconazole (NIZORAL) 2 % cream Apply topically. 08/09/21   [provider]  ?ondansetron (ZOFRAN-ODT) 4 MG disintegrating tablet Take 4 mg by mouth every 8 (eight) hours as needed. ?Patient not taking: Reported on 12/28/2021 11/28/21   [provider]  ? ? ?Current Outpatient Medications  ?Medication Sig Dispense Refill  ? atorvastatin (LIPITOR) 80 MG tablet TAKE 1 TABLET(80 MG) BY MOUTH DAILY 90 tablet 1  ? azelastine (ASTELIN) 0.1 % nasal spray Place 2 sprays into both nostrils 2 (two) times daily. Use in each nostril as directed 30 mL 12  ? celecoxib (CELEBREX) 200 MG capsule Take  200 mg by mouth daily.    ? fluticasone (FLONASE) 50 MCG/ACT nasal spray Place 2 sprays into both nostrils daily. 16 g 6  ? glucose blood test strip Check once daily, diagnosis code E11.9, dispense based on patient and insurance preferences 100 each 12  ? hydrochlorothiazide (HYDRODIURIL) 25 MG tablet Take 1 tablet (25 mg total) by mouth daily. 90 tablet 1  ? hydrocortisone 2.5 % lotion Apply topically daily as needed.    ? omeprazole (PRILOSEC) 40 MG capsule TAKE 1 CAPSULE(40 MG) BY MOUTH DAILY 90 capsule  3  ? telmisartan (MICARDIS) 80 MG tablet Take 1 tablet (80 mg total) by mouth daily. 90 tablet 1  ? HYDROcodone-acetaminophen (NORCO/VICODIN) 5-325 MG tablet  (Patient not taking: Reported on 01/11/2022)    ? hydrocortisone (ANUSOL-HC) 25 MG suppository Place 1 suppository (25 mg total) rectally at bedtime. 30 suppository 3  ? ketoconazole (NIZORAL) 2 % cream Apply topically.    ? ondansetron (ZOFRAN-ODT) 4 MG disintegrating tablet Take 4 mg by mouth every 8 (eight) hours as needed. (Patient not taking: Reported on 12/28/2021)    ? ?Current Facility-Administered Medications  ?Medication Dose Route Frequency Provider Last Rate Last Admin  ? 0.9 %  sodium chloride infusion  500 mL Intravenous Once Betsaida [place], Venia Minks, MD      ? ? ?Allergies as of 01/11/2022 - Review Complete 01/11/2022  ?Allergen Reaction Noted  ? Molds & smuts  07/15/2018  ? ? ?Family History  ?Problem Relation Age of Onset  ? Lung cancer Mother   ? COPD Mother   ? Lung cancer Father   ? Stroke Father   ? Spina bifida Sister   ? Cirrhosis Brother   ? Colon cancer Neg Hx   ? Esophageal cancer Neg Hx   ? Rectal cancer Neg Hx   ? Stomach cancer Neg Hx   ? Colon polyps Neg Hx   ? ? ?Social History  ? ?Socioeconomic History  ? Marital status: Married  ?  Spouse name: Not on file  ? Number of children: 1  ? Years of education: Not on file  ? Highest education level: Not on file  ?Occupational History  ? Not on file  ?Tobacco Use  ? Smoking status: Former  ?  Packs/day: 2.00  ?  Years: 36.00  ?  Pack years: 72.00  ?  Types: Cigarettes  ?  Quit date: 2004  ?  Years since quitting: 19.2  ? Smokeless tobacco: Never  ?Vaping Use  ? Vaping Use: Never used  ?Substance and Sexual Activity  ? Alcohol use: Yes  ?  Alcohol/week: 5.0 standard drinks  ?  Types: 5 Shots of liquor per week  ?  Comment: occasional  ? Drug use: No  ? Sexual activity: Not on file  ?Other Topics Concern  ? Not on file  ?Social History Narrative  ? Not on file  ? ?Social Determinants of Health   ? ?Financial Resource Strain: Not on file  ?Food Insecurity: Not on file  ?Transportation Needs: Not on file  ?Physical Activity: Not on file  ?Stress: Not on file  ?Social Connections: Not on file  ?Intimate Partner Violence: Not on file  ? ? ?Review of Systems: ? ?All other review of systems negative except as mentioned in the HPI. ? ?Physical Exam: ?Vital signs in last 24 hours: ?BP 105/67   Pulse 85   Temp (!) 97.3 ?F (36.3 ?C) (Temporal)   Ht 5' 8"  (1.727 m)   Wt  188 lb (85.3 kg)   SpO2 96%   BMI 28.59 kg/m?  ?General:   Alert, NAD ?Lungs:  Clear .   ?Heart:  Regular rate and rhythm ?Abdomen:  Soft, nontender and nondistended. ?Neuro/Psych:  Alert and cooperative. Normal mood and affect. A and O x 3 ? ?Reviewed labs, radiology imaging, old records and pertinent past GI work up ? ?Patient is appropriate for planned procedure(s) and anesthesia in an ambulatory setting ? ? ?K. Denzil Magnuson , MD ?[phone]  ? ? ?  ?

## 2022-01-13 ENCOUNTER — Telehealth: Payer: Self-pay | Admitting: *Deleted

## 2022-01-13 NOTE — Telephone Encounter (Signed)
?  Follow up Call- ? ? ?  01/11/2022  ?  8:26 AM  ?Call back number  ?Post procedure Call Back phone  # [phone]  ?Permission to leave phone message Yes  ?  ? ?Patient questions: ? ?Do you have a fever, pain , or abdominal swelling? No. ?Pain Score  0 * ? ?Have you tolerated food without any problems? Yes.   ? ?Have you been able to return to your normal activities? Yes.   ? ?Do you have any questions about your discharge instructions: ?Diet   No. ?Medications  No. ?Follow up visit  No. ? ?Do you have questions or concerns about your Care? No. ? ?Actions: ?* If pain score is 4 or above: ?No action needed, pain <4. ? ? ?

## 2022-01-19 ENCOUNTER — Encounter: Payer: Self-pay | Admitting: Gastroenterology

## 2022-01-20 ENCOUNTER — Other Ambulatory Visit: Payer: Self-pay | Admitting: Family Medicine

## 2022-01-20 DIAGNOSIS — E785 Hyperlipidemia, unspecified: Secondary | ICD-10-CM

## 2022-02-12 ENCOUNTER — Other Ambulatory Visit: Payer: Self-pay | Admitting: Family Medicine

## 2022-02-12 DIAGNOSIS — I10 Essential (primary) hypertension: Secondary | ICD-10-CM

## 2022-02-13 ENCOUNTER — Other Ambulatory Visit: Payer: Self-pay | Admitting: Family Medicine

## 2022-02-13 DIAGNOSIS — I10 Essential (primary) hypertension: Secondary | ICD-10-CM

## 2022-05-01 DIAGNOSIS — Z7689 Persons encountering health services in other specified circumstances: Secondary | ICD-10-CM | POA: Diagnosis not present

## 2022-05-11 DIAGNOSIS — Z6829 Body mass index (BMI) 29.0-29.9, adult: Secondary | ICD-10-CM | POA: Diagnosis not present

## 2022-05-11 DIAGNOSIS — R6882 Decreased libido: Secondary | ICD-10-CM | POA: Diagnosis not present

## 2022-05-11 DIAGNOSIS — E291 Testicular hypofunction: Secondary | ICD-10-CM | POA: Diagnosis not present

## 2022-05-11 DIAGNOSIS — N529 Male erectile dysfunction, unspecified: Secondary | ICD-10-CM | POA: Diagnosis not present

## 2022-05-11 DIAGNOSIS — M2550 Pain in unspecified joint: Secondary | ICD-10-CM | POA: Diagnosis not present

## 2022-05-11 DIAGNOSIS — R69 Illness, unspecified: Secondary | ICD-10-CM | POA: Diagnosis not present

## 2022-05-19 ENCOUNTER — Encounter: Payer: Self-pay | Admitting: Oncology

## 2022-05-22 ENCOUNTER — Encounter: Payer: Self-pay | Admitting: Family Medicine

## 2022-05-22 DIAGNOSIS — I10 Essential (primary) hypertension: Secondary | ICD-10-CM

## 2022-05-22 DIAGNOSIS — E119 Type 2 diabetes mellitus without complications: Secondary | ICD-10-CM

## 2022-05-22 DIAGNOSIS — E785 Hyperlipidemia, unspecified: Secondary | ICD-10-CM

## 2022-05-24 ENCOUNTER — Encounter: Payer: Self-pay | Admitting: Oncology

## 2022-05-24 DIAGNOSIS — I10 Essential (primary) hypertension: Secondary | ICD-10-CM | POA: Diagnosis not present

## 2022-05-24 DIAGNOSIS — E785 Hyperlipidemia, unspecified: Secondary | ICD-10-CM | POA: Diagnosis not present

## 2022-05-24 DIAGNOSIS — E119 Type 2 diabetes mellitus without complications: Secondary | ICD-10-CM | POA: Diagnosis not present

## 2022-05-25 LAB — COMPREHENSIVE METABOLIC PANEL
ALT: 44 IU/L (ref 0–44)
AST: 31 IU/L (ref 0–40)
Albumin/Globulin Ratio: 2 (ref 1.2–2.2)
Albumin: 4.5 g/dL (ref 3.9–4.9)
Alkaline Phosphatase: 59 IU/L (ref 44–121)
BUN/Creatinine Ratio: 12 (ref 10–24)
BUN: 14 mg/dL (ref 8–27)
Bilirubin Total: 0.6 mg/dL (ref 0.0–1.2)
CO2: 23 mmol/L (ref 20–29)
Calcium: 10.1 mg/dL (ref 8.6–10.2)
Chloride: 99 mmol/L (ref 96–106)
Creatinine, Ser: 1.16 mg/dL (ref 0.76–1.27)
Globulin, Total: 2.3 g/dL (ref 1.5–4.5)
Glucose: 111 mg/dL — ABNORMAL HIGH (ref 70–99)
Potassium: 4 mmol/L (ref 3.5–5.2)
Sodium: 143 mmol/L (ref 134–144)
Total Protein: 6.8 g/dL (ref 6.0–8.5)
eGFR: 70 mL/min/{1.73_m2} (ref >59)

## 2022-05-25 LAB — LIPID PANEL
Chol/HDL Ratio: 3.2 ratio (ref 0.0–5.0)
Cholesterol, Total: 181 mg/dL (ref 100–199)
HDL: 56 mg/dL (ref >39)
LDL Chol Calc (NIH): 87 mg/dL (ref 0–99)
Triglycerides: 232 mg/dL — ABNORMAL HIGH (ref 0–149)
VLDL Cholesterol Cal: 38 mg/dL (ref 5–40)

## 2022-05-25 LAB — HEMOGLOBIN A1C
Est. average glucose Bld gHb Est-mCnc: 117 mg/dL
Hgb A1c MFr Bld: 5.7 % — ABNORMAL HIGH (ref 4.8–5.6)

## 2022-05-26 ENCOUNTER — Encounter: Payer: Self-pay | Admitting: Family Medicine

## 2022-05-26 ENCOUNTER — Ambulatory Visit (INDEPENDENT_AMBULATORY_CARE_PROVIDER_SITE_OTHER): Payer: Medicare HMO | Admitting: Family Medicine

## 2022-05-26 VITALS — BP 130/70 | HR 92 | Temp 98.4°F | Ht 68.0 in | Wt 201.4 lb

## 2022-05-26 DIAGNOSIS — R7303 Prediabetes: Secondary | ICD-10-CM

## 2022-05-26 DIAGNOSIS — I10 Essential (primary) hypertension: Secondary | ICD-10-CM | POA: Diagnosis not present

## 2022-05-26 DIAGNOSIS — I671 Cerebral aneurysm, nonruptured: Secondary | ICD-10-CM | POA: Diagnosis not present

## 2022-05-26 DIAGNOSIS — E785 Hyperlipidemia, unspecified: Secondary | ICD-10-CM

## 2022-05-26 DIAGNOSIS — Z23 Encounter for immunization: Secondary | ICD-10-CM

## 2022-05-26 NOTE — Patient Instructions (Signed)
Nice to see you. Please try to add in some vegetables to your diet.  You also should reduce the fatty meat intake. Somebody should contact you to schedule the MRA of your head.  If you do not hear from [place] within a week or 2 to schedule this please let us know.

## 2022-05-26 NOTE — Assessment & Plan Note (Signed)
Discussed getting an MRA of his head to follow-up on this.  He reports he had some metal removed from one of his eyes back in the 80s though he has had multiple MRIs since then and not had any issues.

## 2022-05-26 NOTE — Assessment & Plan Note (Signed)
LDL is adequately controlled at this time.  He will continue Lipitor 80 mg once daily.  Discussed that he needs to eat more vegetables and less fatty foods.

## 2022-05-26 NOTE — Assessment & Plan Note (Signed)
Patient was able to get his A1c down into the normal range with diet and exercise changes.  His A1c has worsened slightly.  We did discuss dietary changes.  Discussed that he needs to eat vegetables though he is concerned about this with the carbohydrates that are in vegetables.  Discussed limiting fatty meats.  Encouraged continued exercise.

## 2022-05-26 NOTE — Progress Notes (Signed)
[name], MD Phone: [phone]  Collin Mills is a 66 y.o. male who presents today for f/u.  HYPERTENSION Disease Monitoring Home BP Monitoring generally <130/80 Chest pain- no    Dyspnea- no Medications Compliance-  taking telmisartan.  Edema- no BMET    Component Value Date/Time   NA 143 05/24/2022 0714   NA 142 12/05/2011 0820   K 4.0 05/24/2022 0714   K 4.0 12/05/2011 0820   CL 99 05/24/2022 0714   CL 107 12/05/2011 0820   CO2 23 05/24/2022 0714   CO2 28 12/05/2011 0820   GLUCOSE 111 (H) 05/24/2022 0714   GLUCOSE 114 (H) 01/13/2020 1639   GLUCOSE 81 12/05/2011 0820   BUN 14 05/24/2022 0714   BUN 10 12/05/2011 0820   CREATININE 1.16 05/24/2022 0714   CREATININE 0.92 12/05/2011 0820   CALCIUM 10.1 05/24/2022 0714   CALCIUM 9.5 12/05/2011 0820   GFRNONAA 79 07/30/2020 0721   GFRNONAA >60 12/05/2011 0820   GFRAA 91 07/30/2020 0721   GFRAA >60 12/05/2011 0820   HYPERLIPIDEMIA Symptoms Chest pain on exertion:  no Medications: Compliance- taking lipitor Right upper quadrant pain- no  Muscle aches- no Lipid Panel     Component Value Date/Time   CHOL 181 05/24/2022 0714   TRIG 232 (H) 05/24/2022 0714   HDL 56 05/24/2022 0714   CHOLHDL 3.2 05/24/2022 0714   CHOLHDL 3 01/18/2018 0758   VLDL 25.2 01/18/2018 0758   LDLCALC 87 05/24/2022 0714   LDLDIRECT 74 10/29/2020 0705   LABVLDL 38 05/24/2022 0714   Prediabetes: Patient's A1c came back at 5.7.  He has gone down into the normal range previously with diet and exercise.  He notes no polyuria or polydipsia.  He is doing a keto diet.  He is eating a lot of bacon, eggs, and sausage.  He eats a lot of hotdogs.  Not much bread or pasta.  Not many vegetables.  He eats lots of chicken, barbecue, and hamburgers.  Has been going to the gym and lifting weights 3 days a week.  Cerebral aneurysm: History of this in the past.  He previously saw neurology.  They did recommend follow-up back in 2019 though does not look  like he went back to see them and he reports they advised that he did not need to come back.  They did mention getting an MRA to evaluate for growth at that time.  Patient notes some minimal infrequent tension headaches.  Social History   Tobacco Use  Smoking Status Former   Packs/day: 2.00   Years: 36.00   Total pack years: 72.00   Types: Cigarettes   Quit date: 2004   Years since quitting: 19.6  Smokeless Tobacco Never    Current Outpatient Medications on File Prior to Visit  Medication Sig Dispense Refill   atorvastatin (LIPITOR) 80 MG tablet TAKE 1 TABLET(80 MG) BY MOUTH DAILY 90 tablet 1   azelastine (ASTELIN) 0.1 % nasal spray Place 2 sprays into both nostrils 2 (two) times daily. Use in each nostril as directed 30 mL 12   celecoxib (CELEBREX) 200 MG capsule Take 200 mg by mouth daily.     fluticasone (FLONASE) 50 MCG/ACT nasal spray Place 2 sprays into both nostrils daily. 16 g 6   glucose blood test strip Check once daily, diagnosis code E11.9, dispense based on patient and insurance preferences 100 each 12   HYDROcodone-acetaminophen (NORCO/VICODIN) 5-325 MG tablet      hydrocortisone (ANUSOL-HC) 25 MG suppository Place  1 suppository (25 mg total) rectally at bedtime. 30 suppository 3   hydrocortisone 2.5 % lotion Apply topically daily as needed.     ketoconazole (NIZORAL) 2 % cream Apply topically.     omeprazole (PRILOSEC) 40 MG capsule TAKE 1 CAPSULE(40 MG) BY MOUTH DAILY 90 capsule 3   telmisartan (MICARDIS) 80 MG tablet TAKE 1 TABLET(80 MG) BY MOUTH DAILY 90 tablet 1   ondansetron (ZOFRAN-ODT) 4 MG disintegrating tablet Take 4 mg by mouth every 8 (eight) hours as needed. (Patient not taking: Reported on 05/26/2022)     sildenafil (REVATIO) 20 MG tablet TAKE 1 TO 4 TABLETS BY MOUTH AS NEEDED     No current facility-administered medications on file prior to visit.     ROS see history of present illness  Objective  Physical Exam Vitals:   05/26/22 0851  BP: 130/70   Pulse: 92  Temp: 98.4 F (36.9 C)  SpO2: 97%    BP Readings from Last 3 Encounters:  05/26/22 130/70  01/11/22 117/71  11/25/21 110/80   Wt Readings from Last 3 Encounters:  05/26/22 201 lb 6.4 oz (91.4 kg)  01/11/22 188 lb (85.3 kg)  12/28/21 188 lb (85.3 kg)    Physical Exam Constitutional:      General: He is not in acute distress.    Appearance: He is not diaphoretic.  Cardiovascular:     Rate and Rhythm: Normal rate and regular rhythm.     Heart sounds: Normal heart sounds.  Pulmonary:     Effort: Pulmonary effort is normal.     Breath sounds: Normal breath sounds.  Skin:    General: Skin is warm and dry.  Neurological:     Mental Status: He is alert.      Assessment/Plan: Please see individual problem list.  Problem List Items Addressed This Visit     Cerebral aneurysm (Chronic)    Discussed getting an MRA of his head to follow-up on this.  He reports he had some metal removed from one of his eyes back in the 80s though he has had multiple MRIs since then and not had any issues.      Relevant Medications   sildenafil (REVATIO) 20 MG tablet   Other Relevant Orders   MR Angiogram Head Wo Contrast   Essential hypertension (Chronic)    Adequately controlled.  He will continue telmisartan 80 mg once daily.      Relevant Medications   sildenafil (REVATIO) 20 MG tablet   Hyperlipidemia (Chronic)    LDL is adequately controlled at this time.  He will continue Lipitor 80 mg once daily.  Discussed that he needs to eat more vegetables and less fatty foods.      Relevant Medications   sildenafil (REVATIO) 20 MG tablet   Prediabetes (Chronic)    Patient was able to get his A1c down into the normal range with diet and exercise changes.  His A1c has worsened slightly.  We did discuss dietary changes.  Discussed that he needs to eat vegetables though he is concerned about this with the carbohydrates that are in vegetables.  Discussed limiting fatty meats.   Encouraged continued exercise.      Other Visit Diagnoses     Need for pneumococcal 20-valent conjugate vaccination    -  Primary   Relevant Orders   Pneumococcal conjugate vaccine 20-valent (Prevnar 20) (Completed)        Health Maintenance: prevnar20 given today.   Return in about 6 months (around  11/26/2022) for Diabetes/hypertension.   [name], MD [place]

## 2022-05-26 NOTE — Assessment & Plan Note (Signed)
Adequately controlled.  He will continue telmisartan 80 mg once daily.

## 2022-06-08 ENCOUNTER — Ambulatory Visit
Admission: RE | Admit: 2022-06-08 | Discharge: 2022-06-08 | Disposition: A | Discharge status: Home / Self Care | Payer: Medicare HMO | Source: Ambulatory Visit | Attending: Family Medicine | Admitting: Family Medicine

## 2022-06-08 DIAGNOSIS — I671 Cerebral aneurysm, nonruptured: Secondary | ICD-10-CM

## 2022-06-12 DIAGNOSIS — N529 Male erectile dysfunction, unspecified: Secondary | ICD-10-CM | POA: Diagnosis not present

## 2022-06-12 DIAGNOSIS — E291 Testicular hypofunction: Secondary | ICD-10-CM | POA: Diagnosis not present

## 2022-06-14 DIAGNOSIS — R6882 Decreased libido: Secondary | ICD-10-CM | POA: Diagnosis not present

## 2022-06-14 DIAGNOSIS — R69 Illness, unspecified: Secondary | ICD-10-CM | POA: Diagnosis not present

## 2022-06-14 DIAGNOSIS — Z6830 Body mass index (BMI) 30.0-30.9, adult: Secondary | ICD-10-CM | POA: Diagnosis not present

## 2022-06-14 DIAGNOSIS — E291 Testicular hypofunction: Secondary | ICD-10-CM | POA: Diagnosis not present

## 2022-06-14 DIAGNOSIS — G479 Sleep disorder, unspecified: Secondary | ICD-10-CM | POA: Diagnosis not present

## 2022-06-25 ENCOUNTER — Other Ambulatory Visit: Payer: Self-pay | Admitting: Family Medicine

## 2022-06-25 DIAGNOSIS — E785 Hyperlipidemia, unspecified: Secondary | ICD-10-CM

## 2022-07-07 DIAGNOSIS — Z87891 Personal history of nicotine dependence: Secondary | ICD-10-CM | POA: Diagnosis not present

## 2022-07-07 DIAGNOSIS — Z008 Encounter for other general examination: Secondary | ICD-10-CM | POA: Diagnosis not present

## 2022-07-07 DIAGNOSIS — M1990 Unspecified osteoarthritis, unspecified site: Secondary | ICD-10-CM | POA: Diagnosis not present

## 2022-07-07 DIAGNOSIS — Z791 Long term (current) use of non-steroidal anti-inflammatories (NSAID): Secondary | ICD-10-CM | POA: Diagnosis not present

## 2022-07-07 DIAGNOSIS — E119 Type 2 diabetes mellitus without complications: Secondary | ICD-10-CM | POA: Diagnosis not present

## 2022-07-07 DIAGNOSIS — R030 Elevated blood-pressure reading, without diagnosis of hypertension: Secondary | ICD-10-CM | POA: Diagnosis not present

## 2022-07-07 DIAGNOSIS — K219 Gastro-esophageal reflux disease without esophagitis: Secondary | ICD-10-CM | POA: Diagnosis not present

## 2022-07-07 DIAGNOSIS — Z825 Family history of asthma and other chronic lower respiratory diseases: Secondary | ICD-10-CM | POA: Diagnosis not present

## 2022-07-07 DIAGNOSIS — Z823 Family history of stroke: Secondary | ICD-10-CM | POA: Diagnosis not present

## 2022-07-07 DIAGNOSIS — Z809 Family history of malignant neoplasm, unspecified: Secondary | ICD-10-CM | POA: Diagnosis not present

## 2022-07-07 DIAGNOSIS — N529 Male erectile dysfunction, unspecified: Secondary | ICD-10-CM | POA: Diagnosis not present

## 2022-07-07 DIAGNOSIS — I10 Essential (primary) hypertension: Secondary | ICD-10-CM | POA: Diagnosis not present

## 2022-07-07 DIAGNOSIS — J309 Allergic rhinitis, unspecified: Secondary | ICD-10-CM | POA: Diagnosis not present

## 2022-07-07 DIAGNOSIS — E785 Hyperlipidemia, unspecified: Secondary | ICD-10-CM | POA: Diagnosis not present

## 2022-08-06 ENCOUNTER — Other Ambulatory Visit: Payer: Self-pay | Admitting: Family Medicine

## 2022-08-06 DIAGNOSIS — I10 Essential (primary) hypertension: Secondary | ICD-10-CM

## 2022-08-16 ENCOUNTER — Other Ambulatory Visit: Payer: Self-pay | Admitting: Family Medicine

## 2022-08-16 DIAGNOSIS — I10 Essential (primary) hypertension: Secondary | ICD-10-CM

## 2022-08-21 DIAGNOSIS — L218 Other seborrheic dermatitis: Secondary | ICD-10-CM | POA: Diagnosis not present

## 2022-08-21 DIAGNOSIS — D2272 Melanocytic nevi of left lower limb, including hip: Secondary | ICD-10-CM | POA: Diagnosis not present

## 2022-08-21 DIAGNOSIS — D2262 Melanocytic nevi of left upper limb, including shoulder: Secondary | ICD-10-CM | POA: Diagnosis not present

## 2022-08-21 DIAGNOSIS — L570 Actinic keratosis: Secondary | ICD-10-CM | POA: Diagnosis not present

## 2022-08-21 DIAGNOSIS — D2271 Melanocytic nevi of right lower limb, including hip: Secondary | ICD-10-CM | POA: Diagnosis not present

## 2022-08-21 DIAGNOSIS — L821 Other seborrheic keratosis: Secondary | ICD-10-CM | POA: Diagnosis not present

## 2022-08-21 DIAGNOSIS — D2261 Melanocytic nevi of right upper limb, including shoulder: Secondary | ICD-10-CM | POA: Diagnosis not present

## 2022-09-10 ENCOUNTER — Encounter: Payer: Self-pay | Admitting: Family Medicine

## 2022-09-10 DIAGNOSIS — I10 Essential (primary) hypertension: Secondary | ICD-10-CM

## 2022-09-12 MED ORDER — HYDROCHLOROTHIAZIDE 25 MG PO TABS: ORAL_TABLET | ORAL | 1 refills | Status: DC

## 2022-10-10 DIAGNOSIS — E291 Testicular hypofunction: Secondary | ICD-10-CM | POA: Diagnosis not present

## 2022-10-11 DIAGNOSIS — Z79890 Hormone replacement therapy (postmenopausal): Secondary | ICD-10-CM | POA: Diagnosis not present

## 2022-10-11 DIAGNOSIS — M2550 Pain in unspecified joint: Secondary | ICD-10-CM | POA: Diagnosis not present

## 2022-10-11 DIAGNOSIS — Z6830 Body mass index (BMI) 30.0-30.9, adult: Secondary | ICD-10-CM | POA: Diagnosis not present

## 2022-10-11 DIAGNOSIS — E291 Testicular hypofunction: Secondary | ICD-10-CM | POA: Diagnosis not present

## 2022-10-11 DIAGNOSIS — N529 Male erectile dysfunction, unspecified: Secondary | ICD-10-CM | POA: Diagnosis not present

## 2022-10-18 ENCOUNTER — Encounter: Payer: Self-pay | Admitting: Gastroenterology

## 2022-11-13 DIAGNOSIS — L570 Actinic keratosis: Secondary | ICD-10-CM | POA: Diagnosis not present

## 2022-11-21 ENCOUNTER — Encounter: Payer: Self-pay | Admitting: Oncology

## 2022-11-28 ENCOUNTER — Encounter: Payer: Self-pay | Admitting: Family Medicine

## 2022-11-28 ENCOUNTER — Telehealth: Payer: Self-pay

## 2022-11-28 ENCOUNTER — Ambulatory Visit (INDEPENDENT_AMBULATORY_CARE_PROVIDER_SITE_OTHER): Payer: No Typology Code available for payment source | Admitting: Family Medicine

## 2022-11-28 VITALS — BP 128/78 | HR 84 | Temp 97.7°F | Ht 68.0 in | Wt 199.4 lb

## 2022-11-28 DIAGNOSIS — E785 Hyperlipidemia, unspecified: Secondary | ICD-10-CM | POA: Diagnosis not present

## 2022-11-28 DIAGNOSIS — R7303 Prediabetes: Secondary | ICD-10-CM | POA: Diagnosis not present

## 2022-11-28 DIAGNOSIS — I10 Essential (primary) hypertension: Secondary | ICD-10-CM | POA: Diagnosis not present

## 2022-11-28 DIAGNOSIS — Z125 Encounter for screening for malignant neoplasm of prostate: Secondary | ICD-10-CM

## 2022-11-28 NOTE — Telephone Encounter (Signed)
Patient states at [facility] that he had a Medicare Well Visit at his home at age 67.  I did not schedule another AWV for patient.  Patient states he will have his labs drawn at [facility], so I did not schedule his lab visit.

## 2022-11-28 NOTE — Assessment & Plan Note (Signed)
Chronic issue.  Seems to be generally well-controlled though he is not checking his blood pressure frequently.  Discussed a goal of less than 130/80.  Advised to start checking his blood pressure more frequently at home.  He will continue HCTZ 25 mg daily and telmisartan 80 mg daily.

## 2022-11-28 NOTE — Addendum Note (Signed)
Addended by: Neta Ehlers on: 11/28/2022 08:55 AM   Modules accepted: Orders

## 2022-11-28 NOTE — Assessment & Plan Note (Signed)
Chronic issue.  Check LDL.  He will continue Lipitor 80 mg daily.

## 2022-11-28 NOTE — Patient Instructions (Signed)
Please start checking your blood pressure more frequently.  Your goal blood pressure is less than 130/80.  Please let me know if your blood pressure is not at goal consistently.

## 2022-11-28 NOTE — Assessment & Plan Note (Addendum)
Chronic issue.  Check A1c.  He will continue diet and exercise.

## 2022-11-28 NOTE — Progress Notes (Signed)
[name], MD Phone: [phone]  Collin Mills is a 67 y.o. male who presents today for f/u.  HYPERTENSION Disease Monitoring: Blood pressure range-has not checked since December, generally in a good range when he does check though occasionally diastolic in the 85I Chest pain- no      Dyspnea- no Medications: Compliance- taking HCTZ, telmisartan   Edema- no  Prediabetes: No polyuria or polydipsia.  He exercises by lifting weights 3 times a week and walking most days.  He is doing a keto diet.  HYPERLIPIDEMIA Disease Monitoring: See symptoms for Hypertension Medications: Compliance- taking lipitor Right upper quadrant pain- no  Muscle aches- no    Social History   Tobacco Use  Smoking Status Former   Packs/day: 2.00   Years: 36.00   Total pack years: 72.00   Types: Cigarettes   Quit date: 2004   Years since quitting: 20.1  Smokeless Tobacco Never    Current Outpatient Medications on File Prior to Visit  Medication Sig Dispense Refill   atorvastatin (LIPITOR) 80 MG tablet TAKE 1 TABLET(80 MG) BY MOUTH DAILY 90 tablet 1   azelastine (ASTELIN) 0.1 % nasal spray Place 2 sprays into both nostrils 2 (two) times daily. Use in each nostril as directed 30 mL 12   celecoxib (CELEBREX) 200 MG capsule Take 200 mg by mouth daily.     fluticasone (FLONASE) 50 MCG/ACT nasal spray Place 2 sprays into both nostrils daily. 16 g 6   glucose blood test strip Check once daily, diagnosis code E11.9, dispense based on patient and insurance preferences 100 each 12   hydrochlorothiazide (HYDRODIURIL) 25 MG tablet TAKE 1 TABLET(25 MG) BY MOUTH DAILY 90 tablet 1   hydrocortisone (ANUSOL-HC) 25 MG suppository Place 1 suppository (25 mg total) rectally at bedtime. 30 suppository 3   hydrocortisone 2.5 % lotion Apply topically daily as needed.     ketoconazole (NIZORAL) 2 % cream Apply topically.     omeprazole (PRILOSEC) 40 MG capsule TAKE 1 CAPSULE(40 MG) BY MOUTH DAILY 90 capsule 3    tadalafil (CIALIS) 20 MG tablet Take 20 mg by mouth daily as needed.     telmisartan (MICARDIS) 80 MG tablet TAKE 1 TABLET(80 MG) BY MOUTH DAILY 90 tablet 1   No current facility-administered medications on file prior to visit.     ROS see history of present illness  Objective  Physical Exam Vitals:   11/28/22 0820 11/28/22 0835  BP: 130/70 128/78  Pulse: 84   Temp: 97.7 F (36.5 C)   SpO2: 95%     BP Readings from Last 3 Encounters:  11/28/22 128/78  05/26/22 130/70  01/11/22 117/71   Wt Readings from Last 3 Encounters:  11/28/22 199 lb 6.4 oz (90.4 kg)  05/26/22 201 lb 6.4 oz (91.4 kg)  01/11/22 188 lb (85.3 kg)    Physical Exam Constitutional:      General: He is not in acute distress.    Appearance: He is not diaphoretic.  Cardiovascular:     Rate and Rhythm: Normal rate and regular rhythm.     Heart sounds: Normal heart sounds.  Pulmonary:     Effort: Pulmonary effort is normal.     Breath sounds: Normal breath sounds.  Musculoskeletal:     Right lower leg: No edema.     Left lower leg: No edema.  Skin:    General: Skin is warm and dry.  Neurological:     Mental Status: He is alert.  Assessment/Plan: Please see individual problem list.  Prediabetes Assessment & Plan: Chronic issue.  Check A1c.  He will continue diet and exercise.  Orders: -     Hemoglobin A1c -     Hemoglobin A1c  Hyperlipidemia, unspecified hyperlipidemia type Assessment & Plan: Chronic issue.  Check LDL.  He will continue Lipitor 80 mg daily.  Orders: -     Comprehensive metabolic panel -     LDL cholesterol, direct -     Comprehensive metabolic panel -     Lipid panel  Essential hypertension Assessment & Plan: Chronic issue.  Seems to be generally well-controlled though he is not checking his blood pressure frequently.  Discussed a goal of less than 130/80.  Advised to start checking his blood pressure more frequently at home.  He will continue HCTZ 25 mg daily  and telmisartan 80 mg daily.  Orders: -     Comprehensive metabolic panel -     Comprehensive metabolic panel  Prostate cancer screening -     PSA    Return in about 6 months (around 05/29/2023) for physical, labs 2 days prior, can be scheduled for medicare wellness visit whenever.   [name], MD [place]

## 2022-11-29 LAB — COMPREHENSIVE METABOLIC PANEL
ALT: 60 IU/L — ABNORMAL HIGH (ref 0–44)
AST: 43 IU/L — ABNORMAL HIGH (ref 0–40)
Albumin/Globulin Ratio: 2.1 (ref 1.2–2.2)
Albumin: 5 g/dL — ABNORMAL HIGH (ref 3.9–4.9)
Alkaline Phosphatase: 51 IU/L (ref 44–121)
BUN/Creatinine Ratio: 10 (ref 10–24)
BUN: 9 mg/dL (ref 8–27)
Bilirubin Total: 0.5 mg/dL (ref 0.0–1.2)
CO2: 24 mmol/L (ref 20–29)
Calcium: 10 mg/dL (ref 8.6–10.2)
Chloride: 93 mmol/L — ABNORMAL LOW (ref 96–106)
Creatinine, Ser: 0.86 mg/dL (ref 0.76–1.27)
Globulin, Total: 2.4 g/dL (ref 1.5–4.5)
Glucose: 81 mg/dL (ref 70–99)
Potassium: 4.3 mmol/L (ref 3.5–5.2)
Sodium: 138 mmol/L (ref 134–144)
Total Protein: 7.4 g/dL (ref 6.0–8.5)
eGFR: 96 mL/min/{1.73_m2} (ref >59)

## 2022-11-29 LAB — LDL CHOLESTEROL, DIRECT: LDL Direct: 95 mg/dL (ref 0–99)

## 2022-11-29 LAB — HEMOGLOBIN A1C
Est. average glucose Bld gHb Est-mCnc: 105 mg/dL
Hgb A1c MFr Bld: 5.3 % (ref 4.8–5.6)

## 2022-12-01 ENCOUNTER — Telehealth: Payer: Self-pay

## 2022-12-01 ENCOUNTER — Other Ambulatory Visit: Payer: Self-pay

## 2022-12-01 DIAGNOSIS — R7989 Other specified abnormal findings of blood chemistry: Secondary | ICD-10-CM

## 2022-12-01 NOTE — Telephone Encounter (Signed)
  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

## 2022-12-01 NOTE — Telephone Encounter (Signed)
Pt returned call. Note below was read to him. Pt aware. He said he usually goes to [place] to get his blood work done, so he will go over there March 1st.

## 2022-12-04 ENCOUNTER — Other Ambulatory Visit: Payer: Self-pay | Admitting: Family Medicine

## 2022-12-04 DIAGNOSIS — R7989 Other specified abnormal findings of blood chemistry: Secondary | ICD-10-CM

## 2022-12-05 NOTE — Telephone Encounter (Signed)
Lab order changed to [facility].  Emri Sample,cma

## 2022-12-05 NOTE — Addendum Note (Signed)
Addended by: Fulton Mole D on: 12/05/2022 09:52 AM   Modules accepted: Orders

## 2022-12-21 ENCOUNTER — Other Ambulatory Visit: Payer: Self-pay | Admitting: Family

## 2022-12-21 DIAGNOSIS — E785 Hyperlipidemia, unspecified: Secondary | ICD-10-CM

## 2022-12-27 NOTE — Progress Notes (Incomplete)
Collin Mills    WD:[id number]    [birth year]  Primary Care Physician:Sonnenberg, Angela Adam, MD  Referring Physician: Leone Haven, MD [address] [place],  [place] [address]   Chief complaint:  Elevated transaminases No chief complaint on file.    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   I last saw him on 03/24/2021. At that time he was doing well overall. He was following a keto diet and avoiding al carbs and sugars. His LFT's had improved significantly    Today,   Denies any nausea, vomiting, abdominal pain, melena or bright red blood per rectum   GI Hx:  EGD 01/11/22  - Esophageal mucosal changes secondary to established long-segment Barrett's disease, classified as Barrett's stage C3-M5 per Prague criteria. Biopsied.  - Gastritis. Biopsied.  - Normal examined duodenum. Pathology  1. Surgical [P], distal esophagus - SQUAMOCOLUMNAR MUCOSA WITH INTESTINAL METAPLASIA, CONSISTENT WITH BARRETT'S ESOPHAGUS IN THE PROPER CLINICAL CONTEXT, NEGATIVE FOR DYSPLASIA. 2. Surgical [P], gastric antrum and gastric body - GASTRIC ANTRAL-TYPE MUCOSA WITH FOCAL INTESTINAL METAPLASIA. - GASTRIC OXYNTIC MUCOSA WITH DILATED GLANDS, CONSISTENT WITH PROTON PUMP INHIBITOR EFFECT AND/OR FUNDIC GLAND POLYP FORMATION. - NEGATIVE FOR AN INFLAMMATORY INFILTRATE PREDICTIVE OF HELICOBACTER PYLORI INFECTION. - NEGATIVE FOR MALIGNANCY.  Colonoscopy 01/11/22 - Four 4 to 8 mm polyps in the transverse colon and in the ascending colon, removed with a cold snare. Resected and retrieved.  - One 2 mm polyp in the descending colon, removed with a cold biopsy forceps. Resected and retrieved. - Non-bleeding external and internal hemorrhoids.  - The examination was otherwise normal. Pathology  1. Surgical [P], colon, ascending, transverse x3, polyp (4) - TUBULAR  ADENOMAS, 6 ADENOMATOUS FRAGMENTS, NEGATIVE FOR HIGH-GRADE DYSPLASIA. 2. Surgical [P], colon, descending, polyp (1) - COLONIC MUCOSA WITH NO FEATURES DIAGNOSTIC FOR A SPECIFIC TYPE OF MUCOSAL POLYP (NEGATIVE FOR ADENOMATOUS CHANGE AND POLYPOID EPITHELIAL SERRATION). - MUCOSAL LYMPHOID AGGREGATE.  EGD November 22, 2018  showed long segment Barrett's esophagus, hiatal hernia otherwise unremarkable exam.  Biopsy showed Barrett's esophagus with no dysplasia.  Colonoscopy November 22, 2018  with removal of 4 tubular adenomas and 1 hyperplastic polyp.  Internal hemorrhoids  Capsule endoscopy December 19, 2018  showed few small nonbleeding AVM and small intestine and one small erosion.  No active bleeding.      Latest Ref Rng & Units 11/28/2022    8:55 AM 05/24/2022    7:14 AM 02/09/2021    8:52 AM  Hepatic Function  Total Protein 6.0 - 8.5 g/dL 7.4  6.8  7.3   Albumin 3.9 - 4.9 g/dL 5.0  4.5  4.7   AST 0 - 40 IU/L 43  31  22   ALT 0 - 44 IU/L 60  44  21   Alk Phosphatase 44 - 121 IU/L 51  59  83   Total Bilirubin 0.0 - 1.2 mg/dL 0.5  0.6  0.6      Iron/TIBC/Ferritin/ %Sat    Component Value Date/Time   IRON 89 07/30/2020 0717   TIBC 311 07/30/2020 0717   FERRITIN 232 07/30/2020 0717   IRONPCTSAT 29 07/30/2020 0717       Current Outpatient Medications:    atorvastatin (LIPITOR) 80 MG tablet, TAKE 1 TABLET(80 MG) BY MOUTH DAILY, Disp: 90 tablet, Rfl: 1   azelastine (ASTELIN) 0.1 %  nasal spray, Place 2 sprays into both nostrils 2 (two) times daily. Use in each nostril as directed, Disp: 30 mL, Rfl: 12   celecoxib (CELEBREX) 200 MG capsule, Take 200 mg by mouth daily., Disp: , Rfl:    fluticasone (FLONASE) 50 MCG/ACT nasal spray, Place 2 sprays into both nostrils daily., Disp: 16 g, Rfl: 6   glucose blood test strip, Check once daily, diagnosis code E11.9, dispense based on patient and insurance preferences, Disp: 100 each, Rfl: 12   hydrochlorothiazide (HYDRODIURIL) 25 MG tablet,  TAKE 1 TABLET(25 MG) BY MOUTH DAILY, Disp: 90 tablet, Rfl: 1   hydrocortisone (ANUSOL-HC) 25 MG suppository, Place 1 suppository (25 mg total) rectally at bedtime., Disp: 30 suppository, Rfl: 3   hydrocortisone 2.5 % lotion, Apply topically daily as needed., Disp: , Rfl:    ketoconazole (NIZORAL) 2 % cream, Apply topically., Disp: , Rfl:    omeprazole (PRILOSEC) 40 MG capsule, TAKE 1 CAPSULE(40 MG) BY MOUTH DAILY, Disp: 90 capsule, Rfl: 3   tadalafil (CIALIS) 20 MG tablet, Take 20 mg by mouth daily as needed., Disp: , Rfl:    telmisartan (MICARDIS) 80 MG tablet, TAKE 1 TABLET(80 MG) BY MOUTH DAILY, Disp: 90 tablet, Rfl: 1    Allergies as of 01/01/2023 - Review Complete 11/28/2022  Allergen Reaction Noted   Molds & smuts  07/15/2018    Past Medical History:  Diagnosis Date   Allergic rhinitis    Allergy    Anemia    giving blood every six weeks   Barrett's esophagus    Cataract    forming   Chickenpox    Colon polyps    GERD (gastroesophageal reflux disease)    Hyperlipidemia    Hypertension    Iron deficiency anemia 11/11/2018   Migraines    past hx in the 90's   Osteoarthritis    osteoarthritis    Past Surgical History:  Procedure Laterality Date   COLONOSCOPY     ESOPHAGOGASTRODUODENOSCOPY ENDOSCOPY     POLYPECTOMY     SHOULDER SURGERY Right 12/09/2021   right shoulder and bicep surgery   UPPER GASTROINTESTINAL ENDOSCOPY      Family History  Problem Relation Age of Onset   Lung cancer Mother    COPD Mother    Lung cancer Father    Stroke Father    Spina bifida Sister    Cirrhosis Brother    Colon cancer Neg Hx    Esophageal cancer Neg Hx    Rectal cancer Neg Hx    Stomach cancer Neg Hx    Colon polyps Neg Hx     Social History   Socioeconomic History   Marital status: Married    Spouse name: Not on file   Number of children: 1   Years of education: Not on file   Highest education level: Not on file  Occupational History   Not on file  Tobacco  Use   Smoking status: Former    Packs/day: 2.00    Years: 36.00    Total pack years: 72.00    Types: Cigarettes    Quit date: 2004    Years since quitting: 20.1   Smokeless tobacco: Never  Vaping Use   Vaping Use: Never used  Substance and Sexual Activity   Alcohol use: Yes    Alcohol/week: 5.0 standard drinks of alcohol    Types: 5 Shots of liquor per week    Comment: occasional   Drug use: No   Sexual activity: Not  on file  Other Topics Concern   Not on file  Social History Narrative   Not on file   Social Determinants of Health   Financial Resource Strain: Not on file  Food Insecurity: Not on file  Transportation Needs: Not on file  Physical Activity: Not on file  Stress: Not on file  Social Connections: Not on file  Intimate Partner Violence: Not on file      Review of systems: Review of Systems    Physical Exam: General: well-appearing ***  Eyes: sclera anicteric, no redness ENT: oral mucosa moist without lesions, no cervical or supraclavicular lymphadenopathy CV: RRR, no JVD, no peripheral edema Resp: clear to auscultation bilaterally, normal RR and effort noted GI: soft, no tenderness, with active bowel sounds. No guarding or palpable organomegaly noted. Skin; warm and dry, no rash or jaundice noted Neuro: awake, alert and oriented x 3. Normal gross motor function and fluent speech   Data Reviewed:  Reviewed labs, radiology imaging, old records and pertinent past GI work up   Assessment and Plan/Recommendations:  67 year old very pleasant gentleman with history of iron deficiency anemia secondary to chronic GI occult blood loss with small bowel AVMs and elevated transaminases secondary to fatty liver  Fatty liver: LFT trended down to normal range with dietary modification Continue with low-carb diet and exercise Monitor LFT every 6 months through PMD  Iron deficiency improved: Continue to monitor CBC and iron panel every 6 months through  PMD Continue to avoid NSAIDs   GERD and Barrett's esophagus with no dysplasia: Continue omeprazole 40 mg daily and antireflux measures  Due for surveillance EGD for Barrett's esophagus and colonoscopy for adenomatous colon polyps in January 2023  Return as needed  The patient was provided an opportunity to ask questions and all were answered. The patient agreed with the plan and demonstrated an understanding of the instructions.    I,Safa M Kadhim,acting as a scribe for Collin Bowie, MD.,have documented all relevant documentation on the behalf of Collin Bowie, MD,as directed by  Collin Bowie, MD while in the presence of Collin Bowie, MD.   I, Collin Bowie, MD, have reviewed all documentation for this visit. The documentation on 12/27/22 for the exam, diagnosis, procedures, and orders are all accurate and complete.   Damaris Hippo , MD    CC: Leone Haven, MD

## 2022-12-29 DIAGNOSIS — R7303 Prediabetes: Secondary | ICD-10-CM | POA: Diagnosis not present

## 2022-12-29 DIAGNOSIS — Z125 Encounter for screening for malignant neoplasm of prostate: Secondary | ICD-10-CM | POA: Diagnosis not present

## 2022-12-29 DIAGNOSIS — I10 Essential (primary) hypertension: Secondary | ICD-10-CM | POA: Diagnosis not present

## 2022-12-29 DIAGNOSIS — E785 Hyperlipidemia, unspecified: Secondary | ICD-10-CM | POA: Diagnosis not present

## 2022-12-30 LAB — LIPID PANEL
Chol/HDL Ratio: 3.7 ratio (ref 0.0–5.0)
Cholesterol, Total: 198 mg/dL (ref 100–199)
HDL: 53 mg/dL (ref >39)
LDL Chol Calc (NIH): 105 mg/dL — ABNORMAL HIGH (ref 0–99)
Triglycerides: 234 mg/dL — ABNORMAL HIGH (ref 0–149)
VLDL Cholesterol Cal: 40 mg/dL (ref 5–40)

## 2022-12-30 LAB — PSA: Prostate Specific Ag, Serum: 0.8 ng/mL (ref 0.0–4.0)

## 2023-01-01 ENCOUNTER — Other Ambulatory Visit: Payer: Self-pay | Admitting: Family Medicine

## 2023-01-01 ENCOUNTER — Ambulatory Visit (INDEPENDENT_AMBULATORY_CARE_PROVIDER_SITE_OTHER): Payer: No Typology Code available for payment source | Admitting: Gastroenterology

## 2023-01-01 ENCOUNTER — Encounter: Payer: Self-pay | Admitting: Gastroenterology

## 2023-01-01 VITALS — BP 130/68 | HR 88 | Ht 68.0 in | Wt 201.0 lb

## 2023-01-01 DIAGNOSIS — R7989 Other specified abnormal findings of blood chemistry: Secondary | ICD-10-CM

## 2023-01-01 NOTE — Progress Notes (Unsigned)
Patient left before he could be seen in the office.

## 2023-01-23 DIAGNOSIS — R7989 Other specified abnormal findings of blood chemistry: Secondary | ICD-10-CM | POA: Diagnosis not present

## 2023-01-24 LAB — HEPATIC FUNCTION PANEL
ALT: 35 IU/L (ref 0–44)
AST: 27 IU/L (ref 0–40)
Albumin: 4.8 g/dL (ref 3.9–4.9)
Alkaline Phosphatase: 47 IU/L (ref 44–121)
Bilirubin Total: 0.6 mg/dL (ref 0.0–1.2)
Bilirubin, Direct: 0.15 mg/dL (ref 0.00–0.40)
Total Protein: 7.2 g/dL (ref 6.0–8.5)

## 2023-02-07 ENCOUNTER — Telehealth: Payer: Self-pay | Admitting: Family Medicine

## 2023-02-07 ENCOUNTER — Other Ambulatory Visit: Payer: Self-pay | Admitting: Family Medicine

## 2023-02-07 DIAGNOSIS — I10 Essential (primary) hypertension: Secondary | ICD-10-CM

## 2023-02-07 MED ORDER — TELMISARTAN 80 MG PO TABS: ORAL_TABLET | ORAL | 3 refills | Status: DC

## 2023-02-07 NOTE — Telephone Encounter (Signed)
[name] from [facility] called stating pt need a refill on telmisartan sent to [facility]

## 2023-02-08 ENCOUNTER — Other Ambulatory Visit: Payer: Self-pay

## 2023-02-08 DIAGNOSIS — I10 Essential (primary) hypertension: Secondary | ICD-10-CM

## 2023-02-08 MED ORDER — TELMISARTAN 80 MG PO TABS: ORAL_TABLET | ORAL | 3 refills | Status: AC

## 2023-02-08 NOTE — Telephone Encounter (Signed)
Prescription sent in  

## 2023-02-19 DIAGNOSIS — E119 Type 2 diabetes mellitus without complications: Secondary | ICD-10-CM | POA: Diagnosis not present

## 2023-02-19 DIAGNOSIS — H2513 Age-related nuclear cataract, bilateral: Secondary | ICD-10-CM | POA: Diagnosis not present

## 2023-02-19 DIAGNOSIS — H04123 Dry eye syndrome of bilateral lacrimal glands: Secondary | ICD-10-CM | POA: Diagnosis not present

## 2023-02-27 DIAGNOSIS — H2513 Age-related nuclear cataract, bilateral: Secondary | ICD-10-CM | POA: Diagnosis not present

## 2023-03-04 ENCOUNTER — Other Ambulatory Visit: Payer: Self-pay | Admitting: Family Medicine

## 2023-03-04 DIAGNOSIS — I10 Essential (primary) hypertension: Secondary | ICD-10-CM

## 2023-03-05 ENCOUNTER — Encounter: Payer: Self-pay | Admitting: Ophthalmology

## 2023-03-05 NOTE — Anesthesia Preprocedure Evaluation (Addendum)
Anesthesia Evaluation  Patient identified by MRN, date of birth, ID band Patient awake    Reviewed: Allergy & Precautions, H&P , NPO status , Patient's Chart, lab work & pertinent test results  Airway Mallampati: II  TM Distance: >3 FB Neck ROM: Full    Dental no notable dental hx. (+) Upper Dentures, Lower Dentures   Pulmonary neg pulmonary ROS, former smoker Used to smoke 2 to 3 PPD but quit 20 years ago and denies any sequelae now, good exercise tolerance   Pulmonary exam normal breath sounds clear to auscultation       Cardiovascular Exercise Tolerance: Good hypertension, Normal cardiovascular exam Rhythm:Regular Rate:Normal     Neuro/Psych  Headaches PSYCHIATRIC DISORDERS      vertigo  negative psych ROS   GI/Hepatic Neg liver ROS,GERD  ,,Elevated LFTs Barretts esophagus controlled w/Rx. Took med today   Endo/Other  negative endocrine ROS    Renal/GU negative Renal ROS  negative genitourinary   Musculoskeletal  (+) Arthritis , Osteoarthritis,    Abdominal   Peds negative pediatric ROS (+)  Hematology  (+) Blood dyscrasia, anemia   Anesthesia Other Findings Chickenpox  GERD (gastroesophageal reflux disease) Allergic rhinitis Hypertension Hyperlipidemia  Migraines Barrett's esophagus  Colon polyps Allergy Anemia Osteoarthritis  Iron deficiency anemia Cataract  Wears dentures Vertigo     Reproductive/Obstetrics negative OB ROS                             Anesthesia Physical Anesthesia Plan  ASA: 2  Anesthesia Plan: MAC   Post-op Pain Management:    Induction: Intravenous  PONV Risk Score and Plan:   Airway Management Planned: Natural Airway and Nasal Cannula  Additional Equipment:   Intra-op Plan:   Post-operative Plan:   Informed Consent: I have reviewed the patients History and Physical, chart, labs and discussed the procedure including the risks, benefits  and alternatives for the proposed anesthesia with the patient or authorized representative who has indicated his/her understanding and acceptance.     Dental Advisory Given  Plan Discussed with: Anesthesiologist, CRNA and Surgeon  Anesthesia Plan Comments: (Patient consented for risks of anesthesia including but not limited to:  - adverse reactions to medications - damage to eyes, teeth, lips or other oral mucosa - nerve damage due to positioning  - sore throat or hoarseness - Damage to heart, brain, nerves, lungs, other parts of body or loss of life  Patient voiced understanding.)       Anesthesia Quick Evaluation

## 2023-03-07 ENCOUNTER — Other Ambulatory Visit: Payer: Self-pay

## 2023-03-07 ENCOUNTER — Ambulatory Visit: Payer: No Typology Code available for payment source | Admitting: Anesthesiology

## 2023-03-07 ENCOUNTER — Ambulatory Visit
Admission: RE | Admit: 2023-03-07 | Discharge: 2023-03-07 | Disposition: A | Discharge status: Home / Self Care | Payer: No Typology Code available for payment source | Attending: Ophthalmology | Admitting: Ophthalmology

## 2023-03-07 ENCOUNTER — Encounter: Payer: Self-pay | Admitting: Ophthalmology

## 2023-03-07 ENCOUNTER — Encounter
Admission: RE | Disposition: A | Discharge status: Home / Self Care | Payer: Self-pay | Source: Home / Self Care | Attending: Ophthalmology

## 2023-03-07 DIAGNOSIS — H269 Unspecified cataract: Secondary | ICD-10-CM | POA: Diagnosis not present

## 2023-03-07 DIAGNOSIS — D759 Disease of blood and blood-forming organs, unspecified: Secondary | ICD-10-CM | POA: Diagnosis not present

## 2023-03-07 DIAGNOSIS — K219 Gastro-esophageal reflux disease without esophagitis: Secondary | ICD-10-CM | POA: Insufficient documentation

## 2023-03-07 DIAGNOSIS — H2511 Age-related nuclear cataract, right eye: Secondary | ICD-10-CM | POA: Diagnosis not present

## 2023-03-07 DIAGNOSIS — I10 Essential (primary) hypertension: Secondary | ICD-10-CM | POA: Insufficient documentation

## 2023-03-07 DIAGNOSIS — Z87891 Personal history of nicotine dependence: Secondary | ICD-10-CM | POA: Diagnosis not present

## 2023-03-07 DIAGNOSIS — D649 Anemia, unspecified: Secondary | ICD-10-CM | POA: Insufficient documentation

## 2023-03-07 DIAGNOSIS — K2270 Barrett's esophagus without dysplasia: Secondary | ICD-10-CM | POA: Diagnosis not present

## 2023-03-07 HISTORY — DX: Dizziness and giddiness: R42

## 2023-03-07 HISTORY — DX: Presence of dental prosthetic device (complete) (partial): Z97.2

## 2023-03-07 HISTORY — PX: CATARACT EXTRACTION W/PHACO: SHX586

## 2023-03-07 SURGERY — PHACOEMULSIFICATION, CATARACT, WITH IOL INSERTION
Anesthesia: Monitor Anesthesia Care | Site: Eye | Laterality: Right

## 2023-03-07 MED ORDER — SIGHTPATH DOSE#1 BSS IO SOLN
INTRAOCULAR | Status: DC | PRN
  Administered 2023-03-07: 15 mL

## 2023-03-07 MED ORDER — MIDAZOLAM HCL 2 MG/2ML IJ SOLN
INTRAMUSCULAR | Status: DC | PRN
  Administered 2023-03-07: 2 mg via INTRAVENOUS

## 2023-03-07 MED ORDER — SIGHTPATH DOSE#1 NA HYALUR & NA CHOND-NA HYALUR IO KIT
PACK | INTRAOCULAR | Status: DC | PRN
  Administered 2023-03-07: 1 via OPHTHALMIC

## 2023-03-07 MED ORDER — TETRACAINE HCL 0.5 % OP SOLN
1.0000 [drp] | OPHTHALMIC | Status: DC | PRN
  Administered 2023-03-07 (×2): 1 [drp] via OPHTHALMIC

## 2023-03-07 MED ORDER — FENTANYL CITRATE (PF) 100 MCG/2ML IJ SOLN
INTRAMUSCULAR | Status: DC | PRN
  Administered 2023-03-07: 50 ug via INTRAVENOUS

## 2023-03-07 MED ORDER — MOXIFLOXACIN HCL 0.5 % OP SOLN
OPHTHALMIC | Status: DC | PRN
  Administered 2023-03-07: .2 mL via OPHTHALMIC

## 2023-03-07 MED ORDER — SIGHTPATH DOSE#1 BSS IO SOLN
INTRAOCULAR | Status: DC | PRN
  Administered 2023-03-07: 66 mL via OPHTHALMIC

## 2023-03-07 MED ORDER — BRIMONIDINE TARTRATE-TIMOLOL 0.2-0.5 % OP SOLN
OPHTHALMIC | Status: DC | PRN
  Administered 2023-03-07: 1 [drp] via OPHTHALMIC

## 2023-03-07 MED ORDER — ARMC OPHTHALMIC DILATING DROPS
1.0000 | OPHTHALMIC | Status: DC | PRN
  Administered 2023-03-07 (×3): 1 via OPHTHALMIC

## 2023-03-07 MED ORDER — SIGHTPATH DOSE#1 BSS IO SOLN
INTRAOCULAR | Status: DC | PRN
  Administered 2023-03-07: 1 mL

## 2023-03-07 SURGICAL SUPPLY — 20 items
CANNULA ANT/CHMB 27G (MISCELLANEOUS) IMPLANT
CANNULA ANT/CHMB 27GA (MISCELLANEOUS) IMPLANT
CATARACT SUITE SIGHTPATH (MISCELLANEOUS) ×1 IMPLANT
FEE CATARACT SUITE SIGHTPATH (MISCELLANEOUS) ×1 IMPLANT
GLOVE SRG 8 PF TXTR STRL LF DI (GLOVE) ×1 IMPLANT
GLOVE SURG ENC TEXT LTX SZ7.5 (GLOVE) ×1 IMPLANT
GLOVE SURG GAMMEX PI TX LF 7.5 (GLOVE) IMPLANT
GLOVE SURG UNDER POLY LF SZ8 (GLOVE) ×1
LENS IOL CLRN VT YLW 23.5 IMPLANT
LENS IOL VIVITY 23.5 ×1 IMPLANT
NDL FILTER BLUNT 18X1 1/2 (NEEDLE) ×1 IMPLANT
NDL RETROBULBAR .5 NSTRL (NEEDLE) IMPLANT
NEEDLE FILTER BLUNT 18X1 1/2 (NEEDLE) ×1 IMPLANT
PACK VIT ANT 23G (MISCELLANEOUS) IMPLANT
RING MALYGIN 7.0 (MISCELLANEOUS) IMPLANT
SUT ETHILON 10-0 CS-B-6CS-B-6 (SUTURE)
SUT VICRYL  9 0 (SUTURE)
SUT VICRYL 9 0 (SUTURE) IMPLANT
SUTURE EHLN 10-0 CS-B-6CS-B-6 (SUTURE) IMPLANT
SYR 3ML LL SCALE MARK (SYRINGE) ×1 IMPLANT

## 2023-03-07 NOTE — Transfer of Care (Signed)
Immediate Anesthesia Transfer of Care Note  Patient: Collin Mills  Procedure(s) Performed: CATARACT EXTRACTION PHACO AND INTRAOCULAR LENS PLACEMENT (IOC) RIGHT clareon vivity toric (Right: Eye)  Patient Location: PACU  Anesthesia Type: MAC  Level of Consciousness: awake, alert  and patient cooperative  Airway and Oxygen Therapy: Patient Spontanous Breathing and Patient connected to supplemental oxygen  Post-op Assessment: Post-op Vital signs reviewed, Patient's Cardiovascular Status Stable, Respiratory Function Stable, Patent Airway and No signs of Nausea or vomiting  Post-op Vital Signs: Reviewed and stable  Complications: No notable events documented.

## 2023-03-07 NOTE — Discharge Instructions (Signed)

## 2023-03-07 NOTE — Op Note (Signed)
LOCATION:  [facility]   PREOPERATIVE DIAGNOSIS:    Nuclear sclerotic cataract right eye. H25.11   POSTOPERATIVE DIAGNOSIS:  Nuclear sclerotic cataract right eye.     PROCEDURE:  Phacoemusification with posterior chamber intraocular lens placement of the right eye   ULTRASOUND TIME: Procedure(s) with comments: CATARACT EXTRACTION PHACO AND INTRAOCULAR LENS PLACEMENT (IOC) RIGHT clareon vivity toric (Right) - 8.59 0:55.8  LENS:   Implant Name Type Inv. Item Serial No. Manufacturer Lot No. LRB No. Used Action  CLAREON VIVITY IOL 23.5 d Intraocular Lens  [id number]   Right 1 Implanted    [id number] 23.5 D     SURGEON:  Deirdre Evener, MD   ANESTHESIA:  Topical with tetracaine drops and 2% Xylocaine jelly, augmented with 1% preservative-free intracameral lidocaine.    COMPLICATIONS:  None.   DESCRIPTION OF PROCEDURE:  The patient was identified in the holding room and transported to the operating room and placed in the supine position under the operating microscope.  The right eye was identified as the operative eye and it was prepped and draped in the usual sterile ophthalmic fashion.   A 1 millimeter clear-corneal paracentesis was made at the 12:00 position.  0.5 ml of preservative-free 1% lidocaine was injected into the anterior chamber. The anterior chamber was filled with Viscoat viscoelastic.  A 2.4 millimeter keratome was used to make a near-clear corneal incision at the 9:00 position.  A curvilinear capsulorrhexis was made with a cystotome and capsulorrhexis forceps.  Balanced salt solution was used to hydrodissect and hydrodelineate the nucleus.   Phacoemulsification was then used in stop and chop fashion to remove the lens nucleus and epinucleus.  The remaining cortex was then removed using the irrigation and aspiration handpiece. Provisc was then placed into the capsular bag to distend it for lens placement.  A lens was then injected into the capsular bag.  The  remaining viscoelastic was aspirated.   Wounds were hydrated with balanced salt solution.  The anterior chamber was inflated to a physiologic pressure with balanced salt solution.  No wound leaks were noted. Vigamox 0.2 ml of a 1mg  per ml solution was injected into the anterior chamber for a dose of 0.2 mg of intracameral antibiotic at the completion of the case.   Timolol and Brimonidine drops were applied to the eye.  The patient was taken to the recovery room in stable condition without complications of anesthesia or surgery.   Collin Mills 03/07/2023, 9:41 AM

## 2023-03-07 NOTE — H&P (Signed)
[facility]   Primary Care Physician:  Glori Luis, MD Ophthalmologist: Dr. Lockie Mola  Pre-Procedure History & Physical: HPI:  Collin Mills is a 67 y.o. male here for ophthalmic surgery.   Past Medical History:  Diagnosis Date   Allergic rhinitis    Allergy    Anemia    giving blood every six weeks   Barrett's esophagus    Cataract    forming   Chickenpox    Colon polyps    GERD (gastroesophageal reflux disease)    Hyperlipidemia    Hypertension    Iron deficiency anemia 11/11/2018   Migraines    past hx in the 90's   Osteoarthritis    osteoarthritis   Vertigo    1 episode.  aprrox 2019   Wears dentures    partial upper and lower    Past Surgical History:  Procedure Laterality Date   COLONOSCOPY     ESOPHAGOGASTRODUODENOSCOPY ENDOSCOPY     POLYPECTOMY     SHOULDER SURGERY Right 12/09/2021   right shoulder and bicep surgery   UPPER GASTROINTESTINAL ENDOSCOPY      Prior to Admission medications   Medication Sig Start Date End Date Taking? Authorizing Provider  atorvastatin (LIPITOR) 80 MG tablet TAKE 1 TABLET(80 MG) BY MOUTH DAILY 06/25/22  Yes Worthy Rancher B, FNP  celecoxib (CELEBREX) 200 MG capsule Take 200 mg by mouth daily. 02/07/21  Yes [provider]  fluticasone (FLONASE) 50 MCG/ACT nasal spray Place 2 sprays into both nostrils daily. 07/06/21  Yes Glori Luis, MD  hydrocortisone (ANUSOL-HC) 25 MG suppository Place 1 suppository (25 mg total) rectally at bedtime. 01/13/20  Yes Nandigam, Eleonore Chiquito, MD  hydrocortisone 2.5 % lotion Apply topically daily as needed. 08/09/21  Yes [provider]  omeprazole (PRILOSEC) 40 MG capsule TAKE 1 CAPSULE(40 MG) BY MOUTH DAILY 06/25/22  Yes Worthy Rancher B, FNP  tadalafil (CIALIS) 20 MG tablet Take 20 mg by mouth daily as needed. 11/14/22  Yes [provider]  telmisartan (MICARDIS) 80 MG tablet TAKE 1 TABLET(80 MG) BY MOUTH DAILY 02/08/23  Yes Glori Luis,  MD  azelastine (ASTELIN) 0.1 % nasal spray Place 2 sprays into both nostrils 2 (two) times daily. Use in each nostril as directed Patient not taking: Reported on 03/05/2023 07/06/21   Glori Luis, MD  glucose blood test strip Check once daily, diagnosis code E11.9, dispense based on patient and insurance preferences 01/31/21   Glori Luis, MD  hydrochlorothiazide (HYDRODIURIL) 25 MG tablet TAKE 1 TABLET(25 MG) BY MOUTH DAILY 03/05/23   Glori Luis, MD  ketoconazole (NIZORAL) 2 % cream Apply topically. 08/09/21   [provider]    Allergies as of 02/21/2023 - Review Complete 01/01/2023  Allergen Reaction Noted   Molds & smuts  07/15/2018    Family History  Problem Relation Age of Onset   Lung cancer Mother    COPD Mother    Lung cancer Father    Stroke Father    Spina bifida Sister    Cirrhosis Brother    Colon cancer Neg Hx    Esophageal cancer Neg Hx    Rectal cancer Neg Hx    Stomach cancer Neg Hx    Colon polyps Neg Hx     Social History   Socioeconomic History   Marital status: Married    Spouse name: Not on file   Number of children: 1   Years of education: Not on file  Highest education level: Not on file  Occupational History   Not on file  Tobacco Use   Smoking status: Former    Packs/day: 2.00    Years: 36.00    Additional pack years: 0.00    Total pack years: 72.00    Types: Cigarettes    Quit date: 2004    Years since quitting: 20.3   Smokeless tobacco: Never  Vaping Use   Vaping Use: Never used  Substance and Sexual Activity   Alcohol use: Yes    Alcohol/week: 5.0 standard drinks of alcohol    Types: 5 Shots of liquor per week    Comment: occasional   Drug use: No   Sexual activity: Not on file  Other Topics Concern   Not on file  Social History Narrative   Not on file   Social Determinants of Health   Financial Resource Strain: Not on file  Food Insecurity: Not on file  Transportation Needs: Not on file   Physical Activity: Not on file  Stress: Not on file  Social Connections: Not on file  Intimate Partner Violence: Not on file    Review of Systems: See HPI, otherwise negative ROS  Physical Exam: BP 136/85   Pulse 69   Temp 98.1 F (36.7 C) (Temporal)   Resp 13   Ht 5' 7.99" (1.727 m)   Wt 89.5 kg   BMI 30.02 kg/m  General:   Alert,  pleasant and cooperative in NAD Head:  Normocephalic and atraumatic. Lungs:  Clear to auscultation.    Heart:  Regular rate and rhythm.   Impression/Plan: Collin Mills is here for ophthalmic surgery.  Risks, benefits, limitations, and alternatives regarding ophthalmic surgery have been reviewed with the patient.  Questions have been answered.  All parties agreeable.   Lockie Mola, MD  03/07/2023, 8:33 AM

## 2023-03-07 NOTE — Anesthesia Postprocedure Evaluation (Signed)
Anesthesia Post Note  Patient: MELDON JAHODA  Procedure(s) Performed: CATARACT EXTRACTION PHACO AND INTRAOCULAR LENS PLACEMENT (IOC) RIGHT clareon vivity toric (Right: Eye)  Patient location during evaluation: PACU Anesthesia Type: MAC Level of consciousness: awake and alert Pain management: pain level controlled Vital Signs Assessment: post-procedure vital signs reviewed and stable Respiratory status: spontaneous breathing, nonlabored ventilation, respiratory function stable and patient connected to nasal cannula oxygen Cardiovascular status: stable and blood pressure returned to baseline Postop Assessment: no apparent nausea or vomiting Anesthetic complications: no   No notable events documented.   Last Vitals:  Vitals:   03/07/23 0940 03/07/23 0946  BP: (!) 144/82 134/84  Pulse: 73 65  Resp: 18 11  Temp: (!) 36.4 C (!) 36.3 C  SpO2:  94%    Last Pain:  Vitals:   03/07/23 0946  TempSrc:   PainSc: 0-No pain                 Makail Watling C Evren Shankland

## 2023-03-08 ENCOUNTER — Encounter: Payer: Self-pay | Admitting: Ophthalmology

## 2023-03-09 ENCOUNTER — Encounter: Payer: Self-pay | Admitting: Ophthalmology

## 2023-03-23 DIAGNOSIS — Z79890 Hormone replacement therapy (postmenopausal): Secondary | ICD-10-CM | POA: Diagnosis not present

## 2023-03-23 DIAGNOSIS — E291 Testicular hypofunction: Secondary | ICD-10-CM | POA: Diagnosis not present

## 2023-04-02 NOTE — Discharge Instructions (Signed)

## 2023-04-04 ENCOUNTER — Encounter: Payer: Self-pay | Admitting: Ophthalmology

## 2023-04-04 ENCOUNTER — Encounter
Admission: RE | Disposition: A | Discharge status: Home / Self Care | Payer: Self-pay | Source: Home / Self Care | Attending: Ophthalmology

## 2023-04-04 ENCOUNTER — Ambulatory Visit: Payer: No Typology Code available for payment source | Admitting: Anesthesiology

## 2023-04-04 ENCOUNTER — Ambulatory Visit
Admission: RE | Admit: 2023-04-04 | Discharge: 2023-04-04 | Disposition: A | Discharge status: Home / Self Care | Payer: No Typology Code available for payment source | Attending: Ophthalmology | Admitting: Ophthalmology

## 2023-04-04 ENCOUNTER — Other Ambulatory Visit: Payer: Self-pay

## 2023-04-04 DIAGNOSIS — K2270 Barrett's esophagus without dysplasia: Secondary | ICD-10-CM | POA: Insufficient documentation

## 2023-04-04 DIAGNOSIS — G479 Sleep disorder, unspecified: Secondary | ICD-10-CM | POA: Diagnosis not present

## 2023-04-04 DIAGNOSIS — H2512 Age-related nuclear cataract, left eye: Secondary | ICD-10-CM | POA: Insufficient documentation

## 2023-04-04 DIAGNOSIS — M1990 Unspecified osteoarthritis, unspecified site: Secondary | ICD-10-CM | POA: Diagnosis not present

## 2023-04-04 DIAGNOSIS — E291 Testicular hypofunction: Secondary | ICD-10-CM | POA: Diagnosis not present

## 2023-04-04 DIAGNOSIS — Z87891 Personal history of nicotine dependence: Secondary | ICD-10-CM | POA: Insufficient documentation

## 2023-04-04 DIAGNOSIS — I10 Essential (primary) hypertension: Secondary | ICD-10-CM | POA: Insufficient documentation

## 2023-04-04 DIAGNOSIS — K219 Gastro-esophageal reflux disease without esophagitis: Secondary | ICD-10-CM | POA: Diagnosis not present

## 2023-04-04 DIAGNOSIS — N529 Male erectile dysfunction, unspecified: Secondary | ICD-10-CM | POA: Diagnosis not present

## 2023-04-04 DIAGNOSIS — Z79890 Hormone replacement therapy (postmenopausal): Secondary | ICD-10-CM | POA: Diagnosis not present

## 2023-04-04 DIAGNOSIS — R6882 Decreased libido: Secondary | ICD-10-CM | POA: Diagnosis not present

## 2023-04-04 DIAGNOSIS — M2550 Pain in unspecified joint: Secondary | ICD-10-CM | POA: Diagnosis not present

## 2023-04-04 DIAGNOSIS — Z6830 Body mass index (BMI) 30.0-30.9, adult: Secondary | ICD-10-CM | POA: Diagnosis not present

## 2023-04-04 DIAGNOSIS — H269 Unspecified cataract: Secondary | ICD-10-CM | POA: Diagnosis not present

## 2023-04-04 HISTORY — PX: CATARACT EXTRACTION W/PHACO: SHX586

## 2023-04-04 SURGERY — PHACOEMULSIFICATION, CATARACT, WITH IOL INSERTION
Anesthesia: Monitor Anesthesia Care | Laterality: Left

## 2023-04-04 MED ORDER — MIDAZOLAM HCL 2 MG/2ML IJ SOLN
INTRAMUSCULAR | Status: DC | PRN
  Administered 2023-04-04: 2 mg via INTRAVENOUS

## 2023-04-04 MED ORDER — SIGHTPATH DOSE#1 BSS IO SOLN
INTRAOCULAR | Status: DC | PRN
  Administered 2023-04-04: 15 mL via INTRAOCULAR

## 2023-04-04 MED ORDER — FENTANYL CITRATE (PF) 100 MCG/2ML IJ SOLN
INTRAMUSCULAR | Status: DC | PRN
  Administered 2023-04-04: 50 ug via INTRAVENOUS

## 2023-04-04 MED ORDER — LACTATED RINGERS IV SOLN: INTRAVENOUS | Status: DC

## 2023-04-04 MED ORDER — SIGHTPATH DOSE#1 BSS IO SOLN
INTRAOCULAR | Status: DC | PRN
  Administered 2023-04-04: 51 mL via OPHTHALMIC

## 2023-04-04 MED ORDER — SIGHTPATH DOSE#1 NA HYALUR & NA CHOND-NA HYALUR IO KIT
PACK | INTRAOCULAR | Status: DC | PRN
  Administered 2023-04-04: 1 via OPHTHALMIC

## 2023-04-04 MED ORDER — MOXIFLOXACIN HCL 0.5 % OP SOLN
OPHTHALMIC | Status: DC | PRN
  Administered 2023-04-04: .2 mL via OPHTHALMIC

## 2023-04-04 MED ORDER — ARMC OPHTHALMIC DILATING DROPS
1.0000 | OPHTHALMIC | Status: DC | PRN
  Administered 2023-04-04 (×3): 1 via OPHTHALMIC

## 2023-04-04 MED ORDER — CEFUROXIME OPHTHALMIC INJECTION 1 MG/0.1 ML
INJECTION | OPHTHALMIC | Status: DC | PRN
  Administered 2023-04-04: 1 mg via INTRACAMERAL

## 2023-04-04 MED ORDER — TETRACAINE HCL 0.5 % OP SOLN
1.0000 [drp] | OPHTHALMIC | Status: DC | PRN
  Administered 2023-04-04 (×3): 1 [drp] via OPHTHALMIC

## 2023-04-04 MED ORDER — SIGHTPATH DOSE#1 BSS IO SOLN
INTRAOCULAR | Status: DC | PRN
  Administered 2023-04-04: 2 mL

## 2023-04-04 SURGICAL SUPPLY — 10 items
CATARACT SUITE SIGHTPATH (MISCELLANEOUS) ×1 IMPLANT
FEE CATARACT SUITE SIGHTPATH (MISCELLANEOUS) ×1 IMPLANT
GLOVE SRG 8 PF TXTR STRL LF DI (GLOVE) ×1 IMPLANT
GLOVE SURG ENC TEXT LTX SZ7.5 (GLOVE) ×1 IMPLANT
GLOVE SURG UNDER POLY LF SZ8 (GLOVE) ×1
LENS CLAREON VIVITY IOL 24.0 ×1 IMPLANT
LENS IOL CLRN VT YLW 24.0 IMPLANT
NDL FILTER BLUNT 18X1 1/2 (NEEDLE) ×1 IMPLANT
NEEDLE FILTER BLUNT 18X1 1/2 (NEEDLE) ×1 IMPLANT
SYR 3ML LL SCALE MARK (SYRINGE) ×1 IMPLANT

## 2023-04-04 NOTE — Anesthesia Preprocedure Evaluation (Addendum)
Anesthesia Evaluation  Patient identified by MRN, date of birth, ID band Patient awake    Reviewed: Allergy & Precautions, H&P , NPO status , Patient's Chart, lab work & pertinent test results  Airway Mallampati: II  TM Distance: >3 FB Neck ROM: Full    Dental no notable dental hx. (+) Upper Dentures, Lower Dentures   Pulmonary former smoker Used to smoke 2 to 3 PPD but quit 20 years ago and denies any sequelae now, good exercise tolerance   Pulmonary exam normal breath sounds clear to auscultation       Cardiovascular hypertension, Normal cardiovascular exam Rhythm:Regular Rate:Normal     Neuro/Psych  Headaches    GI/Hepatic Neg liver ROS,GERD  ,,,Elevated LFTs Barretts esophagus controlled w/Rx. Took med today    Endo/Other  negative endocrine ROS    Renal/GU negative Renal ROS  negative genitourinary   Musculoskeletal  (+) Arthritis ,    Abdominal   Peds negative pediatric ROS (+)  Hematology  (+) Blood dyscrasia, anemia   Anesthesia Other Findings GERD (gastroesophageal reflux disease) Allergic rhinitis Hypertension Hyperlipidemia  Migraines Barrett's esophagus  Colon polyps Allergy Anemia Osteoarthritis   Previous cataract surgery here 03-07-23 Iron deficiency anemia Cataract  Wears dentures Vertigo     Reproductive/Obstetrics negative OB ROS                             Anesthesia Physical Anesthesia Plan  ASA: 2  Anesthesia Plan: MAC   Post-op Pain Management:    Induction: Intravenous  PONV Risk Score and Plan:   Airway Management Planned: Natural Airway and Nasal Cannula  Additional Equipment:   Intra-op Plan:   Post-operative Plan:   Informed Consent: I have reviewed the patients History and Physical, chart, labs and discussed the procedure including the risks, benefits and alternatives for the proposed anesthesia with the patient or authorized  representative who has indicated his/her understanding and acceptance.     Dental Advisory Given  Plan Discussed with: Anesthesiologist, CRNA and Surgeon  Anesthesia Plan Comments: (Patient consented for risks of anesthesia including but not limited to:  - adverse reactions to medications - damage to eyes, teeth, lips or other oral mucosa - nerve damage due to positioning  - sore throat or hoarseness - Damage to heart, brain, nerves, lungs, other parts of body or loss of life  Patient voiced understanding.)        Anesthesia Quick Evaluation

## 2023-04-04 NOTE — Transfer of Care (Signed)
Immediate Anesthesia Transfer of Care Note  Patient: Collin Mills  Procedure(s) Performed: CATARACT EXTRACTION PHACO AND INTRAOCULAR LENS PLACEMENT (IOC) LEFT clareon vivity toric 5.39 00:29.9 (Left)  Patient Location: PACU  Anesthesia Type: MAC  Level of Consciousness: awake, alert  and patient cooperative  Airway and Oxygen Therapy: Patient Spontanous Breathing and Patient connected to supplemental oxygen  Post-op Assessment: Post-op Vital signs reviewed, Patient's Cardiovascular Status Stable, Respiratory Function Stable, Patent Airway and No signs of Nausea or vomiting  Post-op Vital Signs: Reviewed and stable  Complications: No notable events documented.

## 2023-04-04 NOTE — Anesthesia Postprocedure Evaluation (Signed)
Anesthesia Post Note  Patient: Collin Mills  Procedure(s) Performed: CATARACT EXTRACTION PHACO AND INTRAOCULAR LENS PLACEMENT (IOC) LEFT clareon vivity toric 5.39 00:29.9 (Left)  Patient location during evaluation: PACU Anesthesia Type: MAC Level of consciousness: awake and alert Pain management: pain level controlled Vital Signs Assessment: post-procedure vital signs reviewed and stable Respiratory status: spontaneous breathing, nonlabored ventilation, respiratory function stable and patient connected to nasal cannula oxygen Cardiovascular status: stable and blood pressure returned to baseline Postop Assessment: no apparent nausea or vomiting Anesthetic complications: no   No notable events documented.   Last Vitals:  Vitals:   04/04/23 0939 04/04/23 0944  BP: (!) 143/76 (!) 147/75  Pulse: 74 69  Resp: 15 18  Temp: (!) 36.1 C (!) 36.1 C  SpO2: 97% 95%    Last Pain:  Vitals:   04/04/23 0944  TempSrc:   PainSc: 0-No pain                 Marisue Humble

## 2023-04-04 NOTE — H&P (Signed)
[facility]   Primary Care Physician:  Glori Luis, MD Ophthalmologist: Dr. Lockie Mola  Pre-Procedure History & Physical: HPI:  Collin Mills is a 67 y.o. male here for ophthalmic surgery.   Past Medical History:  Diagnosis Date   Allergic rhinitis    Allergy    Anemia    giving blood every six weeks   Barrett's esophagus    Cataract    forming   Chickenpox    Colon polyps    GERD (gastroesophageal reflux disease)    Hyperlipidemia    Hypertension    Iron deficiency anemia 11/11/2018   Migraines    past hx in the 90's   Osteoarthritis    osteoarthritis   Vertigo    1 episode.  aprrox 2019   Wears dentures    partial upper and lower    Past Surgical History:  Procedure Laterality Date   CATARACT EXTRACTION W/PHACO Right 03/07/2023   Procedure: CATARACT EXTRACTION PHACO AND INTRAOCULAR LENS PLACEMENT (IOC) RIGHT clareon vivity toric;  Surgeon: Lockie Mola, MD;  Location: [facility];  Service: Ophthalmology;  Laterality: Right;  8.59 0:55.8   COLONOSCOPY     ESOPHAGOGASTRODUODENOSCOPY ENDOSCOPY     POLYPECTOMY     SHOULDER SURGERY Right 12/09/2021   right shoulder and bicep surgery   UPPER GASTROINTESTINAL ENDOSCOPY      Prior to Admission medications   Medication Sig Start Date End Date Taking? Authorizing Provider  atorvastatin (LIPITOR) 80 MG tablet TAKE 1 TABLET(80 MG) BY MOUTH DAILY 06/25/22  Yes Worthy Rancher B, FNP  celecoxib (CELEBREX) 200 MG capsule Take 200 mg by mouth daily. 02/07/21  Yes [provider]  fluticasone (FLONASE) 50 MCG/ACT nasal spray Place 2 sprays into both nostrils daily. 07/06/21  Yes Glori Luis, MD  hydrochlorothiazide (HYDRODIURIL) 25 MG tablet TAKE 1 TABLET(25 MG) BY MOUTH DAILY 03/05/23  Yes Glori Luis, MD  hydrocortisone (ANUSOL-HC) 25 MG suppository Place 1 suppository (25 mg total) rectally at bedtime. 01/13/20  Yes Nandigam, Eleonore Chiquito, MD  hydrocortisone 2.5 %  lotion Apply topically daily as needed. 08/09/21  Yes [provider]  ketoconazole (NIZORAL) 2 % cream Apply topically. 08/09/21  Yes [provider]  omeprazole (PRILOSEC) 40 MG capsule TAKE 1 CAPSULE(40 MG) BY MOUTH DAILY 06/25/22  Yes Worthy Rancher B, FNP  tadalafil (CIALIS) 20 MG tablet Take 20 mg by mouth daily as needed. 11/14/22  Yes [provider]  telmisartan (MICARDIS) 80 MG tablet TAKE 1 TABLET(80 MG) BY MOUTH DAILY 02/08/23  Yes Glori Luis, MD  azelastine (ASTELIN) 0.1 % nasal spray Place 2 sprays into both nostrils 2 (two) times daily. Use in each nostril as directed Patient not taking: Reported on 03/05/2023 07/06/21   Glori Luis, MD  glucose blood test strip Check once daily, diagnosis code E11.9, dispense based on patient and insurance preferences 01/31/21   Glori Luis, MD    Allergies as of 02/21/2023 - Review Complete 01/01/2023  Allergen Reaction Noted   Molds & smuts  07/15/2018    Family History  Problem Relation Age of Onset   Lung cancer Mother    COPD Mother    Lung cancer Father    Stroke Father    Spina bifida Sister    Cirrhosis Brother    Colon cancer Neg Hx    Esophageal cancer Neg Hx    Rectal cancer Neg Hx    Stomach cancer Neg Hx  Colon polyps Neg Hx     Social History   Socioeconomic History   Marital status: Married    Spouse name: Not on file   Number of children: 1   Years of education: Not on file   Highest education level: Not on file  Occupational History   Not on file  Tobacco Use   Smoking status: Former    Packs/day: 2.00    Years: 36.00    Additional pack years: 0.00    Total pack years: 72.00    Types: Cigarettes    Quit date: 2004    Years since quitting: 20.4   Smokeless tobacco: Never  Vaping Use   Vaping Use: Never used  Substance and Sexual Activity   Alcohol use: Yes    Alcohol/week: 5.0 standard drinks of alcohol    Types: 5 Shots of liquor per week    Comment:  occasional   Drug use: No   Sexual activity: Not on file  Other Topics Concern   Not on file  Social History Narrative   Not on file   Social Determinants of Health   Financial Resource Strain: Not on file  Food Insecurity: Not on file  Transportation Needs: Not on file  Physical Activity: Not on file  Stress: Not on file  Social Connections: Not on file  Intimate Partner Violence: Not on file    Review of Systems: See HPI, otherwise negative ROS  Physical Exam: Ht 5' 7.99" (1.727 m)   Wt 89.5 kg   BMI 30.01 kg/m  General:   Alert,  pleasant and cooperative in NAD Head:  Normocephalic and atraumatic. Lungs:  Clear to auscultation.    Heart:  Regular rate and rhythm.   Impression/Plan: Collin Mills is here for ophthalmic surgery.  Risks, benefits, limitations, and alternatives regarding ophthalmic surgery have been reviewed with the patient.  Questions have been answered.  All parties agreeable.   Lockie Mola, MD  04/04/2023, 8:26 AM

## 2023-04-04 NOTE — Op Note (Signed)
OPERATIVE NOTE  SALVATORE SHEAR [id number] 04/04/2023   PREOPERATIVE DIAGNOSIS:  Nuclear sclerotic cataract left eye. H25.12   POSTOPERATIVE DIAGNOSIS:    Nuclear sclerotic cataract left eye.     PROCEDURE:  Phacoemusification with posterior chamber intraocular lens placement of the left eye  Ultrasound time: Procedure(s): CATARACT EXTRACTION PHACO AND INTRAOCULAR LENS PLACEMENT (IOC) LEFT clareon vivity toric 5.39 00:29.9 (Left)  LENS:   Implant Name Type Inv. Item Serial No. Manufacturer Lot No. LRB No. Used Action  LENS CLAREON VIVITY IOL 24.0 - [id number]  LENS CLAREON VIVITY IOL 24.0 [id number] [employer]  Left 1 Implanted     CNWET0 24.0   SURGEON:  Deirdre Evener, MD   ANESTHESIA:  Topical with tetracaine drops and 2% Xylocaine jelly, augmented with 1% preservative-free intracameral lidocaine.    COMPLICATIONS:  None.   DESCRIPTION OF PROCEDURE:  The patient was identified in the holding room and transported to the operating room and placed in the supine position under the operating microscope.  The left eye was identified as the operative eye and it was prepped and draped in the usual sterile ophthalmic fashion.   A 1 millimeter clear-corneal paracentesis was made at the 1:30 position.  0.5 ml of preservative-free 1% lidocaine was injected into the anterior chamber.  The anterior chamber was filled with Viscoat viscoelastic.  A 2.4 millimeter keratome was used to make a near-clear corneal incision at the 10:30 position.  .  A curvilinear capsulorrhexis was made with a cystotome and capsulorrhexis forceps.  Balanced salt solution was used to hydrodissect and hydrodelineate the nucleus.   Phacoemulsification was then used in stop and chop fashion to remove the lens nucleus and epinucleus.  The remaining cortex was then removed using the irrigation and aspiration handpiece. Provisc was then placed into the capsular bag to distend it for lens placement.  A lens was  then injected into the capsular bag.  The remaining viscoelastic was aspirated.   Wounds were hydrated with balanced salt solution.  The anterior chamber was inflated to a physiologic pressure with balanced salt solution.  No wound leaks were noted. Vigamox 0.2 ml of a 1mg  per ml solution was injected into the anterior chamber for a dose of 0.2 mg of intracameral antibiotic at the completion of the case.   Timolol and Brimonidine drops were applied to the eye.  The patient was taken to the recovery room in stable condition without complications of anesthesia or surgery.  Altie Savard 04/04/2023, 9:39 AM

## 2023-04-05 ENCOUNTER — Encounter: Payer: Self-pay | Admitting: Ophthalmology

## 2023-05-17 ENCOUNTER — Other Ambulatory Visit: Payer: Self-pay | Admitting: Family Medicine

## 2023-05-17 DIAGNOSIS — E785 Hyperlipidemia, unspecified: Secondary | ICD-10-CM | POA: Diagnosis not present

## 2023-05-17 DIAGNOSIS — R7303 Prediabetes: Secondary | ICD-10-CM | POA: Diagnosis not present

## 2023-05-17 DIAGNOSIS — I10 Essential (primary) hypertension: Secondary | ICD-10-CM | POA: Diagnosis not present

## 2023-05-17 DIAGNOSIS — Z125 Encounter for screening for malignant neoplasm of prostate: Secondary | ICD-10-CM | POA: Diagnosis not present

## 2023-05-29 ENCOUNTER — Ambulatory Visit (INDEPENDENT_AMBULATORY_CARE_PROVIDER_SITE_OTHER): Payer: No Typology Code available for payment source | Admitting: Family Medicine

## 2023-05-29 ENCOUNTER — Encounter: Payer: Self-pay | Admitting: Family Medicine

## 2023-05-29 VITALS — BP 120/76 | HR 79 | Temp 97.7°F | Ht 67.99 in | Wt 199.8 lb

## 2023-05-29 DIAGNOSIS — H9193 Unspecified hearing loss, bilateral: Secondary | ICD-10-CM | POA: Diagnosis not present

## 2023-05-29 DIAGNOSIS — K219 Gastro-esophageal reflux disease without esophagitis: Secondary | ICD-10-CM | POA: Insufficient documentation

## 2023-05-29 DIAGNOSIS — Z0000 Encounter for general adult medical examination without abnormal findings: Secondary | ICD-10-CM

## 2023-05-29 DIAGNOSIS — H9190 Unspecified hearing loss, unspecified ear: Secondary | ICD-10-CM | POA: Insufficient documentation

## 2023-05-29 DIAGNOSIS — E785 Hyperlipidemia, unspecified: Secondary | ICD-10-CM

## 2023-05-29 DIAGNOSIS — R7303 Prediabetes: Secondary | ICD-10-CM | POA: Diagnosis not present

## 2023-05-29 MED ORDER — OMEPRAZOLE 20 MG PO CPDR
20.0000 mg | DELAYED_RELEASE_CAPSULE | Freq: Every day | ORAL | 3 refills | Status: DC
Start: 2023-05-29 — End: 2023-10-08

## 2023-05-29 MED ORDER — ROSUVASTATIN CALCIUM 20 MG PO TABS
20.0000 mg | ORAL_TABLET | Freq: Every day | ORAL | 3 refills | Status: AC
Start: 2023-05-29 — End: ?

## 2023-05-29 NOTE — Assessment & Plan Note (Signed)
Physical completed.  Encouraged healthy diet and exercise.  Cancer screening is up-to-date.  I encouraged him to get the flu vaccine and updated COVID-vaccine when they become available.  Advised not to drink more than 3 alcoholic beverages at a time and not to increase his alcohol intake.  Lab work reviewed.

## 2023-05-29 NOTE — Assessment & Plan Note (Signed)
Refer to audiology for evaluation.

## 2023-05-29 NOTE — Assessment & Plan Note (Signed)
Remains adequately controlled.  He will continue diet and exercise for this.

## 2023-05-29 NOTE — Patient Instructions (Signed)
If you do not hear from audiology in the next couple of weeks please let us know.

## 2023-05-29 NOTE — Assessment & Plan Note (Signed)
Adequately controlled.  Patient will reduce omeprazole to 20 mg daily.  If he has recurrence of reflux symptoms he will let us know.

## 2023-05-29 NOTE — Progress Notes (Signed)
Marikay Alar, MD Phone: [phone]  Collin Mills is a 67 y.o. male who presents today for CPE.  Diet: keto Exercise: weights 3x/week, walks 5x/week Colonoscopy: 01/11/22, 3 year recall Prostate cancer screening: UTD Family history-  Prostate cancer: no  Colon cancer: no Vaccines-   Flu: due  Tetanus: UTD  Shingles: UTD  COVID19: x5  Pneumonia: UTD Hep C Screening: UTD Tobacco use: no Alcohol use: 6/week Illicit Drug use: no Dentist: yes Ophthalmology: yes Patient reports concern about possible reduced hearing.  He is requesting referral for evaluation of this. Patient also request a reduction in dose of omeprazole to 20 mg daily.  Notes he is not having any reflux issues and wants to see if he is able to tolerate a smaller dose. LDL was quite elevated.  Patient did not resume Lipitor as advised.  He notes he had achiness while on the Lipitor.  Active Ambulatory Problems    Diagnosis Date Noted   Osteoarthritis 12/28/2015   Allergic rhinitis 12/28/2015   Essential hypertension 12/28/2015   Hyperlipidemia 12/28/2015   Obesity 08/02/2016   Dizziness 05/23/2017   Change in vision 05/23/2017   Cerebral aneurysm 06/13/2017   Routine general medical examination at a health care facility 01/21/2018   Prediabetes 07/15/2018   Iron deficiency anemia 11/11/2018   Hx of adenomatous colonic polyps 11/13/2018   Eosinophilia 04/08/2019   Elevated LFTs 06/16/2019   Right elbow pain 12/05/2019   Right shoulder pain 11/02/2020   Hypercalcemia 01/31/2021   Lateral epicondylitis of right elbow 03/04/2020   Pain in joint of right elbow 12/24/2019   Impingement syndrome of right shoulder region 05/03/2020   Visual disturbance 05/28/2017   Biceps tendinitis of right shoulder 05/12/2021   Decreased libido 08/17/2021   GERD (gastroesophageal reflux disease) 05/29/2023   Hearing difficulty 05/29/2023   Resolved Ambulatory Problems    Diagnosis Date Noted   Frequent  urination 12/28/2015   Bilateral lower extremity edema 04/03/2016   Abdominal fullness 04/03/2016   Cough 08/02/2016   Acute bronchitis 09/05/2016   Rotator cuff impingement syndrome of left shoulder 01/21/2018   Past Medical History:  Diagnosis Date   Allergy    Anemia    Barrett's esophagus    Cataract    Chickenpox    Colon polyps    Hypertension    Migraines    Vertigo    Wears dentures     Family History  Problem Relation Age of Onset   Lung cancer Mother    COPD Mother    Lung cancer Father    Stroke Father    Spina bifida Sister    Cirrhosis Brother    Colon cancer Neg Hx    Esophageal cancer Neg Hx    Rectal cancer Neg Hx    Stomach cancer Neg Hx    Colon polyps Neg Hx     Social History   Socioeconomic History   Marital status: Married    Spouse name: Not on file   Number of children: 1   Years of education: Not on file   Highest education level: Not on file  Occupational History   Not on file  Tobacco Use   Smoking status: Former    Current packs/day: 0.00    Average packs/day: 2.0 packs/day for 36.0 years (72.0 ttl pk-yrs)    Types: Cigarettes    Start date: 55    Quit date: 2004    Years since quitting: 20.6   Smokeless tobacco: Never  Vaping  Use   Vaping status: Never Used  Substance and Sexual Activity   Alcohol use: Yes    Alcohol/week: 5.0 standard drinks of alcohol    Types: 5 Shots of liquor per week    Comment: occasional   Drug use: No   Sexual activity: Not on file  Other Topics Concern   Not on file  Social History Narrative   Not on file   Social Determinants of Health   Financial Resource Strain: Not on file  Food Insecurity: Not on file  Transportation Needs: Not on file  Physical Activity: Not on file  Stress: Not on file  Social Connections: Not on file  Intimate Partner Violence: Not on file    ROS  General:  Negative for nexplained weight loss, fever Skin: Negative for new or changing mole, sore that  won't heal HEENT: Positive for trouble hearing, negative for trouble seeing, ringing in ears, mouth sores, hoarseness, change in voice, dysphagia. CV:  Negative for chest pain, dyspnea, edema, palpitations Resp: Negative for cough, dyspnea, hemoptysis GI: Negative for nausea, vomiting, diarrhea, constipation, abdominal pain, melena, hematochezia. GU: Negative for dysuria, incontinence, urinary hesitance, hematuria, vaginal or penile discharge, polyuria, sexual difficulty, lumps in testicle or breasts MSK: Positive for muscle cramps or aches, negative for joint pain or swelling Neuro: Negative for headaches, weakness, numbness, dizziness, passing out/fainting Psych: Negative for depression, anxiety, memory problems  Objective  Physical Exam Vitals:   05/29/23 0941  BP: 120/76  Pulse: 79  Temp: 97.7 F (36.5 C)  SpO2: 96%    BP Readings from Last 3 Encounters:  05/29/23 120/76  04/04/23 (!) 147/75  03/07/23 134/84   Wt Readings from Last 3 Encounters:  05/29/23 199 lb 12.8 oz (90.6 kg)  04/04/23 200 lb 3.2 oz (90.8 kg)  03/07/23 197 lb 6.4 oz (89.5 kg)    Physical Exam Constitutional:      General: He is not in acute distress.    Appearance: He is not diaphoretic.  HENT:     Head: Normocephalic and atraumatic.  Cardiovascular:     Rate and Rhythm: Normal rate and regular rhythm.     Heart sounds: Normal heart sounds.  Pulmonary:     Effort: Pulmonary effort is normal.     Breath sounds: Normal breath sounds.  Abdominal:     General: Bowel sounds are normal. There is no distension.     Tenderness: There is no abdominal tenderness.  Musculoskeletal:     Right lower leg: No edema.     Left lower leg: No edema.  Lymphadenopathy:     Cervical: No cervical adenopathy.  Skin:    General: Skin is warm and dry.  Neurological:     Mental Status: He is alert.  Psychiatric:        Mood and Affect: Mood normal.      Assessment/Plan:   Routine general medical  examination at a health care facility Assessment & Plan: Physical completed.  Encouraged healthy diet and exercise.  Cancer screening is up-to-date.  I encouraged him to get the flu vaccine and updated COVID-vaccine when they become available.  Advised not to drink more than 3 alcoholic beverages at a time and not to increase his alcohol intake.  Lab work reviewed.   Prediabetes Assessment & Plan: Remains adequately controlled.  He will continue diet and exercise for this.   Hyperlipidemia, unspecified hyperlipidemia type Assessment & Plan: LDL is above goal.  We will trial Crestor 20 mg daily and  recheck labs in 6 weeks.  If he has achiness with the Crestor he will let us know.  Discussed the potential use of PCSK9 inhibitors moving forward if he is unable to tolerate further statin use.  Orders: -     Rosuvastatin Calcium; Take 1 tablet (20 mg total) by mouth daily.  Dispense: 90 tablet; Refill: 3 -     Hepatic function panel; Future -     LDL cholesterol, direct; Future  Gastroesophageal reflux disease, unspecified whether esophagitis present Assessment & Plan: Adequately controlled.  Patient will reduce omeprazole to 20 mg daily.  If he has recurrence of reflux symptoms he will let us know.  Orders: -     Omeprazole; Take 1 capsule (20 mg total) by mouth daily.  Dispense: 30 capsule; Refill: 3  Hearing difficulty of both ears Assessment & Plan: Refer to audiology for evaluation.  Orders: -     Ambulatory referral to Audiology    Return in about 6 weeks (around 07/10/2023) for labs, 6 months PCP.   Marikay Alar, MD [facility]

## 2023-05-29 NOTE — Assessment & Plan Note (Signed)
LDL is above goal.  We will trial Crestor 20 mg daily and recheck labs in 6 weeks.  If he has achiness with the Crestor he will let us know.  Discussed the potential use of PCSK9 inhibitors moving forward if he is unable to tolerate further statin use.

## 2023-06-04 ENCOUNTER — Telehealth: Payer: Self-pay

## 2023-06-04 NOTE — Telephone Encounter (Signed)
Received notification that pt has an appointment at [facility].  Called to be sure pt was aware.  Spoke with pt and he was aware of appointment.  Thanked me for the call.

## 2023-06-06 ENCOUNTER — Ambulatory Visit (INDEPENDENT_AMBULATORY_CARE_PROVIDER_SITE_OTHER): Payer: No Typology Code available for payment source | Admitting: Emergency Medicine

## 2023-06-06 VITALS — Ht 68.0 in | Wt 193.0 lb

## 2023-06-06 DIAGNOSIS — Z0000 Encounter for general adult medical examination without abnormal findings: Secondary | ICD-10-CM

## 2023-06-06 NOTE — Patient Instructions (Addendum)
[name] , Thank you for taking time to come for your Medicare Wellness Visit. I appreciate your ongoing commitment to your health goals. Please review the following plan we discussed and let me know if I can assist you in the future.   Referrals/Orders/Follow-Ups/Clinician Recommendations: Get the flu shot this fall. Get Covid and RSV when available.  This is a list of the screening recommended for you and due dates:  Health Maintenance  Topic Date Due   Yearly kidney health urinalysis for diabetes  Never done   COVID-19 Vaccine (6 - 2023-24 season) 11/15/2022   Eye exam for diabetics  01/03/2023   Complete foot exam   05/27/2023   Flu Shot  01/21/2024*   Hemoglobin A1C  11/17/2023   Yearly kidney function blood test for diabetes  05/16/2024   Medicare Annual Wellness Visit  06/05/2024   Colon Cancer Screening  01/11/2025   DTaP/Tdap/Td vaccine (2 - Td or Tdap) 01/22/2028   Pneumonia Vaccine  Completed   Hepatitis C Screening  Completed   Zoster (Shingles) Vaccine  Completed   HPV Vaccine  Aged Out  *Topic was postponed. The date shown is not the original due date.    Advanced directives: (Copy Requested) Please bring a copy of your health care power of attorney and living will to the office to be added to your chart at your convenience.  Next Medicare Annual Wellness Visit scheduled for next year: Yes, 06/11/24 @ 10:30am  Preventive Care 65 Years and Older, Male  Preventive care refers to lifestyle choices and visits with your health care provider that can promote health and wellness. What does preventive care include? A yearly physical exam. This is also called an annual well check. Dental exams once or twice a year. Routine eye exams. Ask your health care provider how often you should have your eyes checked. Personal lifestyle choices, including: Daily care of your teeth and gums. Regular physical activity. Eating a healthy diet. Avoiding tobacco and drug use. Limiting  alcohol use. Practicing safe sex. Taking low doses of aspirin every day. Taking vitamin and mineral supplements as recommended by your health care provider. What happens during an annual well check? The services and screenings done by your health care provider during your annual well check will depend on your age, overall health, lifestyle risk factors, and family history of disease. Counseling  Your health care provider may ask you questions about your: Alcohol use. Tobacco use. Drug use. Emotional well-being. Home and relationship well-being. Sexual activity. Eating habits. History of falls. Memory and ability to understand (cognition). Work and work Astronomer. Screening  You may have the following tests or measurements: Height, weight, and BMI. Blood pressure. Lipid and cholesterol levels. These may be checked every 5 years, or more frequently if you are over 64 years old. Skin check. Lung cancer screening. You may have this screening every year starting at age 40 if you have a 30-pack-year history of smoking and currently smoke or have quit within the past 15 years. Fecal occult blood test (FOBT) of the stool. You may have this test every year starting at age 48. Flexible sigmoidoscopy or colonoscopy. You may have a sigmoidoscopy every 5 years or a colonoscopy every 10 years starting at age 1. Prostate cancer screening. Recommendations will vary depending on your family history and other risks. Hepatitis C blood test. Hepatitis B blood test. Sexually transmitted disease (STD) testing. Diabetes screening. This is done by checking your blood sugar (glucose) after you have  not eaten for a while (fasting). You may have this done every 1-3 years. Abdominal aortic aneurysm (AAA) screening. You may need this if you are a current or former smoker. Osteoporosis. You may be screened starting at age 22 if you are at high risk. Talk with your health care provider about your test results,  treatment options, and if necessary, the need for more tests. Vaccines  Your health care provider may recommend certain vaccines, such as: Influenza vaccine. This is recommended every year. Tetanus, diphtheria, and acellular pertussis (Tdap, Td) vaccine. You may need a Td booster every 10 years. Zoster vaccine. You may need this after age 72. Pneumococcal 13-valent conjugate (PCV13) vaccine. One dose is recommended after age 84. Pneumococcal polysaccharide (PPSV23) vaccine. One dose is recommended after age 62. Talk to your health care provider about which screenings and vaccines you need and how often you need them. This information is not intended to replace advice given to you by your health care provider. Make sure you discuss any questions you have with your health care provider. Document Released: 11/05/2015 Document Revised: 06/28/2016 Document Reviewed: 08/10/2015 Elsevier Interactive Patient Education  2017 [employer].  Fall Prevention in the Home Falls can cause injuries. They can happen to people of all ages. There are many things you can do to make your home safe and to help prevent falls. What can I do on the outside of my home? Regularly fix the edges of walkways and driveways and fix any cracks. Remove anything that might make you trip as you walk through a door, such as a raised step or threshold. Trim any bushes or trees on the path to your home. Use bright outdoor lighting. Clear any walking paths of anything that might make someone trip, such as rocks or tools. Regularly check to see if handrails are loose or broken. Make sure that both sides of any steps have handrails. Any raised decks and porches should have guardrails on the edges. Have any leaves, snow, or ice cleared regularly. Use sand or salt on walking paths during winter. Clean up any spills in your garage right away. This includes oil or grease spills. What can I do in the bathroom? Use night  lights. Install grab bars by the toilet and in the tub and shower. Do not use towel bars as grab bars. Use non-skid mats or decals in the tub or shower. If you need to sit down in the shower, use a plastic, non-slip stool. Keep the floor dry. Clean up any water that spills on the floor as soon as it happens. Remove soap buildup in the tub or shower regularly. Attach bath mats securely with double-sided non-slip rug tape. Do not have throw rugs and other things on the floor that can make you trip. What can I do in the bedroom? Use night lights. Make sure that you have a light by your bed that is easy to reach. Do not use any sheets or blankets that are too big for your bed. They should not hang down onto the floor. Have a firm chair that has side arms. You can use this for support while you get dressed. Do not have throw rugs and other things on the floor that can make you trip. What can I do in the kitchen? Clean up any spills right away. Avoid walking on wet floors. Keep items that you use a lot in easy-to-reach places. If you need to reach something above you, use a strong step stool  that has a grab bar. Keep electrical cords out of the way. Do not use floor polish or wax that makes floors slippery. If you must use wax, use non-skid floor wax. Do not have throw rugs and other things on the floor that can make you trip. What can I do with my stairs? Do not leave any items on the stairs. Make sure that there are handrails on both sides of the stairs and use them. Fix handrails that are broken or loose. Make sure that handrails are as long as the stairways. Check any carpeting to make sure that it is firmly attached to the stairs. Fix any carpet that is loose or worn. Avoid having throw rugs at the top or bottom of the stairs. If you do have throw rugs, attach them to the floor with carpet tape. Make sure that you have a light switch at the top of the stairs and the bottom of the stairs. If  you do not have them, ask someone to add them for you. What else can I do to help prevent falls? Wear shoes that: Do not have high heels. Have rubber bottoms. Are comfortable and fit you well. Are closed at the toe. Do not wear sandals. If you use a stepladder: Make sure that it is fully opened. Do not climb a closed stepladder. Make sure that both sides of the stepladder are locked into place. Ask someone to hold it for you, if possible. Clearly mark and make sure that you can see: Any grab bars or handrails. First and last steps. Where the edge of each step is. Use tools that help you move around (mobility aids) if they are needed. These include: Canes. Walkers. Scooters. Crutches. Turn on the lights when you go into a dark area. Replace any light bulbs as soon as they burn out. Set up your furniture so you have a clear path. Avoid moving your furniture around. If any of your floors are uneven, fix them. If there are any pets around you, be aware of where they are. Review your medicines with your doctor. Some medicines can make you feel dizzy. This can increase your chance of falling. Ask your doctor what other things that you can do to help prevent falls. This information is not intended to replace advice given to you by your health care provider. Make sure you discuss any questions you have with your health care provider. Document Released: 08/05/2009 Document Revised: 03/16/2016 Document Reviewed: 11/13/2014 Elsevier Interactive Patient Education  2017 [employer].

## 2023-06-06 NOTE — Progress Notes (Signed)
Subjective:   Collin Mills is a 67 y.o. male who presents for an Initial Medicare Annual Wellness Visit.  Visit Complete: Virtual  I connected with  Collin Mills on 06/06/23 by a audio enabled telemedicine application and verified that I am speaking with the correct person using two identifiers.  Patient Location: Other:  work  Arts administrator  I discussed the limitations of evaluation and management by telemedicine. The patient expressed understanding and agreed to proceed.  Patient Medicare AWV questionnaire was completed by the patient on 06/05/23; I have confirmed that all information answered by patient is correct and no changes since this date.  Vital Signs: Unable to obtain new vitals due to this being a telehealth visit. Patient reported height and weight.  Review of Systems     Cardiac Risk Factors include: advanced age (>56men, >54 women);male gender;dyslipidemia;hypertension;Other (see comment), Risk factor comments: pre-diabetes     Objective:    Today's Vitals   06/05/23 1940 06/06/23 1114  Weight:  193 lb (87.5 kg)  Height:  5\' 8"  (1.727 m)  PainSc: 2     Body mass index is 29.35 kg/m.     06/06/2023   11:32 AM 04/04/2023    8:19 AM 03/07/2023    8:18 AM 04/08/2019    8:36 AM 01/28/2019    8:26 AM 11/11/2018    9:38 AM 05/22/2017    8:47 AM  Advanced Directives  Does Patient Have a Medical Advance Directive? Yes Yes Yes Yes Yes Yes Yes  Type of Estate agent of [place];Living will Living will Living will Living will;Healthcare Power of [employer] Power of [place];Living will Healthcare Power of [place];Living will Healthcare Power of [place];Living will  Does patient want to make changes to medical advance directive? No - Patient declined No - Patient declined No - Patient declined      Copy of Healthcare Power of Attorney in Chart? No - copy requested     No - copy requested Yes    Current  Medications (verified) Outpatient Encounter Medications as of 06/06/2023  Medication Sig   celecoxib (CELEBREX) 200 MG capsule Take 200 mg by mouth daily.   fluticasone (FLONASE) 50 MCG/ACT nasal spray Place 2 sprays into both nostrils daily.   hydrochlorothiazide (HYDRODIURIL) 25 MG tablet TAKE 1 TABLET(25 MG) BY MOUTH DAILY   hydrocortisone 2.5 % lotion Apply topically daily as needed.   ketoconazole (NIZORAL) 2 % cream Apply topically.   omeprazole (PRILOSEC) 20 MG capsule Take 1 capsule (20 mg total) by mouth daily.   rosuvastatin (CRESTOR) 20 MG tablet Take 1 tablet (20 mg total) by mouth daily.   tadalafil (CIALIS) 20 MG tablet Take 20 mg by mouth daily as needed.   telmisartan (MICARDIS) 80 MG tablet TAKE 1 TABLET(80 MG) BY MOUTH DAILY   [DISCONTINUED] azelastine (ASTELIN) 0.1 % nasal spray Place 2 sprays into both nostrils 2 (two) times daily. Use in each nostril as directed   No facility-administered encounter medications on file as of 06/06/2023.    Allergies (verified) Molds & smuts   History: Past Medical History:  Diagnosis Date   Allergic rhinitis    Allergy    Anemia    giving blood every six weeks   Barrett's esophagus    Cataract    forming   Chickenpox    Colon polyps    GERD (gastroesophageal reflux disease)    Hyperlipidemia    Hypertension    Iron deficiency anemia 11/11/2018  Migraines    past hx in the 90's   Osteoarthritis    osteoarthritis   Vertigo    1 episode.  aprrox 2019   Wears dentures    partial upper and lower   Past Surgical History:  Procedure Laterality Date   CATARACT EXTRACTION W/PHACO Right 03/07/2023   Procedure: CATARACT EXTRACTION PHACO AND INTRAOCULAR LENS PLACEMENT (IOC) RIGHT clareon vivity toric;  Surgeon: Lockie Mola, MD;  Location: [facility];  Service: Ophthalmology;  Laterality: Right;  8.59 0:55.8   CATARACT EXTRACTION W/PHACO Left 04/04/2023   Procedure: CATARACT EXTRACTION PHACO AND INTRAOCULAR  LENS PLACEMENT (IOC) LEFT clareon vivity toric 5.39 00:29.9;  Surgeon: Lockie Mola, MD;  Location: [facility];  Service: Ophthalmology;  Laterality: Left;   COLONOSCOPY     ESOPHAGOGASTRODUODENOSCOPY ENDOSCOPY     POLYPECTOMY     SHOULDER SURGERY Right 12/09/2021   right shoulder and bicep surgery   UPPER GASTROINTESTINAL ENDOSCOPY     Family History  Problem Relation Age of Onset   Lung cancer Mother    COPD Mother    Lung cancer Father    Stroke Father    Spina bifida Sister    Cirrhosis Brother    Colon cancer Neg Hx    Esophageal cancer Neg Hx    Rectal cancer Neg Hx    Stomach cancer Neg Hx    Colon polyps Neg Hx    Social History   Socioeconomic History   Marital status: Married    Spouse name: [place]   Number of children: 1   Years of education: Not on file   Highest education level: Not on file  Occupational History   Occupation: full time IT  Tobacco Use   Smoking status: Former    Current packs/day: 0.00    Average packs/day: 1.9 packs/day for 36.0 years (67.0 ttl pk-yrs)    Types: Cigarettes    Start date: 48    Quit date: 2004    Years since quitting: 20.6   Smokeless tobacco: Never  Vaping Use   Vaping status: Never Used  Substance and Sexual Activity   Alcohol use: Yes    Alcohol/week: 5.0 standard drinks of alcohol    Types: 5 Standard drinks or equivalent per week    Comment: 2 on Friday and 3 on Saturday   Drug use: No   Sexual activity: Not on file  Other Topics Concern   Not on file  Social History Narrative   Still working full time in IT, Married no children with current wife. 1 son from previous marriage   Social Determinants of Health   Financial Resource Strain: Low Risk  (06/05/2023)   Overall Financial Resource Strain (CARDIA)    Difficulty of Paying Living Expenses: Not hard at all  Food Insecurity: No Food Insecurity (06/05/2023)   Hunger Vital Sign    Worried About Running Out of Food in the Last Year: Never  true    Ran Out of Food in the Last Year: Never true  Transportation Needs: No Transportation Needs (06/05/2023)   PRAPARE - Administrator, Civil Service (Medical): No    Lack of Transportation (Non-Medical): No  Physical Activity: Sufficiently Active (06/05/2023)   Exercise Vital Sign    Days of Exercise per Week: 7 days    Minutes of Exercise per Session: 30 min  Stress: No Stress Concern Present (06/05/2023)   [employer] of Occupational Health - Occupational Stress Questionnaire    Feeling of  Stress : Only a little  Social Connections: Moderately Isolated (06/05/2023)   Social Connection and Isolation Panel [NHANES]    Frequency of Communication with Friends and Family: Once a week    Frequency of Social Gatherings with Friends and Family: Twice a week    Attends Religious Services: Never    Database administrator or Organizations: No    Attends Engineer, structural: Never    Marital Status: Married    Tobacco Counseling Counseling given: Not Answered   Clinical Intake:  Pre-visit preparation completed: Yes  Pain : 0-10 Pain Score: 2  Pain Type: Chronic pain Pain Location: Hand Pain Descriptors / Indicators: Aching     BMI - recorded: 29.35 Nutritional Status: BMI 25 -29 Overweight Nutritional Risks: None Diabetes: No  How often do you need to have someone help you when you read instructions, pamphlets, or other written materials from your doctor or pharmacy?: 1 - Never  Interpreter Needed?: No  Information entered by :: Tora Kindred, CMA   Activities of Daily Living    06/05/2023    7:40 PM 04/04/2023    8:20 AM  In your present state of health, do you have any difficulty performing the following activities:  Hearing? 1 0  Comment sees Audiology for hearing test 06/21/23   Vision? 0 1  Difficulty concentrating or making decisions? 0 0  Walking or climbing stairs? 0 0  Dressing or bathing? 0 0  Doing errands, shopping? 0    Preparing Food and eating ? N   Using the Toilet? N   In the past six months, have you accidently leaked urine? N   Do you have problems with loss of bowel control? N   Managing your Medications? N   Managing your Finances? N   Housekeeping or managing your Housekeeping? N     Patient Care Team: Glori Luis, MD as PCP - General (Family Medicine)  Indicate any recent Medical Services you may have received from other than [facility] providers in the past year (date may be approximate).     Assessment:   This is a routine wellness examination for [place].  Hearing/Vision screen Hearing Screening - Comments:: Has appointment with Audiology on 06/21/23 Vision Screening - Comments:: Gets routine eye exams  Dietary issues and exercise activities discussed:     Goals Addressed               This Visit's Progress     Weight (lb) < 173 lb (78.5 kg) (pt-stated)   193 lb (87.5 kg)     Depression Screen    06/06/2023   11:28 AM 05/29/2023    9:51 AM 11/28/2022    8:35 AM 05/26/2022    8:52 AM 11/25/2021    3:47 PM 08/17/2021   11:39 AM 07/06/2021   11:35 AM  PHQ 2/9 Scores  PHQ - 2 Score 0 0 0 0 0 0 0  PHQ- 9 Score 0 0         Fall Risk    06/05/2023    7:40 PM 05/29/2023    9:51 AM 11/28/2022    8:35 AM 05/26/2022    8:52 AM 11/25/2021    3:47 PM  Fall Risk   Falls in the past year? 0 0 0 0 0  Number falls in past yr: 0 0 0 0 0  Injury with Fall? 0 0 0 0 0  Risk for fall due to : No Fall Risks No Fall Risks  No Fall Risks No Fall Risks No Fall Risks  Follow up Falls prevention discussed Falls evaluation completed Falls evaluation completed Falls evaluation completed Falls evaluation completed    MEDICARE RISK AT HOME:  Medicare Risk at Home - 06/05/23 1940     Grab bars in the bathroom? Yes             TIMED UP AND GO:  Was the test performed? No    Cognitive Function:        06/06/2023   11:34 AM  6CIT Screen  What Year? 0 points  What month? 0 points  What  time? 0 points  Count back from 20 0 points  Months in reverse 0 points  Repeat phrase 0 points  Total Score 0 points    Immunizations Immunization History  Administered Date(s) Administered   Covid-19, Mrna,Vaccine(Spikevax)25yrs and older 07/16/2022   Hep A / Hep B 03/11/2020, 05/11/2020, 09/14/2020   Influenza,inj,Quad PF,6+ Mos 07/15/2018, 06/16/2019, 08/02/2020, 07/06/2021   Influenza-Unspecified 07/16/2022   PFIZER(Purple Top)SARS-COV-2 Vaccination 01/13/2020, 02/03/2020, 08/30/2020   PNEUMOCOCCAL CONJUGATE-20 05/26/2022   Pfizer Covid-19 Vaccine Bivalent Booster 23yrs & up 07/07/2021   Pneumococcal Polysaccharide-23 11/02/2020   Rotavirus,unspecified  09/27/2022   Tdap 01/21/2018   Zoster Recombinant(Shingrix) 01/21/2018, 07/15/2018    TDAP status: Up to date  Flu Vaccine status: Due, Education has been provided regarding the importance of this vaccine. Advised may receive this vaccine at local pharmacy or Health Dept. Aware to provide a copy of the vaccination record if obtained from local pharmacy or Health Dept. Verbalized acceptance and understanding.  Pneumococcal vaccine status: Up to date  Covid-19 vaccine status: Information provided on how to obtain vaccines.   Qualifies for Shingles Vaccine? Yes   Zostavax completed No   Shingrix Completed?: Yes  Screening Tests Health Maintenance  Topic Date Due   Diabetic kidney evaluation - Urine ACR  Never done   COVID-19 Vaccine (6 - 2023-24 season) 11/15/2022   OPHTHALMOLOGY EXAM  01/03/2023   FOOT EXAM  05/27/2023   INFLUENZA VACCINE  01/21/2024 (Originally 05/24/2023)   HEMOGLOBIN A1C  11/17/2023   Diabetic kidney evaluation - eGFR measurement  05/16/2024   Medicare Annual Wellness (AWV)  06/05/2024   Colonoscopy  01/11/2025   DTaP/Tdap/Td (2 - Td or Tdap) 01/22/2028   Pneumonia Vaccine 16+ Years old  Completed   Hepatitis C Screening  Completed   Zoster Vaccines- Shingrix  Completed   HPV VACCINES  Aged Out     Health Maintenance  Health Maintenance Due  Topic Date Due   Diabetic kidney evaluation - Urine ACR  Never done   COVID-19 Vaccine (6 - 2023-24 season) 11/15/2022   OPHTHALMOLOGY EXAM  01/03/2023   FOOT EXAM  05/27/2023    Colorectal cancer screening: Type of screening: Colonoscopy. Completed 01/11/22. Repeat every 3 years  Lung Cancer Screening: (Low Dose CT Chest recommended if Age 70-80 years, 20 pack-year currently smoking OR have quit w/in 15years.) does not qualify.   Lung Cancer Screening Referral: n/a  Additional Screening:  Hepatitis C Screening: does not qualify; Completed 01/21/18  Vision Screening: Recommended annual ophthalmology exams for early detection of glaucoma and other disorders of the eye. Due December 2024  Dental Screening: Recommended annual dental exams for proper oral hygiene   Community Resource Referral / Chronic Care Management: CRR required this visit?  No   CCM required this visit?  No    Plan:     I have personally reviewed and noted the  following in the patient's chart:   Medical and social history Use of alcohol, tobacco or illicit drugs  Current medications and supplements including opioid prescriptions. Patient is not currently taking opioid prescriptions. Functional ability and status Nutritional status Physical activity Advanced directives List of other physicians Hospitalizations, surgeries, and ER visits in previous 12 months Vitals Screenings to include cognitive, depression, and falls Referrals and appointments  In addition, I have reviewed and discussed with patient certain preventive protocols, quality metrics, and best practice recommendations. A written personalized care plan for preventive services as well as general preventive health recommendations were provided to patient.     Tora Kindred, CMA   06/06/2023   After Visit Summary: (MyChart) Due to this being a telephonic visit, the after visit summary with  patients personalized plan was offered to patient via MyChart   Nurse Notes:  Patient has appointment with audiologist for hearing evaluation on 06/21/23. Will get flu shot in the fall. Will get RSV and covid when available.

## 2023-06-14 ENCOUNTER — Other Ambulatory Visit: Payer: Self-pay | Admitting: Family

## 2023-06-14 ENCOUNTER — Other Ambulatory Visit: Payer: Self-pay | Admitting: Family Medicine

## 2023-06-14 DIAGNOSIS — I10 Essential (primary) hypertension: Secondary | ICD-10-CM

## 2023-06-15 ENCOUNTER — Other Ambulatory Visit: Payer: Self-pay | Admitting: Medical Genetics

## 2023-06-15 DIAGNOSIS — Z006 Encounter for examination for normal comparison and control in clinical research program: Secondary | ICD-10-CM

## 2023-06-21 DIAGNOSIS — H9313 Tinnitus, bilateral: Secondary | ICD-10-CM | POA: Diagnosis not present

## 2023-06-21 DIAGNOSIS — H903 Sensorineural hearing loss, bilateral: Secondary | ICD-10-CM | POA: Diagnosis not present

## 2023-06-21 DIAGNOSIS — H6123 Impacted cerumen, bilateral: Secondary | ICD-10-CM | POA: Diagnosis not present

## 2023-07-09 DIAGNOSIS — R238 Other skin changes: Secondary | ICD-10-CM | POA: Diagnosis not present

## 2023-07-09 DIAGNOSIS — B078 Other viral warts: Secondary | ICD-10-CM | POA: Diagnosis not present

## 2023-07-09 DIAGNOSIS — D225 Melanocytic nevi of trunk: Secondary | ICD-10-CM | POA: Diagnosis not present

## 2023-07-09 DIAGNOSIS — L570 Actinic keratosis: Secondary | ICD-10-CM | POA: Diagnosis not present

## 2023-07-09 DIAGNOSIS — L821 Other seborrheic keratosis: Secondary | ICD-10-CM | POA: Diagnosis not present

## 2023-07-09 DIAGNOSIS — L218 Other seborrheic dermatitis: Secondary | ICD-10-CM | POA: Diagnosis not present

## 2023-10-08 ENCOUNTER — Other Ambulatory Visit: Payer: Self-pay | Admitting: Family Medicine

## 2023-10-08 DIAGNOSIS — K219 Gastro-esophageal reflux disease without esophagitis: Secondary | ICD-10-CM

## 2023-11-30 ENCOUNTER — Ambulatory Visit: Payer: No Typology Code available for payment source | Admitting: Family Medicine
